# Patient Record
Sex: Female | Born: 1990 | Race: Black or African American | Hispanic: No | Marital: Single | Smoking: Never smoker
Health system: Southern US, Community
[De-identification: ages and names within clinical notes are randomized; demographics above are authoritative.]

## PROBLEM LIST (undated history)

## (undated) DIAGNOSIS — F32A Depression, unspecified: Secondary | ICD-10-CM

## (undated) DIAGNOSIS — U071 COVID-19: Secondary | ICD-10-CM

## (undated) DIAGNOSIS — F4310 Post-traumatic stress disorder, unspecified: Secondary | ICD-10-CM

## (undated) HISTORY — DX: COVID-19: U07.1

## (undated) HISTORY — DX: Post-traumatic stress disorder, unspecified: F43.10

## (undated) HISTORY — PX: BREAST LUMPECTOMY: SHX2

## (undated) HISTORY — DX: Depression, unspecified: F32.A

---

## 2016-06-12 ENCOUNTER — Ambulatory Visit (INDEPENDENT_AMBULATORY_CARE_PROVIDER_SITE_OTHER): Payer: Self-pay

## 2016-06-12 ENCOUNTER — Ambulatory Visit (HOSPITAL_COMMUNITY)
Admission: EM | Admit: 2016-06-12 | Discharge: 2016-06-12 | Disposition: A | Discharge status: Home / Self Care | Payer: Self-pay | Attending: Internal Medicine | Admitting: Internal Medicine

## 2016-06-12 ENCOUNTER — Encounter (HOSPITAL_COMMUNITY): Payer: Self-pay | Admitting: Emergency Medicine

## 2016-06-12 DIAGNOSIS — M79674 Pain in right toe(s): Secondary | ICD-10-CM

## 2016-06-12 DIAGNOSIS — M79671 Pain in right foot: Secondary | ICD-10-CM

## 2016-06-12 NOTE — ED Provider Notes (Signed)
CSN: [id number]     Arrival date & time 06/12/16  1629 History   First MD Initiated Contact with Patient 06/12/16 1658     Chief Complaint  Patient presents with  . Toe Injury   (Consider location/radiation/quality/duration/timing/severity/associated sxs/prior Treatment) HPI Natalie Leblanc is a 26 y.o. female presenting to UC with c/o 4 days of pain to Right 5th toe.  Pt does not recall any specific injury but notes she may have injured it while standing on her toes to reach for something or from "walking silly" while playing with her cats.  Pain is aching and sore, 10/10, worse with palpation.  She has been taking tylenol with some relief. She also noticed some swelling to Right foot but denies pain in her foot or ankle. No other injuries.    History reviewed. No pertinent past medical history. History reviewed. No pertinent surgical history. History reviewed. No pertinent family history. Social History  Substance Use Topics  . Smoking status: Never Smoker  . Smokeless tobacco: Never Used  . Alcohol use No   OB History    No data available     Review of Systems  Musculoskeletal: Positive for arthralgias, gait problem, joint swelling and myalgias.       Right little toe  Skin: Negative for color change and wound.    Allergies  Patient has no known allergies.  Home Medications   Prior to Admission medications   Medication Sig Start Date End Date Taking? Authorizing Provider  sertraline (ZOLOFT) 100 MG tablet Take 100 mg by mouth daily.   Yes Historical Provider, MD   Meds Ordered and Administered this Visit  Medications - No data to display  BP 109/64 (BP Location: Right Arm)   Pulse 97   Temp 98.2 F (36.8 C) (Oral)   LMP 05/29/2016 (Approximate)   SpO2 99%  No data found.   Physical Exam  Constitutional: She is oriented to person, place, and time. She appears well-developed and well-nourished. No distress.  Sitting in exam chair smiling, cooperative during exam.  NAD  HENT:  Head: Normocephalic and atraumatic.  Eyes: EOM are normal.  Neck: Normal range of motion.  Cardiovascular: Normal rate.   Pulses:      Dorsalis pedis pulses are 2+ on the right side.  Pulmonary/Chest: Effort normal.  Musculoskeletal: Normal range of motion. She exhibits edema and tenderness.  Right foot: mild diffuse edema. Tenderness to Little toe. No tenderness to other toes, foot, or ankle. Full ROM ankle and toes.  Neurological: She is alert and oriented to person, place, and time.  Skin: Skin is warm and dry. Capillary refill takes less than 2 seconds. No rash noted. She is not diaphoretic. No erythema.  Right foot: skin in tact. No ecchymosis, erythema, or warmth.  Psychiatric: She has a normal mood and affect. Her behavior is normal.  Nursing note and vitals reviewed.   Urgent Care Course     Procedures (including critical care time)  Labs Review Labs Reviewed - No data to display  Imaging Review Dg Foot Complete Right  Result Date: 06/12/2016 CLINICAL DATA:  26 year old who injured the right fifth toe 4 days ago. Initial encounter. EXAM: RIGHT FOOT COMPLETE - 3+ VIEW COMPARISON:  None. FINDINGS: No evidence of acute fracture or dislocation. Benign bone island in the proximal phalanx of the fifth toe. No significant intrinsic osseous abnormalities. Well-preserved joint spaces. Well-preserved bone mineral density. IMPRESSION: No acute or significant osseous abnormality. Electronically Signed   By: Maisie Fus  Lawrence M.D.   On: 06/12/2016 17:27     MDM   1. Toe pain, right   2. Right foot pain    Pt c/o Right little toe pain. Pt states pain is 10/10, however is smiling and cooperative during exam. Declined tylenol or motrin while in UC.  Plain films: negative for fracture or dislocation  Pain likely due to mild sprain. Toes 4 & 5 strapped together using buddy taping technique Encouraged to keep taking tylenol and motrin. May use ice and elevation. F/u with  PCP in 1-2 weeks if not improving.     Junius FinnerErin O'Malley, PA-C 06/12/16 1756

## 2016-06-12 NOTE — ED Triage Notes (Signed)
Pt states she has been having right pinky toe pain since Sunday.  She states she may have injured it while standing on her toes to reach for something or from "walking silly" playing with her cats.

## 2019-05-05 ENCOUNTER — Ambulatory Visit: Payer: PRIVATE HEALTH INSURANCE | Attending: Internal Medicine

## 2019-05-05 DIAGNOSIS — Z20822 Contact with and (suspected) exposure to covid-19: Secondary | ICD-10-CM | POA: Insufficient documentation

## 2019-05-06 LAB — NOVEL CORONAVIRUS, NAA: SARS-CoV-2, NAA: NOT DETECTED

## 2019-05-12 ENCOUNTER — Telehealth: Payer: Self-pay | Admitting: General Practice

## 2019-05-12 NOTE — Telephone Encounter (Signed)
Patient called in and is requesting results be faxed to employer. Fax number is [phone], attention: Verlon Au. Email sent to manager to have sent over.

## 2019-12-21 ENCOUNTER — Other Ambulatory Visit: Payer: Self-pay

## 2019-12-22 ENCOUNTER — Encounter: Payer: Self-pay | Admitting: Internal Medicine

## 2019-12-22 ENCOUNTER — Other Ambulatory Visit: Payer: Self-pay

## 2019-12-22 ENCOUNTER — Ambulatory Visit (INDEPENDENT_AMBULATORY_CARE_PROVIDER_SITE_OTHER): Payer: PRIVATE HEALTH INSURANCE | Admitting: Internal Medicine

## 2019-12-22 VITALS — BP 98/64 | HR 100 | Temp 98.4°F | Ht 59.0 in | Wt 100.0 lb

## 2019-12-22 DIAGNOSIS — F330 Major depressive disorder, recurrent, mild: Secondary | ICD-10-CM

## 2019-12-22 DIAGNOSIS — M2170 Unequal limb length (acquired), unspecified site: Secondary | ICD-10-CM | POA: Diagnosis not present

## 2019-12-22 DIAGNOSIS — Z23 Encounter for immunization: Secondary | ICD-10-CM | POA: Diagnosis not present

## 2019-12-22 DIAGNOSIS — F4310 Post-traumatic stress disorder, unspecified: Secondary | ICD-10-CM

## 2019-12-22 DIAGNOSIS — R200 Anesthesia of skin: Secondary | ICD-10-CM

## 2019-12-22 DIAGNOSIS — F32A Depression, unspecified: Secondary | ICD-10-CM | POA: Insufficient documentation

## 2019-12-22 DIAGNOSIS — R202 Paresthesia of skin: Secondary | ICD-10-CM

## 2019-12-22 NOTE — Patient Instructions (Signed)
-  Nice seeing you today!!  -Lab work today; will notify you once results are available.  -Flu and tetanus vaccines today.  -Sports medicine referral.  -See you back in 1 year or sooner as needed.

## 2019-12-22 NOTE — Addendum Note (Signed)
Addended by: Kern Reap B on: 12/22/2019 02:23 PM   Modules accepted: Orders

## 2019-12-22 NOTE — Progress Notes (Addendum)
New Patient Office Visit     This visit occurred during the SARS-CoV-2 public health emergency.  Safety protocols were in place, including screening questions prior to the visit, additional usage of staff PPE, and extensive cleaning of exam room while observing appropriate contact time as indicated for disinfecting solutions.    CC/Reason for Visit: Establish care, discuss chronic and acute concerns Previous PCP: None Last Visit: Unknown  HPI: Natalie Leblanc is a 29 y.o. female who is coming in today for the above mentioned reasons. Past Medical History is significant for: Depression, social anxiety, PTSD followed by psychiatry on sertraline 100 mg daily as well as Minipress and hydroxyzine as needed.  She does industrial type work, she has no known drug allergies, no past surgical history, she does not smoke, she drinks alcohol occasionally.  Her family history significant for mother with hypertension and a maternal grandmother with hyperlipidemia.  She suffered an old ankle injury due to sports, she states her mother never took her to the doctor and she believes she has developed a leg length discrepancy that is now causing left-sided knee and hip pain.  She also describes burning and tingling of the soles of her foot that has been getting worse over the past couple months.  She is requesting flu and tetanus vaccines.  She is fully vaccinated against Covid.   Past Medical/Surgical History: Past Medical History:  Diagnosis Date  . Depression   . PTSD (post-traumatic stress disorder)     Past Surgical History:  Procedure Laterality Date  . BREAST LUMPECTOMY Left    benign per patient    Social History:  reports that she has never smoked. She has never used smokeless tobacco. She reports current alcohol use. She reports that she does not use drugs.  Allergies: Allergies  Allergen Reactions  . Fish Allergy     GI upset    Family History:  Family History  Problem Relation  Age of Onset  . Gestational diabetes Mother   . Alcohol abuse Mother   . Mental illness Mother   . Eczema Sister   . Depression Sister   . Psychosis Maternal Aunt   . Thyroid disease Sister   . Depression Sister   . Heart murmur Sister      Current Outpatient Medications:  .  sertraline (ZOLOFT) 100 MG tablet, Take 100 mg by mouth daily., Disp: , Rfl:   Review of Systems:  Constitutional: Denies fever, chills, diaphoresis, appetite change and fatigue.  HEENT: Denies photophobia, eye pain, redness, hearing loss, ear pain, congestion, sore throat, rhinorrhea, sneezing, mouth sores, trouble swallowing, neck pain, neck stiffness and tinnitus.   Respiratory: Denies SOB, DOE, cough, chest tightness,  and wheezing.   Cardiovascular: Denies chest pain, palpitations and leg swelling.  Gastrointestinal: Denies nausea, vomiting, abdominal pain, diarrhea, constipation, blood in stool and abdominal distention.  Genitourinary: Denies dysuria, urgency, frequency, hematuria, flank pain and difficulty urinating.  Endocrine: Denies: hot or cold intolerance, sweats, changes in hair or nails, polyuria, polydipsia. Musculoskeletal: Denies myalgias, back pain, joint swelling, arthralgias and gait problem.  Skin: Denies pallor, rash and wound.  Neurological: Denies dizziness, seizures, syncope, weakness, light-headedness, numbness and headaches.  Hematological: Denies adenopathy. Easy bruising, personal or family bleeding history  Psychiatric/Behavioral: Denies suicidal ideation, mood changes, confusion, nervousness, sleep disturbance and agitation    Physical Exam: Vitals:   12/22/19 1342  BP: 98/64  Pulse: 100  Temp: 98.4 F (36.9 C)  TempSrc: Oral  SpO2: 99%  Weight: 100 lb (45.4 kg)  Height: 4\' 11"  (1.499 m)   Body mass index is 20.2 kg/m.  Constitutional: NAD, calm, comfortable Eyes: PERRL, lids and conjunctivae normal, wears corrective lenses ENMT: Mucous membranes are moist.    Respiratory: clear to auscultation bilaterally, no wheezing, no crackles. Normal respiratory effort. No accessory muscle use.  Cardiovascular: Regular rate and rhythm, no murmurs / rubs / gallops. No extremity edema. Neurologic: Grossly intact and nonfocal Psychiatric: Normal judgment and insight. Alert and oriented x 3. Normal mood.    Impression and Plan:  Mild episode of recurrent major depressive disorder (HCC) PTSD (post-traumatic stress disorder) -Followed by psychiatry on daily sertraline and as needed Minipress and hydroxyzine. -Mood is stable.   Numbness and tingling of both feet  - Plan: TSH, Vitamin B12, VITAMIN D 25 Hydroxy (Vit-D Deficiency, Fractures)  Leg length discrepancy -I will refer to sports medicine, they may be able to put a lift in her shoe.  Need for influenza vaccination -Flu vaccine administered today.  Need for tetanus booster -Tdap administered today.    Patient Instructions  -Nice seeing you today!!  -Lab work today; will notify you once results are available.  -Flu and tetanus vaccines today.  -Sports medicine referral.  -See you back in 1 year or sooner as needed.       , MD [facility]

## 2019-12-23 ENCOUNTER — Other Ambulatory Visit: Payer: Self-pay | Admitting: Internal Medicine

## 2019-12-23 ENCOUNTER — Encounter: Payer: Self-pay | Admitting: Internal Medicine

## 2019-12-23 DIAGNOSIS — E559 Vitamin D deficiency, unspecified: Secondary | ICD-10-CM

## 2019-12-23 LAB — TSH: TSH: 1.14 mIU/L

## 2019-12-23 LAB — VITAMIN D 25 HYDROXY (VIT D DEFICIENCY, FRACTURES): Vit D, 25-Hydroxy: 21 ng/mL — ABNORMAL LOW (ref 30–100)

## 2019-12-23 LAB — VITAMIN B12: Vitamin B-12: 866 pg/mL (ref 200–1100)

## 2019-12-23 MED ORDER — VITAMIN D (ERGOCALCIFEROL) 1.25 MG (50000 UNIT) PO CAPS: 50000.0000 [IU] | ORAL_CAPSULE | ORAL | 0 refills | Status: AC

## 2019-12-28 ENCOUNTER — Encounter: Payer: Self-pay | Admitting: Family Medicine

## 2019-12-28 ENCOUNTER — Ambulatory Visit (INDEPENDENT_AMBULATORY_CARE_PROVIDER_SITE_OTHER): Payer: PRIVATE HEALTH INSURANCE | Admitting: Family Medicine

## 2019-12-28 ENCOUNTER — Ambulatory Visit: Payer: Self-pay

## 2019-12-28 ENCOUNTER — Other Ambulatory Visit: Payer: Self-pay

## 2019-12-28 ENCOUNTER — Ambulatory Visit (INDEPENDENT_AMBULATORY_CARE_PROVIDER_SITE_OTHER): Payer: PRIVATE HEALTH INSURANCE

## 2019-12-28 VITALS — BP 96/64 | HR 91 | Ht 59.0 in | Wt 101.0 lb

## 2019-12-28 DIAGNOSIS — M25572 Pain in left ankle and joints of left foot: Secondary | ICD-10-CM | POA: Diagnosis not present

## 2019-12-28 DIAGNOSIS — G8929 Other chronic pain: Secondary | ICD-10-CM

## 2019-12-28 DIAGNOSIS — M76822 Posterior tibial tendinitis, left leg: Secondary | ICD-10-CM | POA: Diagnosis not present

## 2019-12-28 NOTE — Progress Notes (Signed)
Subjective:    I'm seeing this patient as a consultation for:  Natalie Leblanc. Note will be routed back to referring provider/PCP.  CC: L ankle  I, Natalie Leblanc, LAT, ATC, am serving as scribe for Natalie Leblanc.  HPI: Pt is a 29 y/o female presenting w/ c/o chronic L ankle pain after having multiple ankle injuries in HS and college.  She is not having pain currently in her L ankle but when she does have pain it's along her L medial rearfoot.  She locates the pain to the navicular prominence at her left midfoot.  She has tried some over-the-counter ankle braces in the past with some benefit.  She has never had trial of physical therapy.  Swelling: yes, intermittently in both feet Aggravating factors: quick motions of her foot and ankle; prolonged standing and walking; Treatments tried: Nothing  Past medical history, Surgical history, Family history, Social history, Allergies, and medications have been entered into the medical record, reviewed.   Review of Systems: No new headache, visual changes, nausea, vomiting, diarrhea, constipation, dizziness, abdominal pain, skin rash, fevers, chills, night sweats, weight loss, swollen lymph nodes, body aches, joint swelling, muscle aches, chest pain, shortness of breath, mood changes, visual or auditory hallucinations.   Objective:    Vitals:   12/28/19 1343  BP: 96/64  Pulse: 91  SpO2: 98%   General: Well Developed, well nourished, and in no acute distress.  Neuro/Psych: Alert and oriented x3, extra-ocular muscles intact, able to move all 4 extremities, sensation grossly intact. Skin: Warm and dry, no rashes noted.  Respiratory: Not using accessory muscles, speaking in full sentences, trachea midline.  Cardiovascular: Pulses palpable, no extremity edema. Abdomen: Does not appear distended. MSK: Feet bilaterally with pronation.  Left foot otherwise normal-appearing Tender palpation mildly at navicular prominence.  Some pain with  resisted foot inversion. Stable ligamentous exam.  Pulses cap refill and sensation are intact distally.  Lab and Radiology Results X-ray images left foot obtained today personally and independently reviewed Normal-appearing midfoot with no significant degenerative changes or fractures. Await formal radiology review  Diagnostic Limited MSK Ultrasound of: Left medial foot and ankle Normal-appearing posterior tibialis tendon at ankle and to insertion.  Insertion of the navicular prominence slight cortical defect present with increased Doppler activity indicating cortical irritation at insertion site. 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 female with left posterior tibialis tendinitis at insertion.  This is compounded by pronation deformity bilaterally.  Plan to treat multifactorially with Voltaren gel compressive ankle sleeve scaphoid pads and home eccentric exercises taught in clinic today by ATC.  Recheck in 1 month.  If not better would proceed with physical therapy and probably nitroglycerin patch protocol.  Consider MRI in future if needed.Marland Kitchen  PDMP not reviewed this encounter. Orders Placed This Encounter  Procedures  . [place] LIMITED JOINT SPACE STRUCTURES LOW LEFT(NO LINKED CHARGES)    Order Specific Question:   Reason for Exam (SYMPTOM  OR DIAGNOSIS REQUIRED)    Answer:   L medial foot pain    Order Specific Question:   Preferred imaging location?    Answer:   [facility]  . DG Foot Complete Left    Standing Status:   Future    Number of Occurrences:   1    Standing Expiration Date:   12/27/2020    Order Specific Question:   Reason for Exam (SYMPTOM  OR DIAGNOSIS REQUIRED)    Answer:   eval foot pain navicular    Order Specific Question:   Is patient pregnant?    Answer:   No    Order Specific Question:   Preferred imaging location?    Answer:    [facility]    Order Specific Question:   Radiology Contrast Protocol - do NOT remove file path    Answer:   [email or web address]   No orders of the defined types were placed in this encounter.   Discussed warning signs or symptoms. Please see discharge instructions. Patient expresses understanding.   The above documentation has been reviewed and is accurate and complete Clementeen Leblanc, M.D.

## 2019-12-28 NOTE — Patient Instructions (Addendum)
Thank you for coming in today.  Please get an Xray today before you leave  Use scaphoid pads  (small) from [employer]pad.com  Please use voltaren gel up to 4x daily for pain as needed.   Please perform the exercise program that we have prepared for you and gone over in detail on a daily basis.  In addition to the handout you were provided you can access your program through: [email or web address]-exercise-[email or web address]   Your unique program code is:  [id number]  I recommend you obtained a compression sleeve to help with your joint problems. There are many options on the market however I recommend obtaining a full ankle Body Helix compression sleeve.  You can find information (including how to appropriate measure yourself for sizing) can be found at www.Body [email or web address].  Many of these products are health savings account (HSA) eligible.   Recheck in about 1 month.   If not better can proceed to physical therapy and possibly get MRI.

## 2019-12-30 NOTE — Progress Notes (Signed)
X-ray left foot looks normal to radiology

## 2020-01-27 ENCOUNTER — Ambulatory Visit: Payer: PRIVATE HEALTH INSURANCE | Admitting: Family Medicine

## 2020-01-27 NOTE — Progress Notes (Deleted)
   I, Natalie Leblanc, LAT, ATC, am serving as scribe for Dr. [name].  Natalie Leblanc is a 29 y.o. female who presents to [facility] today for f/u of L ankle pain.  She was last seen by Dr. Denyse Leblanc on 12/28/19 and was provided scaphoid pads, a HEP consisting of ankle inversion eccentrics and advised to use Voltaren gel.  Since her last visit, pt reports  Diagnostic testing: L foot XR- 12/28/19   Pertinent review of systems: ***  Relevant historical information: ***   Exam:  There were no vitals taken for this visit. General: Well Developed, well nourished, and in no acute distress.   MSK: ***    Lab and Radiology Results No results found for this or any previous visit (from the past 72 hour(s)). No results found.     Assessment and Plan: 29 y.o. female with ***   PDMP not reviewed this encounter. No orders of the defined types were placed in this encounter.  No orders of the defined types were placed in this encounter.    Discussed warning signs or symptoms. Please see discharge instructions. Patient expresses understanding.   ***

## 2020-02-01 ENCOUNTER — Ambulatory Visit: Payer: PRIVATE HEALTH INSURANCE | Admitting: Family Medicine

## 2020-02-01 NOTE — Progress Notes (Deleted)
   I, [name], LAT, ATC, am serving as scribe for Dr. [name].  Natalie Leblanc is a 29 y.o. female who presents to [facility] today for f/u of L medial midfoot pain.  She was last seen by Dr. Denyse Amass on 12/28/19 and was provided a HEP focusing on ankle inversion eccentrics.  She was also given scaphoid pads for her shoes and advised to use Voltaren gel.  Since her last visit, pt reports  Diagnostic testing: L foot XR- 12/28/19   Pertinent review of systems: ***  Relevant historical information: ***   Exam:  There were no vitals taken for this visit. General: Well Developed, well nourished, and in no acute distress.   MSK: ***    Lab and Radiology Results No results found for this or any previous visit (from the past 72 hour(s)). No results found.     Assessment and Plan: 29 y.o. female with ***   PDMP not reviewed this encounter. No orders of the defined types were placed in this encounter.  No orders of the defined types were placed in this encounter.    Discussed warning signs or symptoms. Please see discharge instructions. Patient expresses understanding.   ***

## 2020-03-22 ENCOUNTER — Other Ambulatory Visit: Payer: Self-pay | Admitting: Internal Medicine

## 2020-03-22 DIAGNOSIS — E559 Vitamin D deficiency, unspecified: Secondary | ICD-10-CM

## 2020-05-06 ENCOUNTER — Emergency Department (HOSPITAL_COMMUNITY): Payer: No Typology Code available for payment source

## 2020-05-06 ENCOUNTER — Encounter (HOSPITAL_COMMUNITY): Payer: Self-pay | Admitting: Emergency Medicine

## 2020-05-06 ENCOUNTER — Emergency Department (HOSPITAL_COMMUNITY)
Admission: EM | Admit: 2020-05-06 | Discharge: 2020-05-06 | Disposition: A | Discharge status: Home / Self Care | Payer: No Typology Code available for payment source | Attending: Emergency Medicine | Admitting: Emergency Medicine

## 2020-05-06 ENCOUNTER — Other Ambulatory Visit: Payer: Self-pay

## 2020-05-06 DIAGNOSIS — Z20822 Contact with and (suspected) exposure to covid-19: Secondary | ICD-10-CM | POA: Diagnosis not present

## 2020-05-06 DIAGNOSIS — R079 Chest pain, unspecified: Secondary | ICD-10-CM | POA: Diagnosis present

## 2020-05-06 DIAGNOSIS — R0789 Other chest pain: Secondary | ICD-10-CM | POA: Insufficient documentation

## 2020-05-06 LAB — CBC
HCT: 42.1 % (ref 36.0–46.0)
Hemoglobin: 13.2 g/dL (ref 12.0–15.0)
MCH: 28 pg (ref 26.0–34.0)
MCHC: 31.4 g/dL (ref 30.0–36.0)
MCV: 89.4 fL (ref 80.0–100.0)
Platelets: 270 10*3/uL (ref 150–400)
RBC: 4.71 MIL/uL (ref 3.87–5.11)
RDW: 14 % (ref 11.5–15.5)
WBC: 5.1 10*3/uL (ref 4.0–10.5)
nRBC: 0 % (ref 0.0–0.2)

## 2020-05-06 LAB — BASIC METABOLIC PANEL
Anion gap: 9 (ref 5–15)
BUN: 16 mg/dL (ref 6–20)
CO2: 25 mmol/L (ref 22–32)
Calcium: 9.4 mg/dL (ref 8.9–10.3)
Chloride: 103 mmol/L (ref 98–111)
Creatinine, Ser: 0.86 mg/dL (ref 0.44–1.00)
GFR, Estimated: >60 mL/min (ref >60)
Glucose, Bld: 87 mg/dL (ref 70–99)
Potassium: 3.7 mmol/L (ref 3.5–5.1)
Sodium: 137 mmol/L (ref 135–145)

## 2020-05-06 LAB — I-STAT BETA HCG BLOOD, ED (NOT ORDERABLE): I-stat hCG, quantitative: <5 m[IU]/mL (ref <5)

## 2020-05-06 LAB — TROPONIN I (HIGH SENSITIVITY): Troponin I (High Sensitivity): <2 ng/L (ref <18)

## 2020-05-06 NOTE — ED Provider Notes (Signed)
[facility]   CSN: [id number] Arrival date & time: 05/06/20  1549     History Chief Complaint  Patient presents with  . Chest Pain    Natalie Leblanc is a 30 y.o. female with past medical history significant for depression, PTSD.  Had COVID vaccinations.  HPI Patient presents to emergency department today with chief complaint of chest pain x3 days.  Chest pain has been intermittent.  It is central and she describes it as a squeezing sensation of her heart.  She states she has a history of anxiety however this pain feels different and she denies any increased anxiety currently..  She denies worsening pain with exertion although states that when she has the pain she feels short of breath.  She has tried multiple interventions for chest pain without symptom improvement.  At first she thought she was hungry so she tried eating a small meal.  She then tried taking Tums.  She has been taking ibuprofen.  She rates the pain a 6 out of 10 in severity.  She occasionally drinks a wine cooler, states she has only had a 1 in the last week.  Denies any drug use.  She does admit to lifting heavy boxes at work in the last week in her bilateral arms feel sore.  She states multiple coworkers have been out with Covid however does not think she has had close contact with anyone that is Covid positive.  She denies any recent illness. Denies any family history of cardiac disease.  She denies any fever, chills, cough, hemoptysis, syncope, palpitations, abdominal pain, nausea or emesis, back pain.  Denies PE risk factors including  exogenous estrogen use, lower extremity pain or swelling, recent travel or immobilization, history of PE or DVT, family or personal history of bleeding or clotting disorders, cough or hemoptysis.     Past Medical History:  Diagnosis Date  . Depression   . PTSD (post-traumatic stress disorder)     Patient Active Problem List    Diagnosis Date Noted  . 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 12/28/2019  . Vitamin D deficiency 12/23/2019  . Depression   . PTSD (post-traumatic stress disorder)     Past Surgical History:  Procedure Laterality Date  . BREAST LUMPECTOMY Left    benign per patient     OB History   No obstetric history on file.     Family History  Problem Relation Age of Onset  . Gestational diabetes Mother   . Alcohol abuse Mother   . Mental illness Mother   . Eczema Sister   . Depression Sister   . Psychosis Maternal Aunt   . Thyroid disease Sister   . Depression Sister   . Heart murmur Sister     Social History   Tobacco Use  . Smoking status: Never Smoker  . Smokeless tobacco: Never Used  Vaping Use  . Vaping Use: Never used  Substance Use Topics  . Alcohol use: Yes    Comment: occasional  . Drug use: No    Home Medications Prior to Admission medications   Medication Sig Start Date End Date Taking? Authorizing Provider  sertraline (ZOLOFT) 100 MG tablet Take 100 mg by mouth daily.    [provider]    Allergies    Fish allergy  Review of Systems   Review of Systems All other systems are reviewed and are negative for acute change except as noted in the HPI.  Physical Exam Updated Vital Signs BP 112/80 (BP Location: Left Arm)   Pulse 70   Temp 98.1 F (36.7 C) (Oral)   Resp 16   LMP 04/28/2020   SpO2 100%   Physical Exam Vitals and nursing note reviewed.  Constitutional:      General: She is not in acute distress.    Appearance: She is not ill-appearing.  HENT:     Head: Normocephalic and atraumatic.     Right Ear: Tympanic membrane and external ear normal.     Left Ear: Tympanic membrane and external ear normal.     Nose: Nose normal.     Mouth/Throat:     Mouth: Mucous membranes are moist.     Pharynx: Oropharynx is clear.  Eyes:     General: No scleral icterus.       Right eye: No discharge.        Left eye: No discharge.      Extraocular Movements: Extraocular movements intact.     Conjunctiva/sclera: Conjunctivae normal.     Pupils: Pupils are equal, round, and reactive to light.  Neck:     Vascular: No JVD.  Cardiovascular:     Rate and Rhythm: Normal rate and regular rhythm.     Pulses: Normal pulses.          Radial pulses are 2+ on the right side and 2+ on the left side.     Heart sounds: Normal heart sounds.  Pulmonary:     Comments: Lungs clear to auscultation in all fields. Symmetric chest rise. No wheezing, rales, or rhonchi. Chest:     Chest wall: Tenderness present.  Abdominal:     Comments: Abdomen is soft, non-distended, and non-tender in all quadrants. No rigidity, no guarding. No peritoneal signs.  Musculoskeletal:        General: Normal range of motion.     Cervical back: Normal range of motion.     Right lower leg: No edema.     Left lower leg: No edema.  Skin:    General: Skin is warm and dry.     Capillary Refill: Capillary refill takes less than 2 seconds.  Neurological:     Mental Status: She is oriented to person, place, and time.     GCS: GCS eye subscore is 4. GCS verbal subscore is 5. GCS motor subscore is 6.     Comments: Fluent speech, no facial droop.  Psychiatric:        Behavior: Behavior normal.     ED Results / Procedures / Treatments   Labs (all labs ordered are listed, but only abnormal results are displayed) Labs Reviewed  SARS CORONAVIRUS 2 (TAT 6-24 HRS)  BASIC METABOLIC PANEL  CBC  I-STAT BETA HCG BLOOD, ED ([facility], [facility], [facility] ONLY)  I-STAT BETA HCG BLOOD, ED (NOT ORDERABLE)  TROPONIN I (HIGH SENSITIVITY)    EKG None  Radiology DG Chest 2 View  Result Date: 05/06/2020 CLINICAL DATA:  Chest pain with shortness of breath for 3 days. EXAM: CHEST - 2 VIEW COMPARISON:  None. FINDINGS: Heart size and mediastinal contours are within normal limits. Lungs are clear. No pleural effusion or pneumothorax is seen. Osseous structures about the chest are unremarkable.  IMPRESSION: Normal chest x-ray. Electronically Signed   By: Bary Richard M.D.   On: 05/06/2020 16:27    Procedures Procedures   Medications Ordered in ED Medications - No data to display  ED Course  I have reviewed the triage vital  signs and the nursing notes.  Pertinent labs & imaging results that were available during my care of the patient were reviewed by me and considered in my medical decision making (see chart for details).    MDM Rules/Calculators/A&P                          History provided by patient with additional history obtained from chart review.    Patient presents to the emergency department with chest pain. Patient nontoxic appearing, in no apparent distress, vitals without significant abnormality. Fairly benign physical exam. DDX: ACS, pulmonary embolism, dissection, pneumothorax, effusion, infiltrate, arrhythmia, anemia, electrolyte derangement, MSK. Evaluation initiated with labs, EKG, and CXR. Patient on cardiac monitor.   Work-up in the ER unremarkable. Labs reviewed, no leukocytosis, anemia, or significant electrolyte abnormality.  Pregnancy test is negative.  CXR without infiltrate, effusion, pneumothorax, or fracture/dislocation.   Low risk heart score of 0, EKG without obvious ischemia, troponin negative, doubt ACS. Patient is low risk wells, PERC negative, doubt pulmonary embolism. Pain is not a tearing sensation, symmetric pulses, no widening of mediastinum on CXR, doubt dissection. Cardiac monitor reviewed, no notable arrhythmias or tachycardia.  She has multiple coworkers with Covid, Covid test performed and is process at time of discharge.  Recommended she isolate until she has test result.  Discussed treatment if test is positive. She declines need for NSAID or anything for pain while in the ED. Patient has appeared hemodynamically stable throughout ER visit and appears safe for discharge with close pcp follow up. I discussed results, treatment plan, need  for PCP and cardiology  follow-up, and return precautions with the patient. Provided opportunity for questions, patient confirmed understanding and is in agreement with plan.  The patient was discussed with and seen by ED supervising physician Dr. Rodena Medin who agrees with the treatment plan.    Portions of this note were generated with Scientist, clinical (histocompatibility and immunogenetics). Dictation errors may occur despite best attempts at proofreading.    Final Clinical Impression(s) / ED Diagnoses Final diagnoses:  Atypical chest pain    Rx / DC Orders ED Discharge Orders    None       Kandice Hams 05/06/20 1913    Wynetta Fines, MD 05/06/20 2317

## 2020-05-06 NOTE — ED Triage Notes (Signed)
Patient c/o intermittent central chest pain with SOB x3 days. Reports hx chest pains with stress but states she is not stressed at this time.

## 2020-05-06 NOTE — Discharge Instructions (Signed)
-  Your Covid test is in process.  You should isolate until you have the test result.  If it is positive you need to quarantine per CDC guidelines.  If it is negative you are free to return to normal activity.  -Follow-up with your primary care doctor and cardiology.  I given you the information for [facility].  You can call their office to schedule follow-up appointment.  -You can continue taking ibuprofen to see if that helps your pain.  Take as directed on the bottle.  Return to the emergency room if you have any new or worsening symptoms.

## 2020-05-07 LAB — SARS CORONAVIRUS 2 (TAT 6-24 HRS): SARS Coronavirus 2: NEGATIVE

## 2020-05-11 ENCOUNTER — Encounter: Payer: Self-pay | Admitting: Internal Medicine

## 2020-05-11 ENCOUNTER — Telehealth (INDEPENDENT_AMBULATORY_CARE_PROVIDER_SITE_OTHER): Payer: PRIVATE HEALTH INSURANCE | Admitting: Internal Medicine

## 2020-05-11 VITALS — Wt 100.0 lb

## 2020-05-11 DIAGNOSIS — K219 Gastro-esophageal reflux disease without esophagitis: Secondary | ICD-10-CM

## 2020-05-11 DIAGNOSIS — Z09 Encounter for follow-up examination after completed treatment for conditions other than malignant neoplasm: Secondary | ICD-10-CM | POA: Diagnosis not present

## 2020-05-11 DIAGNOSIS — R079 Chest pain, unspecified: Secondary | ICD-10-CM

## 2020-05-11 MED ORDER — PANTOPRAZOLE SODIUM 40 MG PO TBEC: 40.0000 mg | DELAYED_RELEASE_TABLET | Freq: Every day | ORAL | 1 refills | Status: DC

## 2020-05-11 NOTE — Progress Notes (Signed)
Virtual Visit via Telephone Note  I connected with Natalie Leblanc on 05/11/20 at  3:30 PM EST by telephone and verified that I am speaking with the correct person using two identifiers.   I discussed the limitations, risks, security and privacy concerns of performing an evaluation and management service by telephone and the availability of in person appointments. I also discussed with the patient that there may be a patient responsible charge related to this service. The patient expressed understanding and agreed to proceed.  Location patient: home Location provider: work office Participants present for the call: patient, provider Patient did not have a visit in the prior 7 days to address this/these issue(s).   History of Present Illness:  She was seen in the emergency department on 1/30 with complaints of 3 days of chest pain.  Pain has been intermittent.  She tells me today that the pain is worse when she is hungry or lying flat.  Feels like a "gnawing pain".  She has a history of anxiety but does not feel like this is acting up at this time.  She tried over-the-counter Tums without relief.  She has been taking ibuprofen.  Work-up in the ED was negative including chest x-ray, EKG, troponins and labs.  It was recommended that she follow-up with me.   Observations/Objective: Patient sounds cheerful and well on the phone. I do not appreciate any increased work of breathing. Speech and thought processing are grossly intact. Patient reported vitals: None reported   Current Outpatient Medications:  .  pantoprazole (PROTONIX) 40 MG tablet, Take 1 tablet (40 mg total) by mouth daily., Disp: 90 tablet, Rfl: 1 .  sertraline (ZOLOFT) 100 MG tablet, Take 100 mg by mouth daily., Disp: , Rfl:   Review of Systems:  Constitutional: Denies fever, chills, diaphoresis, appetite change and fatigue.  HEENT: Denies photophobia, eye pain, redness, hearing loss, ear pain, congestion, sore  throat, rhinorrhea, sneezing, mouth sores, trouble swallowing, neck pain, neck stiffness and tinnitus.   Respiratory: Denies SOB, DOE, cough  and wheezing.   Cardiovascular: Denies palpitations and leg swelling.  Gastrointestinal: Denies nausea, vomiting, abdominal pain, diarrhea, constipation, blood in stool and abdominal distention.  Genitourinary: Denies dysuria, urgency, frequency, hematuria, flank pain and difficulty urinating.  Endocrine: Denies: hot or cold intolerance, sweats, changes in hair or nails, polyuria, polydipsia. Musculoskeletal: Denies myalgias, back pain, joint swelling, arthralgias and gait problem.  Skin: Denies pallor, rash and wound.  Neurological: Denies dizziness, seizures, syncope, weakness, light-headedness, numbness and headaches.  Hematological: Denies adenopathy. Easy bruising, personal or family bleeding history  Psychiatric/Behavioral: Denies suicidal ideation, mood changes, confusion, nervousness, sleep disturbance and agitation   Assessment and Plan:  Hospital discharge follow-up  Chest pain, unspecified type  Gastroesophageal reflux disease without esophagitis  -I think that pretest probability for cardiac disease would be very low in this young lady without any personal or family risk factors who had a completely normal cardiac work-up in the ED.  I believe referral to cardiology that she is requesting is probably not necessary at this time although we can consider if issues persist. -Description of pain makes me believe that it might be GERD.  We will do an 8-week trial of Protonix 40 mg daily to see if this alleviates her symptoms. -She wonders about gallbladder as her mother had similar issues and ended up having a cholecystectomy, this remains in the differential as well although not highly suspicious for at this time.   I discussed the  assessment and treatment plan with the patient. The patient was provided an opportunity to ask questions and all  were answered. The patient agreed with the plan and demonstrated an understanding of the instructions.   The patient was advised to call back or seek an in-person evaluation if the symptoms worsen or if the condition fails to improve as anticipated.  I provided 21 minutes of non-face-to-face time during this encounter.   Chaya Jan, MD [facility] at [facility]

## 2020-12-24 ENCOUNTER — Other Ambulatory Visit: Payer: Self-pay

## 2020-12-25 ENCOUNTER — Ambulatory Visit (INDEPENDENT_AMBULATORY_CARE_PROVIDER_SITE_OTHER): Payer: PRIVATE HEALTH INSURANCE | Admitting: Internal Medicine

## 2020-12-25 ENCOUNTER — Encounter: Payer: Self-pay | Admitting: Internal Medicine

## 2020-12-25 ENCOUNTER — Other Ambulatory Visit (HOSPITAL_COMMUNITY)
Admission: RE | Admit: 2020-12-25 | Discharge: 2020-12-25 | Disposition: A | Discharge status: Home / Self Care | Payer: 59 | Source: Ambulatory Visit | Attending: Internal Medicine | Admitting: Internal Medicine

## 2020-12-25 VITALS — BP 102/68 | HR 68 | Temp 98.4°F | Ht 59.5 in | Wt 103.9 lb

## 2020-12-25 DIAGNOSIS — Z124 Encounter for screening for malignant neoplasm of cervix: Secondary | ICD-10-CM | POA: Diagnosis present

## 2020-12-25 DIAGNOSIS — F330 Major depressive disorder, recurrent, mild: Secondary | ICD-10-CM | POA: Diagnosis not present

## 2020-12-25 DIAGNOSIS — Z23 Encounter for immunization: Secondary | ICD-10-CM

## 2020-12-25 DIAGNOSIS — F4310 Post-traumatic stress disorder, unspecified: Secondary | ICD-10-CM | POA: Diagnosis not present

## 2020-12-25 DIAGNOSIS — Z0000 Encounter for general adult medical examination without abnormal findings: Secondary | ICD-10-CM

## 2020-12-25 DIAGNOSIS — K219 Gastro-esophageal reflux disease without esophagitis: Secondary | ICD-10-CM | POA: Diagnosis not present

## 2020-12-25 NOTE — Progress Notes (Signed)
Established Patient Office Visit     This visit occurred during the SARS-CoV-2 public health emergency.  Safety protocols were in place, including screening questions prior to the visit, additional usage of staff PPE, and extensive cleaning of exam room while observing appropriate contact time as indicated for disinfecting solutions.    CC/Reason for Visit: Annual preventive exam  HPI: Natalie Leblanc is a 30 y.o. female who is coming in today for the above mentioned reasons. Past Medical History is significant for: Depression and posttraumatic stress followed by psychiatry maintained on sertraline.  She had an emergency department visit earlier this year for chest pain, was discharged after negative work-up.  At her follow-up visit we had determined that it was likely GERD based on symptoms.  She only took Protonix for a few weeks and discontinued.  She prefers not to take medication if possible.  She states that she still has this epigastric/chest pain that is worse when lying down flat and feels like a " gnawing sensation".  She has routine dental care but no eye care, she is overdue for Pap smear.  She is due for flu and COVID booster.   Past Medical/Surgical History: Past Medical History:  Diagnosis Date   Depression    PTSD (post-traumatic stress disorder)     Past Surgical History:  Procedure Laterality Date   BREAST LUMPECTOMY Left    benign per patient    Social History:  reports that she has never smoked. She has never used smokeless tobacco. She reports current alcohol use. She reports that she does not use drugs.  Allergies: Allergies  Allergen Reactions   Animal Chews Itching   Fish Allergy     GI upset    Family History:  Family History  Problem Relation Age of Onset   Gestational diabetes Mother    Alcohol abuse Mother    Mental illness Mother    Eczema Sister    Depression Sister    Psychosis Maternal Aunt    Thyroid disease Sister     Depression Sister    Heart murmur Sister      Current Outpatient Medications:    sertraline (ZOLOFT) 100 MG tablet, Take 100 mg by mouth daily., Disp: , Rfl:    Multiple Vitamin (MULTIVITAMIN) capsule, Take 1 capsule by mouth daily., Disp: , Rfl:    pantoprazole (PROTONIX) 40 MG tablet, Take 1 tablet (40 mg total) by mouth daily. (Patient not taking: Reported on 12/25/2020), Disp: 90 tablet, Rfl: 1  Review of Systems:  Constitutional: Denies fever, chills, diaphoresis, appetite change and fatigue.  HEENT: Denies photophobia, eye pain, redness, hearing loss, ear pain, congestion, sore throat, rhinorrhea, sneezing, mouth sores, trouble swallowing, neck pain, neck stiffness and tinnitus.   Respiratory: Denies SOB, DOE, cough, chest tightness,  and wheezing.   Cardiovascular: Denies chest pain, palpitations and leg swelling.  Gastrointestinal: Denies diarrhea, constipation, blood in stool and abdominal distention.  Genitourinary: Denies dysuria, urgency, frequency, hematuria, flank pain and difficulty urinating.  Endocrine: Denies: hot or cold intolerance, sweats, changes in hair or nails, polyuria, polydipsia. Musculoskeletal: Denies myalgias, back pain, joint swelling, arthralgias and gait problem.  Skin: Denies pallor, rash and wound.  Neurological: Denies dizziness, seizures, syncope, weakness, light-headedness, numbness and headaches.  Hematological: Denies adenopathy. Easy bruising, personal or family bleeding history  Psychiatric/Behavioral: Denies suicidal ideation, mood changes, confusion, nervousness, sleep disturbance and agitation    Physical Exam: Vitals:   12/25/20 1032  BP: 102/68  Pulse: 68  Temp: 98.4 F (36.9 C)  TempSrc: Oral  SpO2: 99%  Weight: 103 lb 14.4 oz (47.1 kg)  Height: 4' 11.5" (1.511 m)    Body mass index is 20.63 kg/m.   Constitutional: NAD, calm, comfortable Eyes: PERRL, lids and conjunctivae normal ENMT: Mucous membranes are moist.   Respiratory: clear to auscultation bilaterally, no wheezing, no crackles. Normal respiratory effort. No accessory muscle use.  Cardiovascular: Regular rate and rhythm, no murmurs / rubs / gallops. No extremity edema. 2+ pedal pulses. No carotid bruits.  Abdomen: no tenderness, no masses palpated. No hepatosplenomegaly. Bowel sounds positive.  Musculoskeletal: no clubbing / cyanosis. No joint deformity upper and lower extremities. Good ROM, no contractures. Normal muscle tone.  Skin: no rashes, lesions, ulcers. No induration Neurologic: CN 2-12 grossly intact. Sensation intact, DTR normal. Strength 5/5 in all 4.  Psychiatric: Normal judgment and insight. Alert and oriented x 3. Normal mood.    Impression and Plan:  Encounter for preventive health examination -She has routine dental care, have advised routine eye care. -She will get flu vaccine today, she is overdue COVID booster which she will need to get at pharmacy. -Healthy lifestyle discussed in detail. -Pap smear done in office today. -Commence routine breast cancer screening age 54 and colon cancer screening age 47  Mild episode of recurrent major depressive disorder (HCC) PTSD (post-traumatic stress disorder) -Followed by psychiatry on sertraline.  Gastroesophageal reflux disease without esophagitis  - Plan: Ambulatory referral to Gastroenterology -She declines PPI therapy.  Need for influenza vaccination -Flu vaccine administered today.    Patient Instructions  -Nice seeing you today!!  -Flu vaccine today.  -Schedule follow up in 1 year or sooner as needed.     Chaya Jan, MD [facility] at [facility]

## 2020-12-25 NOTE — Patient Instructions (Signed)
-  Nice seeing you today!!  -Flu vaccine today.  -Schedule follow up in 1 year or sooner as needed.   

## 2020-12-25 NOTE — Addendum Note (Signed)
Addended by: Kern Reap B on: 12/25/2020 11:53 AM   Modules accepted: Orders

## 2020-12-25 NOTE — Addendum Note (Signed)
Addended by: Kern Reap B on: 12/25/2020 05:02 PM   Modules accepted: Orders

## 2020-12-26 LAB — CYTOLOGY - PAP
Adequacy: ABSENT
Comment: NEGATIVE
Diagnosis: UNDETERMINED — AB
High risk HPV: NEGATIVE

## 2020-12-27 ENCOUNTER — Encounter: Payer: Self-pay | Admitting: Internal Medicine

## 2021-02-12 ENCOUNTER — Encounter (HOSPITAL_COMMUNITY): Payer: Self-pay

## 2021-02-12 ENCOUNTER — Ambulatory Visit (HOSPITAL_COMMUNITY)
Admission: EM | Admit: 2021-02-12 | Discharge: 2021-02-12 | Disposition: A | Discharge status: Home / Self Care | Payer: 59 | Attending: Urgent Care | Admitting: Urgent Care

## 2021-02-12 ENCOUNTER — Other Ambulatory Visit: Payer: Self-pay

## 2021-02-12 DIAGNOSIS — L249 Irritant contact dermatitis, unspecified cause: Secondary | ICD-10-CM | POA: Diagnosis not present

## 2021-02-12 MED ORDER — TRIAMCINOLONE ACETONIDE 0.1 % EX CREA: 1.0000 "application " | TOPICAL_CREAM | Freq: Two times a day (BID) | CUTANEOUS | 0 refills | Status: DC

## 2021-02-12 NOTE — ED Provider Notes (Signed)
[facility]   MRN: [id number] DOB: 12/19/1990  Subjective:   Natalie Leblanc is a 30 y.o. female presenting for 1 week history of persistent itchy spot over the left arm.  Patient has used multiple over-the-counter measures denies fever, drainage of pus or bleeding.  Patient initially thought it was a chemical burn but cannot member anything specific.  She does have a cat and had any particular allergies to it.  She did use a new oatmeal type soap recall any other new exposures.  No current facility-administered medications for this encounter.  Current Outpatient Medications:    Multiple Vitamin (MULTIVITAMIN) capsule, Take 1 capsule by mouth daily., Disp: , Rfl:    pantoprazole (PROTONIX) 40 MG tablet, Take 1 tablet (40 mg total) by mouth daily. (Patient not taking: Reported on 12/25/2020), Disp: 90 tablet, Rfl: 1   sertraline (ZOLOFT) 100 MG tablet, Take 100 mg by mouth daily., Disp: , Rfl:    Allergies  Allergen Reactions   Animal Chews Itching   Fish Allergy     GI upset    Past Medical History:  Diagnosis Date   Depression    PTSD (post-traumatic stress disorder)      Past Surgical History:  Procedure Laterality Date   BREAST LUMPECTOMY Left    benign per patient    Family History  Problem Relation Age of Onset   Gestational diabetes Mother    Alcohol abuse Mother    Mental illness Mother    Eczema Sister    Depression Sister    Psychosis Maternal Aunt    Thyroid disease Sister    Depression Sister    Heart murmur Sister     Social History   Tobacco Use   Smoking status: Never   Smokeless tobacco: Never  Vaping Use   Vaping Use: Never used  Substance Use Topics   Alcohol use: Yes    Comment: occasional   Drug use: No    ROS   Objective:   Vitals: BP 121/74 (BP Location: Left Arm)   Pulse 70   Temp 98.1 F (36.7 C) (Oral)   Resp 18   LMP 02/09/2021 (Exact Date)   SpO2 98%   Physical Exam Constitutional:       General: She is not in acute distress.    Appearance: Normal appearance. She is well-developed. She is not ill-appearing.  HENT:     Head: Normocephalic and atraumatic.     Nose: Nose normal.     Mouth/Throat:     Mouth: Mucous membranes are moist.     Pharynx: Oropharynx is clear.  Eyes:     General: No scleral icterus.    Extraocular Movements: Extraocular movements intact.     Pupils: Pupils are equal, round, and reactive to light.  Cardiovascular:     Rate and Rhythm: Normal rate.  Pulmonary:     Effort: Pulmonary effort is normal.  Skin:    General: Skin is warm and dry.     Findings: Rash (dry scaly lesion that is pruritic, no drainage of pus or bleeding) present.  Neurological:     General: No focal deficit present.     Mental Status: She is alert and oriented to person, place, and time.  Psychiatric:        Mood and Affect: Mood normal.        Behavior: Behavior normal.       Assessment and Plan :   PDMP not reviewed this encounter.  1. Irritant dermatitis    Will cover for an irritant dermatitis with triamcinolone.  Do not suspect fungal infection at this point.  However, emphasized that if the steroid cream makes it worse that we will have to cover with an antifungal cream. Monitor for new exposures at home. Counseled patient on potential for adverse effects with medications prescribed/recommended today, ER and return-to-clinic precautions discussed, patient verbalized understanding.      Wallis Bamberg, PA-C 02/12/21 1408

## 2021-02-12 NOTE — ED Triage Notes (Signed)
Pt presents with c/o a spot on the L arm.  States it has been there x 1 week. States it is itchy.

## 2021-04-25 ENCOUNTER — Encounter: Payer: Self-pay | Admitting: Physician Assistant

## 2021-05-13 ENCOUNTER — Encounter: Payer: Self-pay | Admitting: Physician Assistant

## 2021-05-13 ENCOUNTER — Ambulatory Visit: Payer: 59 | Admitting: Physician Assistant

## 2021-05-13 ENCOUNTER — Other Ambulatory Visit (INDEPENDENT_AMBULATORY_CARE_PROVIDER_SITE_OTHER): Payer: 59

## 2021-05-13 VITALS — BP 98/60 | HR 64 | Ht 59.0 in | Wt 100.0 lb

## 2021-05-13 DIAGNOSIS — R110 Nausea: Secondary | ICD-10-CM | POA: Diagnosis not present

## 2021-05-13 DIAGNOSIS — R195 Other fecal abnormalities: Secondary | ICD-10-CM

## 2021-05-13 DIAGNOSIS — R109 Unspecified abdominal pain: Secondary | ICD-10-CM

## 2021-05-13 DIAGNOSIS — R1030 Lower abdominal pain, unspecified: Secondary | ICD-10-CM

## 2021-05-13 LAB — CBC WITH DIFFERENTIAL/PLATELET
Basophils Absolute: 0 10*3/uL (ref 0.0–0.1)
Basophils Relative: 1 % (ref 0.0–3.0)
Eosinophils Absolute: 0.1 10*3/uL (ref 0.0–0.7)
Eosinophils Relative: 1.7 % (ref 0.0–5.0)
HCT: 40.1 % (ref 36.0–46.0)
Hemoglobin: 12.7 g/dL (ref 12.0–15.0)
Lymphocytes Relative: 38.7 % (ref 12.0–46.0)
Lymphs Abs: 1.7 10*3/uL (ref 0.7–4.0)
MCHC: 31.7 g/dL (ref 30.0–36.0)
MCV: 85.9 fl (ref 78.0–100.0)
Monocytes Absolute: 0.3 10*3/uL (ref 0.1–1.0)
Monocytes Relative: 8 % (ref 3.0–12.0)
Neutro Abs: 2.2 10*3/uL (ref 1.4–7.7)
Neutrophils Relative %: 50.6 % (ref 43.0–77.0)
Platelets: 293 10*3/uL (ref 150.0–400.0)
RBC: 4.67 Mil/uL (ref 3.87–5.11)
RDW: 13.6 % (ref 11.5–15.5)
WBC: 4.3 10*3/uL (ref 4.0–10.5)

## 2021-05-13 LAB — SEDIMENTATION RATE: Sed Rate: 22 mm/hr — ABNORMAL HIGH (ref 0–20)

## 2021-05-13 LAB — C-REACTIVE PROTEIN: CRP: <1 mg/dL (ref 0.5–20.0)

## 2021-05-13 MED ORDER — GLYCOPYRROLATE 2 MG PO TABS: 2.0000 mg | ORAL_TABLET | Freq: Two times a day (BID) | ORAL | 3 refills | Status: DC

## 2021-05-13 NOTE — Progress Notes (Signed)
Agree with assessment and plan as outlined.  

## 2021-05-13 NOTE — Patient Instructions (Addendum)
If you are age 30 or younger, your body mass index should be between 19-25. Your Body mass index is 20.2 kg/m. If this is out of the aformentioned range listed, please consider follow up with your Primary Care Provider.  ________________________________________________________  The [facility] providers would like to encourage you to use [facility] to communicate with providers for non-urgent requests or questions.  Due to long hold times on the telephone, sending your provider a message by [facility] may be a faster and more efficient way to get a response.  Please allow 48 business hours for a response.  Please remember that this is for non-urgent requests.  _______________________________________________________  Your provider has requested that you go to the basement level for lab work before leaving today. Press "B" on the elevator. The lab is located at the first door on the left as you exit the elevator.  You have been scheduled for a CT scan of the abdomen and pelvis at [facility] ([address]---this is in the same building as [employer]).   You are scheduled on ________ at _______. You should arrive 15 minutes prior to your appointment time for registration. Please follow the written instructions below on the day of your exam:  WARNING: IF YOU ARE ALLERGIC TO IODINE/X-RAY DYE, PLEASE NOTIFY RADIOLOGY IMMEDIATELY AT [phone]! YOU WILL BE GIVEN A 13 HOUR PREMEDICATION PREP.  1) Do not eat or drink anything after _______ (4 hours prior to your test) 2) You have been given 2 bottles of oral contrast to drink. The solution may taste better if refrigerated, but do NOT add ice or any other liquid to this solution. Shake well before drinking.    Drink 1 bottle of contrast @ _______ (2 hours prior to your exam)  Drink 1 bottle of contrast @ _______ (1 hour prior to your exam)  You may take any medications as prescribed with a small amount of water, if necessary. If you  take any of the following medications: METFORMIN, GLUCOPHAGE, GLUCOVANCE, AVANDAMET, RIOMET, FORTAMET, [place] MET, JANUMET, GLUMETZA or METAGLIP, you MAY be asked to HOLD this medication 48 hours AFTER the exam.  The purpose of you drinking the oral contrast is to aid in the visualization of your intestinal tract. The contrast solution may cause some diarrhea. Depending on your individual set of symptoms, you may also receive an intravenous injection of x-ray contrast/dye. Plan on being at [facility] for 30 minutes or longer, depending on the type of exam you are having performed.  This test typically takes 30-45 minutes to complete.  If you have any questions regarding your exam or if you need to reschedule, you may call the CT department at [phone] between the hours of 8:00 am and 5:00 pm, Monday-Friday.  ____________________________________________________________  START Glycopyrrolate 2 mg 1 tablet twice daily STOP Pantoprazole  Follow a Lactose free diet.  Get Lactaid, over the counter, take 1 tablets before a meal if you plan to have any dairy.  Follow up pending the results of CT and Labs.  Thank you for entrusting me with your care and choosing [facility].  Amy Esterwood, PA-C

## 2021-05-13 NOTE — Progress Notes (Signed)
Subjective:    Patient ID: Natalie Leblanc, female    DOB: [birth year], 31 y.o.   MRN: [id number]  HPI Brandi is a 31 year old African-American female, new to GI today referred by Lelon Frohlich, MD/primary care for evaluation of abdominal pain. Patient says she has been on Protonix 40 mg once daily over the past year with minimal change in her symptoms.  She does not feel that her abdominal pain is related to acid reflux.  She has no complaints of heartburn or indigestion or dysphagia. She says that she has had symptoms longer than a year in total but have worsened over the past several months.  She complains of discomfort in the lower mid abdomen primarily described as crampy type pain, she has had some associated nausea, no vomiting.  Generally symptoms are worse after eating and will frequently have fairly urgent bowel movements after meals which have been loose but not diarrheal.  She does generally feel somewhat better after a bowel movement.  Appetite has been okay, weight has been stable. She does feel that she has some component of lactose intolerance because that usually will aggravate her symptoms.  She has been trying to use Lactaid milk but feels that she still gets some symptoms with this.  She is also noticed that every time she eats mango she will develop nausea and is unable to tolerate fish generally. Family history is negative for GI disease as far she is aware. She has not had any previous abdominal imaging.  Other medical problems include PTSD and depression.  Review of Systems Pertinent positive and negative review of systems were noted in the above HPI section.  All other review of systems was otherwise negative.   Outpatient Encounter Medications as of 05/13/2021  Medication Sig   glycopyrrolate (ROBINUL) 2 MG tablet Take 1 tablet (2 mg total) by mouth 2 (two) times daily.   Multiple Vitamin (MULTIVITAMIN) capsule Take 1 capsule by mouth daily.   sertraline  (ZOLOFT) 100 MG tablet Take 100 mg by mouth daily.   triamcinolone cream (KENALOG) 0.1 % Apply 1 application topically 2 (two) times daily.   [DISCONTINUED] pantoprazole (PROTONIX) 40 MG tablet Take 1 tablet (40 mg total) by mouth daily.   No facility-administered encounter medications on file as of 05/13/2021.   Allergies  Allergen Reactions   Fish Allergy     GI upset   Other Itching    Animal Dander   Patient Active Problem List   Diagnosis Date Noted   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 12/28/2019   Vitamin D deficiency 12/23/2019   Depression    PTSD (post-traumatic stress disorder)    Social History   Socioeconomic History   Marital status: Single    Spouse name: Not on file   Number of children: Not on file   Years of education: Not on file   Highest education level: Not on file  Occupational History   Not on file  Tobacco Use   Smoking status: Never   Smokeless tobacco: Never  Vaping Use   Vaping Use: Never used  Substance and Sexual Activity   Alcohol use: Yes    Comment: occasional   Drug use: No   Sexual activity: Yes  Other Topics Concern   Not on file  Social History Narrative   Not on file   Social Determinants of Health   Financial Resource Strain: Not on file  Food Insecurity: Not on file  Transportation Needs: Not on  file  Physical Activity: Not on file  Stress: Not on file  Social Connections: Not on file  Intimate Partner Violence: Not on file    Ms. Runco's family history includes Alcohol abuse in her mother; Depression in her sister and sister; Eczema in her sister; Gestational diabetes in her mother; Heart murmur in her sister; Mental illness in her mother; Psychosis in her maternal aunt; Thyroid disease in her sister.      Objective:    Vitals:   05/13/21 1146  BP: 98/60  Pulse: 64    Physical Exam Well-developed well-nourished  thin AA female  in no acute distress.  Height, Weight, 100 BMI 20.2  HEENT;  nontraumatic normocephalic, EOMI, PE R LA, sclera anicteric. Oropharynx;not examined today Neck; supple, no JVD Cardiovascular; regular rate and rhythm with S1-S2, no murmur rub or gallop Pulmonary; Clear bilaterally Abdomen; soft, mild tenderness low mid abdomen, nondistended, no palpable mass or hepatosplenomegaly, bowel sounds are active Rectal; not done today Skin; benign exam, no jaundice rash or appreciable lesions Extremities; no clubbing cyanosis or edema skin warm and dry Neuro/Psych; alert and oriented x4, grossly nonfocal mood and affect appropriate        Assessment & Plan:   #52 31 year old female with greater than 1 year history of abdominal pain, primarily located in the mid lower abdomen worse postprandially and often associated with urgent bowel movements after eating, intermittent loose stool. No significant response with Protonix over the past year, patient denies any ongoing heartburn or indigestion  Symptoms are definitely exacerbated by lactose suggesting lactose intolerance.  I am not certain that lactose intolerance explains all of her symptoms, consider underlying IBS, rule out IBD  #2 history of PTSD #3.  Depression #4 Food intolerance/fish/mango  Plan; CBC with differential, BMET, sed rate, CRP, TTG IgA We discussed lactose free diet, she may also want to try Lactaid tablet 2 with meals for any known lactose consumption Okay to stop Protonix Start glycopyrrolate 2 mg p.o. twice daily.  Can start with 1 dose every morning, then use second dose as needed Patient will be scheduled for CT of the abdomen pelvis with contrast. Further recommendations pending results of above. Patient will be established with Dr. Baltazar Apo PA-C 05/13/2021   Cc: Isaac Bliss, [place]*

## 2021-05-14 LAB — TISSUE TRANSGLUTAMINASE, IGA: (tTG) Ab, IgA: <1 U/mL

## 2021-05-14 LAB — IGA: Immunoglobulin A: 87 mg/dL (ref 47–310)

## 2021-05-17 ENCOUNTER — Other Ambulatory Visit: Payer: Self-pay | Admitting: Internal Medicine

## 2021-05-17 DIAGNOSIS — K219 Gastro-esophageal reflux disease without esophagitis: Secondary | ICD-10-CM

## 2021-05-23 ENCOUNTER — Ambulatory Visit (INDEPENDENT_AMBULATORY_CARE_PROVIDER_SITE_OTHER)
Admission: RE | Admit: 2021-05-23 | Discharge: 2021-05-23 | Disposition: A | Discharge status: Home / Self Care | Payer: 59 | Source: Ambulatory Visit | Attending: Physician Assistant | Admitting: Physician Assistant

## 2021-05-23 ENCOUNTER — Other Ambulatory Visit: Payer: Self-pay

## 2021-05-23 ENCOUNTER — Other Ambulatory Visit: Payer: 59

## 2021-05-23 DIAGNOSIS — R195 Other fecal abnormalities: Secondary | ICD-10-CM | POA: Diagnosis not present

## 2021-05-23 DIAGNOSIS — R1030 Lower abdominal pain, unspecified: Secondary | ICD-10-CM

## 2021-05-23 DIAGNOSIS — R109 Unspecified abdominal pain: Secondary | ICD-10-CM | POA: Diagnosis not present

## 2021-05-23 DIAGNOSIS — R110 Nausea: Secondary | ICD-10-CM | POA: Diagnosis not present

## 2021-05-23 MED ORDER — IOHEXOL 300 MG/ML  SOLN
80.0000 mL | Freq: Once | INTRAMUSCULAR | Status: AC | PRN
  Administered 2021-05-23: 80 mL via INTRAVENOUS

## 2021-05-24 LAB — FECAL LACTOFERRIN, QUANT
Fecal Lactoferrin: NEGATIVE
MICRO NUMBER:: 13018551
SPECIMEN QUALITY:: ADEQUATE

## 2021-07-08 ENCOUNTER — Other Ambulatory Visit: Payer: Self-pay | Admitting: Internal Medicine

## 2021-07-08 ENCOUNTER — Telehealth: Payer: 59 | Admitting: Internal Medicine

## 2021-07-08 DIAGNOSIS — Z124 Encounter for screening for malignant neoplasm of cervix: Secondary | ICD-10-CM

## 2021-07-10 ENCOUNTER — Encounter (HOSPITAL_COMMUNITY): Payer: Self-pay | Admitting: Emergency Medicine

## 2021-07-10 ENCOUNTER — Ambulatory Visit (HOSPITAL_COMMUNITY)
Admission: EM | Admit: 2021-07-10 | Discharge: 2021-07-10 | Disposition: A | Discharge status: Home / Self Care | Payer: 59 | Attending: Emergency Medicine | Admitting: Emergency Medicine

## 2021-07-10 DIAGNOSIS — K625 Hemorrhage of anus and rectum: Secondary | ICD-10-CM | POA: Diagnosis present

## 2021-07-10 MED ORDER — ONDANSETRON HCL 4 MG PO TABS: 4.0000 mg | ORAL_TABLET | Freq: Three times a day (TID) | ORAL | 0 refills | Status: DC | PRN

## 2021-07-10 NOTE — ED Triage Notes (Signed)
Pt reports since Sunday had food poisoning with lower abd pains n/v/d. Reports for 3 days had bright red blood in stools.  ?

## 2021-07-10 NOTE — ED Provider Notes (Signed)
?[facility] ? ? ?MRN: [id number] DOB: 11-Sep-1990 ? ?Subjective:  ? ?Chief Complaint;  ?Chief Complaint  ?Patient presents with  ? Abdominal Pain  ? Diarrhea  ? Rectal Bleeding  ? ? ?Natalie Leblanc is a 31 y.o. female presenting for rectal bleeding that started on Sunday.  Patient had [place] food on Sunday followed shortly thereafter by vomiting and bloody diarrhea.  Patient persisted with multiple bloody diarrhea stools until yesterday when she had none.  The bloody stool restarted today and she has had 3 episodes.  Patient is uncertain whether she has been running fevers but has had abdominal bloating.  She has no history of gastric ulcers or ulcerative colitis she was started on new medication Robinul back in February for GI cramping started by her PCP ? ?No current facility-administered medications for this encounter. ? ?Current Outpatient Medications:  ?  ondansetron (ZOFRAN) 4 MG tablet, Take 1 tablet (4 mg total) by mouth every 8 (eight) hours as needed for nausea or vomiting., Disp: 4 tablet, Rfl: 0 ?  glycopyrrolate (ROBINUL) 2 MG tablet, Take 1 tablet (2 mg total) by mouth 2 (two) times daily., Disp: 60 tablet, Rfl: 3 ?  Multiple Vitamin (MULTIVITAMIN) capsule, Take 1 capsule by mouth daily., Disp: , Rfl:  ?  sertraline (ZOLOFT) 100 MG tablet, Take 100 mg by mouth daily., Disp: , Rfl:  ?  triamcinolone cream (KENALOG) 0.1 %, Apply 1 application topically 2 (two) times daily., Disp: 30 g, Rfl: 0  ? ?Allergies  ?Allergen Reactions  ? Fish Allergy   ?  GI upset  ? Other Itching  ?  Animal Dander  ? ? ?Past Medical History:  ?Diagnosis Date  ? Depression   ? PTSD (post-traumatic stress disorder)   ?  ? ?Review of Systems  ?All other systems reviewed and are negative. ? ? ?Objective:  ? ?Vitals: ?BP 128/72 (BP Location: Right Arm)   Pulse 73   Temp 98.9 ?F (37.2 ?C) (Oral)   Resp 15   LMP 06/30/2021   SpO2 99%  ? ?Physical Exam ?Vitals and nursing note reviewed.   ?Constitutional:   ?   General: She is not in acute distress. ?   Appearance: She is well-developed.  ?HENT:  ?   Head: Normocephalic and atraumatic.  ?Eyes:  ?   Conjunctiva/sclera: Conjunctivae normal.  ?Cardiovascular:  ?   Rate and Rhythm: Normal rate and regular rhythm.  ?   Heart sounds: No murmur heard. ?Pulmonary:  ?   Effort: Pulmonary effort is normal. No respiratory distress.  ?   Breath sounds: Normal breath sounds.  ?Abdominal:  ?   General: Bowel sounds are decreased.  ?   Palpations: Abdomen is soft.  ?   Tenderness: There is generalized abdominal tenderness (Generalized direct lower abdominal tenderness without rebound tenderness.  Slightly hypoactive bowel sounds positive bloating, no distention). There is no right CVA tenderness, left CVA tenderness or guarding. Negative signs include Rovsing's sign and psoas sign.  ?   Comments: Rectal exam positive for frank blood.  Positive external nonthrombosed hemorrhoid present at 3:00.  Anal sphincter normal tone, no palpable masses or palpable internal hemorrhoid.  ?Musculoskeletal:     ?   General: No swelling.  ?   Cervical back: Neck supple.  ?Skin: ?   General: Skin is warm and dry.  ?   Capillary Refill: Capillary refill takes less than 2 seconds.  ?Neurological:  ?   Mental Status: She is  alert.  ?Psychiatric:     ?   Mood and Affect: Mood normal.  ? ? ?No results found for this or any previous visit (from the past 24 hour(s)). ? ?No results found.  ?  ? ?Assessment and Plan :  ? ?1. Rectal bleeding   ? ? ?Meds ordered this encounter  ?Medications  ? ondansetron (ZOFRAN) 4 MG tablet  ?  Sig: Take 1 tablet (4 mg total) by mouth every 8 (eight) hours as needed for nausea or vomiting.  ?  Dispense:  4 tablet  ?  Refill:  0  ? ? ?MDM:  ?[name] is a 31 y.o. female presenting for frank rectal bleeding and symptoms of nausea and abdominal cramping possible food poisoning.  On exam patient was hemodynamically stable no tachycardia or  hypotension was noted patient appeared mild to moderate distress with mild bloating and tenderness in the lower abdomen and frank rectal bleeding on rectal exam and stool specimen.  PCR stool testing is pending.  Patient encouraged to follow-up in ER if new or worsening symptoms for further diagnostics.  If cultures returned negative for infectious source patient should follow-up with GI specialist.  I discussed treatment, follow up and return instructions. Questions were answered. Patient stated understanding of instructions and is stable for discharge. ? ?Natalie Conger FNP-C MCN  ?  ?Natalie Baseman, NP ?07/10/21 1633 ? ?

## 2021-07-10 NOTE — Discharge Instructions (Addendum)
Stool culture was sent we will call with the results when received.  Go to the emergency room if worse, if lightheaded dizzy or fast heartbeat for further blood testing.  If there is no infectious process you should follow-up with gastrointestinal specialist for further work-up.  Push fluids and take Zofran as needed for nausea ?

## 2021-07-11 LAB — GASTROINTESTINAL PANEL BY PCR, STOOL (REPLACES STOOL CULTURE)

## 2021-07-19 ENCOUNTER — Encounter: Payer: Self-pay | Admitting: Internal Medicine

## 2021-07-25 ENCOUNTER — Ambulatory Visit: Payer: 59 | Admitting: Gastroenterology

## 2021-07-25 ENCOUNTER — Encounter: Payer: Self-pay | Admitting: Gastroenterology

## 2021-07-25 VITALS — BP 100/56 | HR 62 | Ht 59.0 in | Wt 97.4 lb

## 2021-07-25 DIAGNOSIS — K625 Hemorrhage of anus and rectum: Secondary | ICD-10-CM | POA: Diagnosis not present

## 2021-07-25 DIAGNOSIS — R194 Change in bowel habit: Secondary | ICD-10-CM | POA: Diagnosis not present

## 2021-07-25 DIAGNOSIS — R1030 Lower abdominal pain, unspecified: Secondary | ICD-10-CM

## 2021-07-25 MED ORDER — DICYCLOMINE HCL 10 MG PO CAPS: 10.0000 mg | ORAL_CAPSULE | Freq: Three times a day (TID) | ORAL | 3 refills | Status: DC | PRN

## 2021-07-25 MED ORDER — POLYETHYLENE GLYCOL 3350 17 G PO PACK: 17.0000 g | PACK | Freq: Every day | ORAL | 0 refills | Status: DC

## 2021-07-25 NOTE — Progress Notes (Signed)
? ?HPI :  ?31 year old female here for follow-up visit for abdominal pain, altered bowel habits, history of rectal bleeding.  Recall she was seen by Nicoletta Ba on February 6 for her intake visit. ? ?Recall for the past year she has had symptoms of lower abdominal discomfort.  She can have an urgent bowel movement typically after she drinks milk or dairy, or specifically mango or fish which generally make her feel bad.  At baseline she actually states she has a bowel movement about once every 3 days or so at baseline.  She states her mother gave her a probiotic to take which she has tried on a few occasions to help her have more regular bowel movements.  She states she has intermittent lower abdominal discomfort that can be worse with the urge to have a bowel movement and is relieved with a bowel movement for the most part.  She historically has not had any blood in her stools.  No history of colon cancer her IBD in the family. ?At her last visit she had a CT scan that was ordered which showed some stool retention in her colon but otherwise no acute pathology.  She was given a trial of Robinul which she states she has been taking but does not think it helps too much.  She has been using Lactaid and trying to go on a lactose free diet which she does thinks helps somewhat. ? ?A few weeks ago she states she ate at a restaurant with a friend, the next morning she developed nausea vomiting with diarrhea and abdominal cramps.  She states the nausea vomiting last about a day, the diarrhea lasted for a few days and she had some bright red blood in her stool for a few days with this.  She was seen in urgent care and had a rectal exam she states showed hemorrhoids and nothing concerning.  She states the bleeding resolved on its own as well as the diarrhea and she now feels back to her normal baseline.  The first time she is ever noticed anything like this. ? ?She had a GI pathogen panel at urgent care which was negative.   After her visit with our office in February she had a negative celiac panel, normal CRP, mild elevation in ESR to the 20s, CBC was normal with no anemia.  Lactoferrin was negative. ? ?Prior work-up: ?Celiac testing negative ? ?CRP negative ?ESR 22 ?CBC normal ?Lactoferrin negative ?GI pathogen panel negative 07/10/21 ? ? ?CT scan abdomen pelvis 05/23/21: ?IMPRESSION: ?1. No acute abdominopelvic findings. ?2. Large volume of formed stool in the colon suggestive of ?constipation. ? ? ?Past Medical History:  ?Diagnosis Date  ? Depression   ? PTSD (post-traumatic stress disorder)   ? ? ? ?Past Surgical History:  ?Procedure Laterality Date  ? BREAST LUMPECTOMY Left   ? benign per patient  ? ?Family History  ?Problem Relation Age of Onset  ? Gestational diabetes Mother   ? Alcohol abuse Mother   ? Mental illness Mother   ? Eczema Sister   ? Depression Sister   ? Psychosis Maternal Aunt   ? Thyroid disease Sister   ? Depression Sister   ? Heart murmur Sister   ? ?Social History  ? ?Tobacco Use  ? Smoking status: Never  ? Smokeless tobacco: Never  ?Vaping Use  ? Vaping Use: Never used  ?Substance Use Topics  ? Alcohol use: Yes  ?  Comment: occasional  ? Drug use: No  ? ?  Current Outpatient Medications  ?Medication Sig Dispense Refill  ? glycopyrrolate (ROBINUL) 2 MG tablet Take 1 tablet (2 mg total) by mouth 2 (two) times daily. 60 tablet 3  ? Multiple Vitamin (MULTIVITAMIN) capsule Take 1 capsule by mouth daily.    ? ondansetron (ZOFRAN) 4 MG tablet Take 1 tablet (4 mg total) by mouth every 8 (eight) hours as needed for nausea or vomiting. 4 tablet 0  ? sertraline (ZOLOFT) 100 MG tablet Take 100 mg by mouth daily.    ? triamcinolone cream (KENALOG) 0.1 % Apply 1 application topically 2 (two) times daily. 30 g 0  ? ?No current facility-administered medications for this visit.  ? ?Allergies  ?Allergen Reactions  ? Fish Allergy   ?  GI upset  ? Other Itching  ?  Animal Dander  ? ? ? ?Review of Systems: ?All systems reviewed and  negative except where noted in HPI.  ? ? ?Lab Results  ?Component Value Date  ? WBC 4.3 05/13/2021  ? HGB 12.7 05/13/2021  ? HCT 40.1 05/13/2021  ? MCV 85.9 05/13/2021  ? PLT 293.0 05/13/2021  ? ? ?Lab Results  ?Component Value Date  ? CREATININE 0.86 05/06/2020  ? BUN 16 05/06/2020  ? NA 137 05/06/2020  ? K 3.7 05/06/2020  ? CL 103 05/06/2020  ? CO2 25 05/06/2020  ? ? ?No results found for: ALT, AST, GGT, ALKPHOS, BILITOT ? ? ?Physical Exam: ?BP (!) 100/56   Pulse 62   Ht _0  (1.499 m)   Wt 97 lb 6.4 oz (44.2 kg)   LMP 06/30/2021   BMI 19.67 kg/m?  ?Constitutional: Pleasant,well-developed, female in no acute distress. ?Abdominal: Soft, nondistended, nontender.  There are no masses palpable.  ?Neurological: Alert and oriented to person place and time. ?Psychiatric: Normal mood and affect. Behavior is normal. ? ? ?ASSESSMENT AND PLAN: ?31 year old female here for reassessment following: ? ?Altered bowel habits ?Rectal bleeding ?Abdominal pain ? ?History as outlined above.  Regarding her acute bleeding symptoms sounds like she had an acute gastroenteritis in recent weeks and had some bleeding associated with this which resolved on its own.  Suspect her bleeding was due to hemorrhoids in the setting of diarrhea versus self-limited colitis.  She declined a DRE today given she just had one in urgent care.  She has no baseline anemia and stool studies otherwise look okay.  Her bleeding has resolved.  Given she is feeling better and back to baseline I do not feel strongly that she needs a colonoscopy for this occurs of self limited bleeding, however counseled her that it her bleeding recurs and persists that she will need a colonoscopy.  She will keep a close eye on this and let us know if any recurrence. ? ?Otherwise at baseline she has about 2 bowel movements per week and finds relief of her discomfort with the bowel movement, suspect she may have IBS.  Recommend bowel regimen to move her bowels daily, will start  MiraLAX daily and titrate up as needed.  I will also start Bentyl 10 mg every 8 hours as needed for cramps.  She can hold the Robinul since it has not helped much.  She will continue Lactaid milk as she tolerates that better.  I will see her back in 3 to 4 months for reassessment in the office. ? ?Jolly Mango, MD ?[facility] ? ? ?

## 2021-07-25 NOTE — Patient Instructions (Addendum)
If you are age 30 or older, your body mass index should be between 23-30. Your Body mass index is 19.67 kg/m?Marland Kitchen If this is out of the aforementioned range listed, please consider follow up with your Primary Care Provider. ? ?If you are age 72 or younger, your body mass index should be between 19-25. Your Body mass index is 19.67 kg/m?Marland Kitchen If this is out of the aformentioned range listed, please consider follow up with your Primary Care Provider.  ? ?________________________________________________________ ? ?The [facility] providers would like to encourage you to use [facility] to communicate with providers for non-urgent requests or questions.  Due to long hold times on the telephone, sending your provider a message by [facility] may be a faster and more efficient way to get a response.  Please allow 48 business hours for a response.  Please remember that this is for non-urgent requests.  ?_______________________________________________________  ? ?Please contact us if your rectal bleeding reoccurs. ? ?Please purchase the following medications over the counter and take as directed: ?Miralax: Take once a day and titrate as needed ? ?We have sent the following medications to your pharmacy for you to pick up at your convenience: ?Bentyl 10 mg: Take every 8 hours as needed ? ?Stop Rubinol. ? ?Please follow up in 3 to 4 months. ? ?Thank you for entrusting me with your care and for choosing [facility], ?Dr. Ileene Patrick ?' ? ? ?

## 2021-08-01 ENCOUNTER — Encounter: Payer: Self-pay | Admitting: Internal Medicine

## 2021-08-01 ENCOUNTER — Ambulatory Visit: Payer: 59 | Admitting: Internal Medicine

## 2021-08-01 VITALS — BP 102/68 | HR 88 | Temp 97.9°F | Wt 100.7 lb

## 2021-08-01 DIAGNOSIS — G43809 Other migraine, not intractable, without status migrainosus: Secondary | ICD-10-CM

## 2021-08-01 NOTE — Progress Notes (Signed)
? ? ? ?Established Patient Office Visit ? ? ? ? ?This visit occurred during the SARS-CoV-2 public health emergency.  Safety protocols were in place, including screening questions prior to the visit, additional usage of staff PPE, and extensive cleaning of exam room while observing appropriate contact time as indicated for disinfecting solutions.  ? ? ?CC/Reason for Visit: Work accommodation ? ?HPI: Natalie Leblanc is a 31 y.o. female who is coming in today for the above mentioned reasons.  In March 2022 she had an ED visit at [facility] due to a severe migraine and temporary loss of consciousness due to being exposed to the chemical acetone while at work.  She is again be required to work with the substance and is requesting a letter to support this accommodation. ? ?Past Medical/Surgical History: ?Past Medical History:  ?Diagnosis Date  ? Depression   ? PTSD (post-traumatic stress disorder)   ? ? ?Past Surgical History:  ?Procedure Laterality Date  ? BREAST LUMPECTOMY Left   ? benign per patient  ? ? ?Social History: ? reports that she has never smoked. She has never used smokeless tobacco. She reports current alcohol use. She reports that she does not use drugs. ? ?Allergies: ?Allergies  ?Allergen Reactions  ? Fish Allergy   ?  GI upset  ? Other Itching  ?  Animal Dander  ? ? ?Family History:  ?Family History  ?Problem Relation Age of Onset  ? Gestational diabetes Mother   ? Alcohol abuse Mother   ? Mental illness Mother   ? Eczema Sister   ? Depression Sister   ? Psychosis Maternal Aunt   ? Thyroid disease Sister   ? Depression Sister   ? Heart murmur Sister   ? ? ? ?Current Outpatient Medications:  ?  hydrOXYzine (ATARAX) 25 MG tablet, Take 25 mg by mouth at bedtime as needed., Disp: , Rfl:  ?  Multiple Vitamin (MULTIVITAMIN) capsule, Take 1 capsule by mouth daily., Disp: , Rfl:  ?  ondansetron (ZOFRAN) 4 MG tablet, Take 1 tablet (4 mg total) by mouth every 8 (eight) hours as needed for nausea or vomiting.,  Disp: 4 tablet, Rfl: 0 ?  polyethylene glycol (MIRALAX) 17 g packet, Take 17 g by mouth daily. Titrate as needed, Disp: 14 each, Rfl: 0 ?  sertraline (ZOLOFT) 100 MG tablet, Take 100 mg by mouth daily., Disp: , Rfl:  ?  triamcinolone cream (KENALOG) 0.1 %, Apply 1 application topically 2 (two) times daily., Disp: 30 g, Rfl: 0 ? ?Review of Systems:  ?Constitutional: Denies fever, chills, diaphoresis, appetite change and fatigue.  ?HEENT: Denies photophobia, eye pain, redness, hearing loss, ear pain, congestion, sore throat, rhinorrhea, sneezing, mouth sores, trouble swallowing, neck pain, neck stiffness and tinnitus.   ?Respiratory: Denies SOB, DOE, cough, chest tightness,  and wheezing.   ?Cardiovascular: Denies chest pain, palpitations and leg swelling.  ?Gastrointestinal: Denies nausea, vomiting, abdominal pain, diarrhea, constipation, blood in stool and abdominal distention.  ?Genitourinary: Denies dysuria, urgency, frequency, hematuria, flank pain and difficulty urinating.  ?Endocrine: Denies: hot or cold intolerance, sweats, changes in hair or nails, polyuria, polydipsia. ?Musculoskeletal: Denies myalgias, back pain, joint swelling, arthralgias and gait problem.  ?Skin: Denies pallor, rash and wound.  ?Neurological: Denies dizziness, seizures, syncope, weakness, light-headedness, numbness and headaches.  ?Hematological: Denies adenopathy. Easy bruising, personal or family bleeding history  ?Psychiatric/Behavioral: Denies suicidal ideation, mood changes, confusion, nervousness, sleep disturbance and agitation ? ? ? ?Physical Exam: ?Vitals:  ? 08/01/21 1040  ?BP:  102/68  ?Pulse: 88  ?Temp: 97.9 ?F (36.6 ?C)  ?TempSrc: Oral  ?SpO2: 100%  ?Weight: 100 lb 11.2 oz (45.7 kg)  ? ? ?Body mass index is 20.34 kg/m?. ? ? ?Constitutional: NAD, calm, comfortable ?Eyes: PERRL, lids and conjunctivae normal ?ENMT: Mucous membranes are moist. .  ? ? ?Impression and Plan: ? ?Other migraine without status migrainosus, not  intractable ? ?-I agree that she should not be in contact with the substance, letter has been provided. ? ?Time spent:20 minutes reviewing chart, interviewing and examining patient and formulating plan of care. ? ? ? ? ? ?Chaya Jan, MD ?[facility] ? ? ?

## 2021-08-23 ENCOUNTER — Other Ambulatory Visit (HOSPITAL_COMMUNITY)
Admission: RE | Admit: 2021-08-23 | Discharge: 2021-08-23 | Disposition: A | Discharge status: Home / Self Care | Payer: 59 | Source: Ambulatory Visit | Attending: Obstetrics & Gynecology | Admitting: Obstetrics & Gynecology

## 2021-08-23 ENCOUNTER — Ambulatory Visit (INDEPENDENT_AMBULATORY_CARE_PROVIDER_SITE_OTHER): Payer: 59 | Admitting: Obstetrics & Gynecology

## 2021-08-23 ENCOUNTER — Encounter (HOSPITAL_BASED_OUTPATIENT_CLINIC_OR_DEPARTMENT_OTHER): Payer: Self-pay | Admitting: Obstetrics & Gynecology

## 2021-08-23 VITALS — BP 123/71 | HR 83 | Ht 59.0 in | Wt 98.0 lb

## 2021-08-23 DIAGNOSIS — Z124 Encounter for screening for malignant neoplasm of cervix: Secondary | ICD-10-CM | POA: Insufficient documentation

## 2021-08-23 DIAGNOSIS — N9489 Other specified conditions associated with female genital organs and menstrual cycle: Secondary | ICD-10-CM

## 2021-08-23 DIAGNOSIS — Z113 Encounter for screening for infections with a predominantly sexual mode of transmission: Secondary | ICD-10-CM

## 2021-08-23 NOTE — Progress Notes (Signed)
GYNECOLOGY  VISIT  CC:   swollen labia  HPI: 31 y.o. G0 Single Black or [place] American female here for complaint of swollen labia.  This has improved since appt was made but felt she should come for appt.  Cycles are regular.  Denies vaginal odor.  Has had some itching and mild discharge.  Did have ASCUS pap with neg HR HPV 12/2020.  Reviewed in chart.  Pt has questions about pt.  Reassured this is a normal pap and follow up 3 years recommended.   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     Multiple Vitamin (MULTIVITAMIN) capsule Take 1 capsule by mouth daily.     polyethylene glycol (MIRALAX) 17 g packet Take 17 g by mouth daily. Titrate as needed 14 each 0   sertraline (ZOLOFT) 100 MG tablet Take 100 mg by mouth daily.     triamcinolone cream (KENALOG) 0.1 % Apply 1 application topically 2 (two) times daily. 30 g 0   No current facility-administered medications on file prior to visit.    ALLERGIES: Fish allergy and Other  Family History  Problem Relation Age of Onset   Gestational diabetes Mother    Alcohol abuse Mother    Mental illness Mother    Eczema Sister    Depression Sister    Psychosis Maternal Aunt    Thyroid disease Sister    Depression Sister    Heart murmur Sister     SH:  single, non smoker  Review of Systems  Genitourinary: Negative.   All other systems reviewed and are negative.  PHYSICAL EXAMINATION:    BP  123/71 (BP Location: Right Arm, Patient Position: Sitting, Cuff Size: Normal)   Pulse 83   Ht 4\' 11"  (1.499 m) Comment: Reported  Wt 98 lb (44.5 kg)   BMI 19.79 kg/m     General appearance: alert, cooperative and appears stated age Lymph:  no inguinal LAD noted  Pelvic: External genitalia:  no lesions              Urethra:  normal appearing urethra with no masses, tenderness or lesions              Bartholins and Skenes: normal                 Vagina: normal appearing vagina with normal color and discharge, no lesions              Cervix: no lesions              Bimanual Exam:  Uterus:  normal size, contour, position, consistency, mobility, non-tender              Adnexa: no mass, fullness, tenderness              Chaperone, Octaviano Batty, CMA, was present for exam.  Assessment/Plan: 1. Labial swelling - improved today  2. Screening examination for STD (sexually transmitted disease) - tested for yeast, BV, Gc/Chl and trich today - Cervicovaginal ancillary only( IXL)

## 2021-08-26 LAB — CERVICOVAGINAL ANCILLARY ONLY
Bacterial Vaginitis (gardnerella): NEGATIVE
Candida Glabrata: NEGATIVE
Candida Vaginitis: POSITIVE — AB
Chlamydia: NEGATIVE
Comment: NEGATIVE
Comment: NEGATIVE
Comment: NEGATIVE
Comment: NEGATIVE
Comment: NEGATIVE
Comment: NORMAL
Neisseria Gonorrhea: NEGATIVE
Trichomonas: NEGATIVE

## 2021-08-27 MED ORDER — TERCONAZOLE 0.8 % VA CREA
1.0000 | TOPICAL_CREAM | Freq: Every day | VAGINAL | 0 refills | Status: DC
Start: 2021-08-27 — End: 2021-10-15

## 2021-08-27 NOTE — Addendum Note (Signed)
Addended by: Blenda Nicely on: 08/27/2021 10:09 AM   Modules accepted: Orders

## 2021-10-10 ENCOUNTER — Telehealth: Payer: 59 | Admitting: Internal Medicine

## 2021-10-15 ENCOUNTER — Encounter: Payer: Self-pay | Admitting: Internal Medicine

## 2021-10-15 ENCOUNTER — Ambulatory Visit: Payer: 59 | Admitting: Internal Medicine

## 2021-10-15 ENCOUNTER — Telehealth: Payer: 59 | Admitting: Internal Medicine

## 2021-10-15 ENCOUNTER — Other Ambulatory Visit: Payer: Self-pay | Admitting: Internal Medicine

## 2021-10-15 VITALS — BP 110/78 | HR 76 | Temp 98.1°F | Wt 97.0 lb

## 2021-10-15 DIAGNOSIS — E559 Vitamin D deficiency, unspecified: Secondary | ICD-10-CM | POA: Diagnosis not present

## 2021-10-15 DIAGNOSIS — F411 Generalized anxiety disorder: Secondary | ICD-10-CM | POA: Diagnosis not present

## 2021-10-15 LAB — COMPREHENSIVE METABOLIC PANEL
ALT: 10 U/L (ref 0–35)
AST: 18 U/L (ref 0–37)
Albumin: 4.6 g/dL (ref 3.5–5.2)
Alkaline Phosphatase: 65 U/L (ref 39–117)
BUN: 14 mg/dL (ref 6–23)
CO2: 29 mEq/L (ref 19–32)
Calcium: 9.7 mg/dL (ref 8.4–10.5)
Chloride: 100 mEq/L (ref 96–112)
Creatinine, Ser: 0.89 mg/dL (ref 0.40–1.20)
GFR: 87.47 mL/min (ref >60.00)
Glucose, Bld: 83 mg/dL (ref 70–99)
Potassium: 3.9 mEq/L (ref 3.5–5.1)
Sodium: 137 mEq/L (ref 135–145)
Total Bilirubin: 0.5 mg/dL (ref 0.2–1.2)
Total Protein: 7.5 g/dL (ref 6.0–8.3)

## 2021-10-15 LAB — CBC WITH DIFFERENTIAL/PLATELET
Basophils Absolute: 0 10*3/uL (ref 0.0–0.1)
Basophils Relative: 0.8 % (ref 0.0–3.0)
Eosinophils Absolute: 0 10*3/uL (ref 0.0–0.7)
Eosinophils Relative: 0.9 % (ref 0.0–5.0)
HCT: 38.3 % (ref 36.0–46.0)
Hemoglobin: 12.3 g/dL (ref 12.0–15.0)
Lymphocytes Relative: 31.4 % (ref 12.0–46.0)
Lymphs Abs: 1.8 10*3/uL (ref 0.7–4.0)
MCHC: 32.1 g/dL (ref 30.0–36.0)
MCV: 85.2 fl (ref 78.0–100.0)
Monocytes Absolute: 0.5 10*3/uL (ref 0.1–1.0)
Monocytes Relative: 9.6 % (ref 3.0–12.0)
Neutro Abs: 3.3 10*3/uL (ref 1.4–7.7)
Neutrophils Relative %: 57.3 % (ref 43.0–77.0)
Platelets: 270 10*3/uL (ref 150.0–400.0)
RBC: 4.5 Mil/uL (ref 3.87–5.11)
RDW: 15.4 % (ref 11.5–15.5)
WBC: 5.7 10*3/uL (ref 4.0–10.5)

## 2021-10-15 LAB — VITAMIN D 25 HYDROXY (VIT D DEFICIENCY, FRACTURES): VITD: 26.96 ng/mL — ABNORMAL LOW (ref 30.00–100.00)

## 2021-10-15 LAB — TSH: TSH: 3.97 u[IU]/mL (ref 0.35–5.50)

## 2021-10-15 LAB — VITAMIN B12: Vitamin B-12: 1174 pg/mL — ABNORMAL HIGH (ref 211–911)

## 2021-10-15 MED ORDER — VITAMIN D (ERGOCALCIFEROL) 1.25 MG (50000 UNIT) PO CAPS: 50000.0000 [IU] | ORAL_CAPSULE | ORAL | 0 refills | Status: AC

## 2021-10-15 NOTE — Progress Notes (Signed)
Established Patient Office Visit     CC/Reason for Visit: "Requesting labs to rule out whole body disease"  HPI: Natalie Leblanc is a 31 y.o. female who is coming in today for the above mentioned reasons. Past Medical History is significant for: Significant anxiety/depression.  She is followed by psychiatry and is on sertraline 100 mg daily that she takes routinely.  She is requesting "complete blood analysis to look for any diseases or defects in my body".  She is having no specific symptoms.  She is not currently seeing a psychiatrist.   Past Medical/Surgical History: Past Medical History:  Diagnosis Date   Depression    PTSD (post-traumatic stress disorder)     Past Surgical History:  Procedure Laterality Date   BREAST LUMPECTOMY Left    benign per patient    Social History:  reports that she has never smoked. She has never used smokeless tobacco. She reports current alcohol use. She reports that she does not use drugs.  Allergies: Allergies  Allergen Reactions   Fish Allergy     GI upset   Other Itching    Animal Dander    Family History:  Family History  Problem Relation Age of Onset   Gestational diabetes Mother    Alcohol abuse Mother    Mental illness Mother    Eczema Sister    Depression Sister    Psychosis Maternal Aunt    Thyroid disease Sister    Depression Sister    Heart murmur Sister      Current Outpatient Medications:    hydrOXYzine (ATARAX) 25 MG tablet, Take 25 mg by mouth at bedtime as needed., Disp: , Rfl:    Multiple Vitamin (MULTIVITAMIN) capsule, Take 1 capsule by mouth daily., Disp: , Rfl:    sertraline (ZOLOFT) 100 MG tablet, Take 100 mg by mouth daily., Disp: , Rfl:    triamcinolone cream (KENALOG) 0.1 %, Apply 1 application topically 2 (two) times daily., Disp: 30 g, Rfl: 0  Review of Systems:  Constitutional: Denies fever, chills, diaphoresis, appetite change and fatigue.  HEENT: Denies photophobia, eye pain,  redness, hearing loss, ear pain, congestion, sore throat, rhinorrhea, sneezing, mouth sores, trouble swallowing, neck pain, neck stiffness and tinnitus.   Respiratory: Denies SOB, DOE, cough, chest tightness,  and wheezing.   Cardiovascular: Denies chest pain, palpitations and leg swelling.  Gastrointestinal: Denies nausea, vomiting, abdominal pain, diarrhea, constipation, blood in stool and abdominal distention.  Genitourinary: Denies dysuria, urgency, frequency, hematuria, flank pain and difficulty urinating.  Endocrine: Denies: hot or cold intolerance, sweats, changes in hair or nails, polyuria, polydipsia. Musculoskeletal: Denies myalgias, back pain, joint swelling, arthralgias and gait problem.  Skin: Denies pallor, rash and wound.  Neurological: Denies dizziness, seizures, syncope, weakness, light-headedness, numbness and headaches.  Hematological: Denies adenopathy. Easy bruising, personal or family bleeding history  Psychiatric/Behavioral: Denies suicidal ideation.   Physical Exam: Vitals:   10/15/21 1119  BP: 110/78  Pulse: 76  Temp: 98.1 F (36.7 C)  TempSrc: Oral  SpO2: 99%  Weight: 97 lb (44 kg)    Body mass index is 19.59 kg/m.   Constitutional: NAD, calm, comfortable Eyes: PERRL, lids and conjunctivae normal, wears corrective lenses ENMT: Mucous membranes are moist.  Respiratory: clear to auscultation bilaterally, no wheezing, no crackles. Normal respiratory effort. No accessory muscle use.  Cardiovascular: Regular rate and rhythm, no murmurs / rubs / gallops. No extremity edema.    Impression and Plan:  GAD (generalized anxiety disorder)  -  Plan: CBC with Differential/Platelet, Comprehensive metabolic panel, TSH, Vitamin B12    10/15/2021   11:22 AM  GAD 7 : Generalized Anxiety Score  Nervous, Anxious, on Edge 1  Control/stop worrying 3  Worry too much - different things 3  Trouble relaxing 3  Restless 2  Easily annoyed or irritable 0  Afraid - awful  might happen 2  Total GAD 7 Score 14  Anxiety Difficulty Somewhat difficult    Flowsheet Row Office Visit from 10/15/2021 in [facility] [place]  PHQ-9 Total Score 5      -She has scored quite highly on GAD-7.  I suspect that her concern about body illness stems from her anxiety. -She has been advised that insurance may not cover this lab work due to lack of necessity, she wishes however to proceed. -I have advised that she secure follow-up with her psychiatrist for medication management of her anxiety, have also given her information on how to set up cognitive behavioral therapy.  Vitamin D deficiency  - Plan: VITAMIN D 25 Hydroxy (Vit-D Deficiency, Fractures)    Time spent:24 minutes reviewing chart, interviewing and examining patient and formulating plan of care.    Chaya Jan, MD [facility]

## 2021-10-16 ENCOUNTER — Other Ambulatory Visit: Payer: Self-pay | Admitting: *Deleted

## 2021-10-16 DIAGNOSIS — E559 Vitamin D deficiency, unspecified: Secondary | ICD-10-CM

## 2021-10-18 ENCOUNTER — Encounter: Payer: Self-pay | Admitting: Internal Medicine

## 2021-10-18 NOTE — Telephone Encounter (Signed)
Pt states she has questions about her high b12. Informed that PCP out of office & will return Weds Jul 19 & will review her concerns at that time. Pt verb understanding.  States she has not picked up Vit D yet, plans to pick it up today or Monday. Declines to set up f/u lab at this time. Will call back to schedule once she starts supplement.

## 2021-11-28 ENCOUNTER — Ambulatory Visit: Payer: 59 | Admitting: Internal Medicine

## 2021-12-17 ENCOUNTER — Encounter: Payer: Self-pay | Admitting: Internal Medicine

## 2021-12-17 ENCOUNTER — Ambulatory Visit: Payer: 59 | Admitting: Internal Medicine

## 2021-12-17 VITALS — BP 110/70 | HR 76 | Temp 98.0°F | Wt 98.8 lb

## 2021-12-17 DIAGNOSIS — M25521 Pain in right elbow: Secondary | ICD-10-CM | POA: Diagnosis not present

## 2021-12-17 NOTE — Progress Notes (Signed)
Established Patient Office Visit     CC/Reason for Visit: Discuss right elbow pain  HPI: Natalie Leblanc is a 31 y.o. female who is coming in today for the above mentioned reasons.  She states she suffered an injury at work about a year ago.  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  She is wondering what she can do.   Past Medical/Surgical History: Past Medical History:  Diagnosis Date   Depression    PTSD (post-traumatic stress disorder)     Past Surgical History:  Procedure Laterality Date   BREAST LUMPECTOMY Left    benign per patient    Social History:  reports that she has never smoked. She has never used smokeless tobacco. She reports current alcohol use. She reports that she does not use drugs.  Allergies: Allergies  Allergen Reactions   Fish Allergy     GI upset   Other Itching    Animal Dander    Family History:  Family History  Problem Relation Age of Onset   Gestational diabetes Mother    Alcohol abuse Mother    Mental illness Mother    Eczema Sister    Depression Sister    Psychosis Maternal Aunt    Thyroid disease Sister    Depression Sister    Heart murmur Sister      Current Outpatient Medications:    hydrOXYzine (ATARAX) 25 MG tablet, Take 25 mg by mouth at bedtime as needed., Disp: , Rfl:    Multiple Vitamin (MULTIVITAMIN) capsule, Take 1 capsule by mouth daily., Disp: , Rfl:    sertraline (ZOLOFT) 100 MG tablet, Take 100 mg by mouth daily., Disp: , Rfl:    triamcinolone cream (KENALOG) 0.1 %, Apply 1 application topically 2 (two) times daily., Disp: 30 g, Rfl: 0   Vitamin D, Ergocalciferol, (DRISDOL) 1.25 MG (50000 UNIT) CAPS capsule, Take 1 capsule (50,000 Units total) by mouth every 7 (seven) days for 12 doses., Disp: 12 capsule, Rfl: 0  Review of Systems:  Constitutional: Denies fever, chills,  diaphoresis, appetite change and fatigue.  HEENT: Denies photophobia, eye pain, redness, hearing loss, ear pain, congestion, sore throat, rhinorrhea, sneezing, mouth sores, trouble swallowing, neck pain, neck stiffness and tinnitus.   Respiratory: Denies SOB, DOE, cough, chest tightness,  and wheezing.   Cardiovascular: Denies chest pain, palpitations and leg swelling.  Gastrointestinal: Denies nausea, vomiting, abdominal pain, diarrhea, constipation, blood in stool and abdominal distention.  Genitourinary: Denies dysuria, urgency, frequency, hematuria, flank pain and difficulty urinating.  Endocrine: Denies: hot or cold intolerance, sweats, changes in hair or nails, polyuria, polydipsia. Musculoskeletal: Denies myalgias, back pain, joint swelling,  and gait problem.  Skin: Denies pallor, rash and wound.  Neurological: Denies dizziness, seizures, syncope, weakness, light-headedness, numbness and headaches.  Hematological: Denies adenopathy. Easy bruising, personal or family bleeding history  Psychiatric/Behavioral: Denies suicidal ideation, mood changes, confusion, nervousness, sleep disturbance and agitation    Physical Exam: Vitals:   12/17/21 1128  BP: 110/70  Pulse: 76  Temp: 98 F (36.7 C)  TempSrc: Oral  SpO2: 98%  Weight: 98 lb 12.8 oz (44.8 kg)    Body mass index is 19.96 kg/m.   Constitutional: NAD, calm, comfortable Eyes: PERRL, lids and conjunctivae normal ENMT: Mucous membranes are moist.  Abdomen: no tenderness, no masses palpated. No hepatosplenomegaly. Bowel  sounds positive.  Musculoskeletal: Pain to palpation about 2 cm above the right medial epicondyles..   Impression and Plan:  Right elbow pain - Plan: Ambulatory referral to Sports Medicine    Time spent:20 minutes reviewing chart, interviewing and examining patient and formulating plan of care.     Chaya Jan, MD [facility]

## 2021-12-19 ENCOUNTER — Ambulatory Visit: Payer: 59 | Admitting: Sports Medicine

## 2021-12-19 NOTE — Progress Notes (Unsigned)
    Natalie Leblanc [facility] [address] [place] [address] Phone: [phone]   Assessment and Plan:     There are no diagnoses linked to this encounter.  ***   Pertinent previous records reviewed include ***   Follow Up: ***     Subjective:   I, Natalie Leblanc, am serving as a Neurosurgeon for Doctor Richardean Sale  Chief Complaint: right elbow pain   HPI:   12/24/2021 Patient is a 31 year old female complaining of right elbow pain. Patient states she suffered an injury at work about a year ago.  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  She is wondering what she can do  Relevant Historical Information: ***  Additional pertinent review of systems negative.   Current Outpatient Medications:    hydrOXYzine (ATARAX) 25 MG tablet, Take 25 mg by mouth at bedtime as needed., Disp: , Rfl:    meloxicam (MOBIC) 15 MG tablet, Take 1 tablet (15 mg total) by mouth daily., Disp: 30 tablet, Rfl: 0   Multiple Vitamin (MULTIVITAMIN) capsule, Take 1 capsule by mouth daily., Disp: , Rfl:    sertraline (ZOLOFT) 100 MG tablet, Take 100 mg by mouth daily., Disp: , Rfl:    triamcinolone cream (KENALOG) 0.1 %, Apply 1 application topically 2 (two) times daily., Disp: 30 g, Rfl: 0   Vitamin D, Ergocalciferol, (DRISDOL) 1.25 MG (50000 UNIT) CAPS capsule, Take 1 capsule (50,000 Units total) by mouth every 7 (seven) days for 12 doses., Disp: 12 capsule, Rfl: 0   Objective:     Vitals:   12/24/21 1107  BP: 100/68  Weight: 98 lb (44.5 kg)  Height: 4\' 11"  (1.499 m)      Body mass index is 19.79 kg/m.    Physical Exam:    ***   Electronically signed by:  D.[facility] 11:34 AM 12/24/21

## 2021-12-24 ENCOUNTER — Ambulatory Visit: Payer: 59 | Admitting: Sports Medicine

## 2021-12-24 ENCOUNTER — Ambulatory Visit (INDEPENDENT_AMBULATORY_CARE_PROVIDER_SITE_OTHER): Payer: 59

## 2021-12-24 VITALS — BP 100/68 | Ht 59.0 in | Wt 98.0 lb

## 2021-12-24 DIAGNOSIS — M25521 Pain in right elbow: Secondary | ICD-10-CM | POA: Diagnosis not present

## 2021-12-24 DIAGNOSIS — M7701 Medial epicondylitis, right elbow: Secondary | ICD-10-CM

## 2021-12-24 MED ORDER — MELOXICAM 15 MG PO TABS: 15.0000 mg | ORAL_TABLET | Freq: Every day | ORAL | 0 refills | Status: DC

## 2021-12-24 NOTE — Progress Notes (Deleted)
    Natalie Leblanc D.Natalie Leblanc Phone: [phone]   Assessment and Plan:     There are no diagnoses linked to this encounter.  ***   Pertinent previous records reviewed include ***   Follow Up: ***     Subjective:                  Natalie Leblanc Natalie Leblanc Phone: [phone]   Assessment and Plan:     There are no diagnoses linked to this encounter.  ***   Pertinent previous records reviewed include ***   Follow Up: ***     Subjective:   I, Natalie Leblanc, am serving as a Education administrator for [name]  Chief Complaint: right elbow pain   HPI:   12/24/21 Patient is a 31 year old female complaining of right elbow pain. Patient states  Relevant Historical Information: ***  Additional pertinent review of systems negative.   Current Outpatient Medications:    hydrOXYzine (ATARAX) 25 MG tablet, Take 25 mg by mouth at bedtime as needed., Disp: , Rfl:    Multiple Vitamin (MULTIVITAMIN) capsule, Take 1 capsule by mouth daily., Disp: , Rfl:    sertraline (ZOLOFT) 100 MG tablet, Take 100 mg by mouth daily., Disp: , Rfl:    triamcinolone cream (KENALOG) 0.1 %, Apply 1 application topically 2 (two) times daily., Disp: 30 g, Rfl: 0   Vitamin D, Ergocalciferol, (DRISDOL) 1.25 MG (50000 UNIT) CAPS capsule, Take 1 capsule (50,000 Units total) by mouth every 7 (seven) days for 12 doses., Disp: 12 capsule, Rfl: 0   Objective:     There were no vitals filed for this visit.    There is no height or weight on file to calculate BMI.    Physical Exam:    ***   Electronically signed by:  Natalie Leblanc D.[facility] 9:02 AM 12/24/21  Chief Complaint: ***  HPI:   12/24/21 ***  Relevant Historical Information: ***  Additional pertinent review of systems negative.   Current Outpatient Medications:     hydrOXYzine (ATARAX) 25 MG tablet, Take 25 mg by mouth at bedtime as needed., Disp: , Rfl:    Multiple Vitamin (MULTIVITAMIN) capsule, Take 1 capsule by mouth daily., Disp: , Rfl:    sertraline (ZOLOFT) 100 MG tablet, Take 100 mg by mouth daily., Disp: , Rfl:    triamcinolone cream (KENALOG) 0.1 %, Apply 1 application topically 2 (two) times daily., Disp: 30 g, Rfl: 0   Vitamin D, Ergocalciferol, (DRISDOL) 1.25 MG (50000 UNIT) CAPS capsule, Take 1 capsule (50,000 Units total) by mouth every 7 (seven) days for 12 doses., Disp: 12 capsule, Rfl: 0   Objective:     There were no vitals filed for this visit.    There is no height or weight on file to calculate BMI.    Physical Exam:    ***   Electronically signed by:  Natalie Leblanc D.[facility] 9:01 AM 12/24/21

## 2021-12-24 NOTE — Patient Instructions (Addendum)
Good to see you  Work note provided - Start meloxicam 15 mg daily x2 weeks.  If still having pain after 2 weeks, complete 3rd-week of meloxicam. May use remaining meloxicam as needed once daily for pain control.  Do not to use additional NSAIDs while taking meloxicam.  May use Tylenol [phone] mg 2 to 3 times a day for breakthrough pain. - elbow HEP  - Follow-up in 3 weeks for reevaluation

## 2021-12-24 NOTE — Progress Notes (Signed)
Benito Mccreedy D.[place] Phone: [phone]   Assessment and Plan:     1. Right elbow pain 2. 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  My interpretation: No acute fracture or dislocation.  Unremarkable imaging - Start HEP for medial epicondylitis - Work note stating: No lifting >10 pounds, patient is able to wear wrist brace at all times during work for the next 3 weeks or until reevaluated.  If these accommodations cannot be met, then patient should not have to work. - Start meloxicam 15 mg daily x2 weeks.  If still having pain after 2 weeks, complete 3rd-week of meloxicam. May use remaining meloxicam as needed once daily for pain control.  Do not to use additional NSAIDs while taking meloxicam.  May use Tylenol [phone] mg 2 to 3 times a day for breakthrough pain. -Recommend using wrist brace that limits flexion extension at wrist but does not limit thumb motion.  Patient can choose to use ours in clinic or can find 1 online or OTC    Pertinent previous records reviewed include none   Follow Up: 3 weeks for reevaluation.  Could consider ultrasound versus  ECSWT   Subjective:   I, Natalie Leblanc, am serving as a Education administrator for Doctor Glennon Mac  Chief Complaint: right elbow pain   HPI:   12/24/21 Patient is a 31 year old female complaining of right elbow pain . Patient states  she suffered an injury at work about a year ago.  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  She is wondering what she can do. She states that the task of lifting the boxes undid  all of her healing . Looks for new restrictions .  Went to work place occupational therapy for 3 weeks.   No numbness or tingling in the elbow , feels like she can hear her elbow grinding, no meds for the pain, is not able to lift things with out pain, one of the meds she takes makes her drowsy, hurts her when she extends her arm   Relevant Historical Information: None pertinent  Additional pertinent review of systems negative.   Current Outpatient Medications:    hydrOXYzine (ATARAX) 25 MG tablet, Take 25 mg by mouth at bedtime as needed., Disp: , Rfl:    meloxicam (MOBIC) 15 MG tablet, Take 1 tablet (15 mg total) by mouth daily., Disp: 30 tablet, Rfl: 0   Multiple Vitamin (MULTIVITAMIN) capsule, Take 1 capsule by mouth daily., Disp: , Rfl:    sertraline (ZOLOFT) 100 MG tablet, Take 100 mg by mouth daily., Disp: , Rfl:    triamcinolone cream (KENALOG) 0.1 %, Apply 1 application topically 2 (two) times daily., Disp: 30 g, Rfl: 0   Vitamin D, Ergocalciferol, (DRISDOL) 1.25 MG (50000 UNIT) CAPS capsule, Take 1 capsule (50,000 Units total) by mouth every 7 (seven) days for 12 doses., Disp: 12 capsule, Rfl: 0   Objective:     Vitals:   12/24/21 1107  BP: 100/68  Weight: 98 lb (44.5 kg)  Height: 4' 11" (1.499 m)      Body mass index  is 19.79 kg/m.    Physical Exam:    General: Appears well, no acute distress, nontoxic and pleasant Neck: FROM, no pain Neuro: sensation is intact distally with no deficits, strenghth is 5/5 in elbow flexors/extenders/supinator/pronators and wrist flexors/extensors Psych: no evidence of anxiety or depression  Right ELBOW: no deformity, swelling or muscle wasting Normal Carrying angle ROM:0-140, supination and pronation 90 TTP medial epicondyle NTTP over triceps, ticeps tendon, olecronon, lat epicondyle,  antecubital fossa, biceps tendon, supinator, pronator Negative tinnels over cubital tunnel No pain with resisted wrist and middle digit extension    pain with resisted wrist flexion   pain with resisted supination No pain with resisted pronation Negative valgus stress Negative varus stress Negative milking maneuver    Electronically signed by:  Benito Mccreedy D.[facility] 11:33 AM 12/24/21

## 2021-12-26 ENCOUNTER — Encounter: Payer: Self-pay | Admitting: Sports Medicine

## 2021-12-26 ENCOUNTER — Encounter: Payer: PRIVATE HEALTH INSURANCE | Admitting: Internal Medicine

## 2021-12-26 ENCOUNTER — Telehealth: Payer: Self-pay

## 2021-12-26 NOTE — Telephone Encounter (Signed)
Caller states that she is having an allergic reaction, possibly from taking a new medication. Her skin feels hot, itchy, and prickly. She had a hard time sleeping last night. --possible reaction to meloxicam. Started it on tuesday  12/26/2021 10:13:22 AM See PCP within 24 Hours Turner, RN, Rebekah  Referrals REFERRED TO PCP OFFICE  Pt has appt with PCP today.

## 2022-01-13 NOTE — Progress Notes (Unsigned)
    Benito Mccreedy D.[place] Phone: [phone]   Assessment and Plan:     There are no diagnoses linked to this encounter.  ***   Pertinent previous records reviewed include ***   Follow Up: ***     Subjective:   I, Jackey Housey, am serving as a Education administrator for Doctor Glennon Mac   Chief Complaint: right elbow pain    HPI:    12/24/21 Patient is a 31 year old female complaining of right elbow pain . Patient states  she suffered an injury at work about a year ago.  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  She is wondering what she can do. She states that the task of lifting the boxes undid all of her healing . Looks for new restrictions .  Went to work place occupational therapy for 3 weeks.    No numbness or tingling in the elbow , feels like she can hear her elbow grinding, no meds for the pain, is not able to lift things with out pain, one of the meds she takes makes her drowsy, hurts her when she extends her arm     01/14/2022 Patient states  Relevant Historical Information: None pertinent  Additional pertinent review of systems negative.   Current Outpatient Medications:    hydrOXYzine (ATARAX) 25 MG tablet, Take 25 mg by mouth at bedtime as needed., Disp: , Rfl:    meloxicam (MOBIC) 15 MG tablet, Take 1 tablet (15 mg total) by mouth daily., Disp: 30 tablet, Rfl: 0   Multiple Vitamin (MULTIVITAMIN) capsule, Take 1 capsule by mouth daily., Disp: , Rfl:    sertraline (ZOLOFT) 100 MG tablet, Take 100 mg by mouth daily., Disp: , Rfl:    triamcinolone cream (KENALOG) 0.1 %, Apply 1 application topically 2 (two) times daily., Disp: 30 g, Rfl: 0   Objective:     There were no vitals filed for this visit.    There is no height or weight on file to calculate BMI.     Physical Exam:    ***   Electronically signed by:  Benito Mccreedy D.[facility] 12:17 PM 01/13/22

## 2022-01-14 ENCOUNTER — Ambulatory Visit: Payer: 59 | Admitting: Sports Medicine

## 2022-01-14 VITALS — BP 110/78 | HR 67 | Ht 59.0 in | Wt 100.0 lb

## 2022-01-14 DIAGNOSIS — M7701 Medial epicondylitis, right elbow: Secondary | ICD-10-CM

## 2022-01-14 DIAGNOSIS — M25521 Pain in right elbow: Secondary | ICD-10-CM | POA: Diagnosis not present

## 2022-01-14 MED ORDER — IBUPROFEN 600 MG PO TABS: 600.0000 mg | ORAL_TABLET | Freq: Three times a day (TID) | ORAL | 0 refills | Status: DC

## 2022-01-14 NOTE — Patient Instructions (Addendum)
Good to see you  Discontinue meloxicam  Start ibuprofen  600 mg 3x a time day  PT referral  Work note no lifting greater than 10 pounds and should be able to wear wrist brace at all times for the next 4 weeks or  3-4 week follow up

## 2022-01-22 ENCOUNTER — Telehealth: Payer: Self-pay | Admitting: Sports Medicine

## 2022-01-22 NOTE — Telephone Encounter (Signed)
New note was sent via my chart and a note was placed at the front desk

## 2022-01-22 NOTE — Telephone Encounter (Signed)
Pt having some trouble with her employer working with her most recent lifting restrictions. Pt requesting that we change weight restriction from 10 to 15 lbs. She thinks she can handle this and that it will help her continue working and not go out on [employer].  Pt can retrieve via MyChart.

## 2022-01-27 ENCOUNTER — Ambulatory Visit: Payer: 59

## 2022-01-28 NOTE — Therapy (Incomplete)
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  Rationale for Evaluation and Treatment Rehabilitation  ONSET DATE: ***  SUBJECTIVE:                                                                                                                                                                                      SUBJECTIVE STATEMENT: ***  PERTINENT HISTORY: Depression PTSD  PAIN:  Are you having pain? Yes: NPRS scale: ***/10 Pain location: *** Pain description: *** Aggravating factors: *** Relieving factors: ***  PRECAUTIONS: None  WEIGHT BEARING RESTRICTIONS: No  FALLS:  Has patient fallen in last 6 months? {fallsyesno:27318}  LIVING ENVIRONMENT: Lives with: {[facility] lives with:25569::"lives with their family"} Lives in: {Lives in:25570} Stairs: {opstairs:27293} Has following equipment at home: {Assistive devices:23999}  OCCUPATION: ***  PLOF: {PLOF:24004}  PATIENT GOALS: ***  OBJECTIVE:   DIAGNOSTIC FINDINGS:  Rt Elbow X-ray: IMPRESSION: Negative radiographs of the right elbow.  PATIENT SURVEYS:  FOTO ***  COGNITION: Overall cognitive status:  {cognition:24006}     SENSATION: {sensation:27233}  POSTURE: ***  UPPER EXTREMITY ROM:   Active ROM Right eval Left eval  Shoulder flexion    Shoulder extension    Shoulder abduction    Shoulder adduction    Shoulder internal rotation    Shoulder external rotation    Elbow flexion    Elbow extension    Wrist flexion    Wrist extension    Wrist ulnar deviation    Wrist radial deviation    Wrist pronation    Wrist supination    (Blank rows = not tested)  UPPER EXTREMITY MMT:  MMT Right eval Left eval  Shoulder flexion    Shoulder extension    Shoulder abduction    Shoulder adduction    Shoulder internal rotation    Shoulder external rotation    Middle trapezius  Lower trapezius    Elbow flexion    Elbow extension    Wrist flexion    Wrist extension    Wrist ulnar deviation    Wrist radial deviation    Wrist pronation    Wrist supination    Grip strength (lbs)    (Blank rows = not tested)  SPECIAL TESTS:   JOINT MOBILITY TESTING:  ***  PALPATION:  ***  [facility] Adult PT Treatment:                                                DATE: 01/28/22 Therapeutic Exercise: Demonstrated and issue initial HEP.    Therapeutic Activity: Education on assessment findings that will be addressed throughout duration of POC.     PATIENT EDUCATION: Education details: see treatment Person educated: Patient Education method: Explanation, Demonstration, Tactile cues, Verbal cues, and Handouts Education comprehension: verbalized understanding, returned demonstration, verbal cues required, tactile cues required, and needs further education  HOME EXERCISE PROGRAM: ***  ASSESSMENT:  CLINICAL IMPRESSION: Patient is a 31 y.o. female who was seen today for physical therapy evaluation and treatment for Rt elbow pain.    OBJECTIVE IMPAIRMENTS: {opptimpairments:25111}.   ACTIVITY LIMITATIONS: {activitylimitations:27494}  PARTICIPATION LIMITATIONS:  {participationrestrictions:25113}  PERSONAL FACTORS: {Personal factors:25162} are also affecting patient's functional outcome.   REHAB POTENTIAL: {rehabpotential:25112}  CLINICAL DECISION MAKING: {clinical decision making:25114}  EVALUATION COMPLEXITY: {Evaluation complexity:25115}  GOALS: Goals reviewed with patient? Yes  SHORT TERM GOALS: Target date: {follow up:25551}  (Remove Blue Hyperlink)  *** Baseline: Goal status: {GOALSTATUS:25110}  2.  *** Baseline:  Goal status: {GOALSTATUS:25110}  3.  *** Baseline:  Goal status: {GOALSTATUS:25110}  4.  *** Baseline:  Goal status: {GOALSTATUS:25110}  5.  *** Baseline:  Goal status: {GOALSTATUS:25110}  6.  *** Baseline:  Goal status: {GOALSTATUS:25110}  LONG TERM GOALS: Target date: {follow up:25551}  (Remove Blue Hyperlink)  *** Baseline:  Goal status: {GOALSTATUS:25110}  2.  *** Baseline:  Goal status: {GOALSTATUS:25110}  3.  *** Baseline:  Goal status: {GOALSTATUS:25110}  4.  *** Baseline:  Goal status: {GOALSTATUS:25110}  5.  *** Baseline:  Goal status: {GOALSTATUS:25110}  6.  *** Baseline:  Goal status: {GOALSTATUS:25110}  PLAN: PT FREQUENCY: {rehab frequency:25116}  PT DURATION: {rehab duration:25117}  PLANNED INTERVENTIONS: {rehab planned interventions:25118::"Therapeutic exercises","Therapeutic activity","Neuromuscular re-education","Balance training","Gait training","Patient/Family education","Self Care","Joint mobilization"}  PLAN FOR NEXT SESSION: ***  Letitia Libra, PT, DPT, ATC 01/29/22 10:50 AM

## 2022-01-29 ENCOUNTER — Ambulatory Visit: Payer: 59 | Attending: Sports Medicine

## 2022-01-29 DIAGNOSIS — M25521 Pain in right elbow: Secondary | ICD-10-CM | POA: Insufficient documentation

## 2022-01-29 DIAGNOSIS — M6281 Muscle weakness (generalized): Secondary | ICD-10-CM | POA: Insufficient documentation

## 2022-01-31 ENCOUNTER — Other Ambulatory Visit: Payer: Self-pay

## 2022-01-31 ENCOUNTER — Ambulatory Visit: Payer: 59 | Admitting: Physical Therapy

## 2022-01-31 ENCOUNTER — Encounter: Payer: Self-pay | Admitting: Physical Therapy

## 2022-01-31 DIAGNOSIS — M6281 Muscle weakness (generalized): Secondary | ICD-10-CM

## 2022-01-31 DIAGNOSIS — M25521 Pain in right elbow: Secondary | ICD-10-CM

## 2022-01-31 NOTE — Therapy (Signed)
OUTPATIENT PHYSICAL THERAPY SHOULDER EVALUATION   Patient Name: Natalie Leblanc MRN: [id number] DOB:October 22, 1990, 31 y.o., female Today's Date: 01/31/2022   PT End of Session - 01/31/22 1204     Visit Number 1    Number of Visits --   1-2x/week   Date for PT Re-Evaluation 03/28/22    Authorization Type [employer] - FOTO    PT Start Time 1140    PT Stop Time 1210    PT Time Calculation (min) 30 min             Past Medical History:  Diagnosis Date   Depression    PTSD (post-traumatic stress disorder)    Past Surgical History:  Procedure Laterality Date   BREAST LUMPECTOMY Left    benign per patient   Patient Active Problem List   Diagnosis Date Noted   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 No, Number of falls: 0  MOI/History of condition:  Onset date: September 2023  SUBJECTIVE STATEMENT  Natalie Leblanc is a 31 y.o. female who presents to clinic with chief complaint of R medial elbow pain which started at work in September.  She has to lift a large box overhead and push it into a machine.  The pain started suddenly and was "uncomfortable" this progressed to significant pain within an hour.  This is not typically her job.  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  She denies n/t.  She denies neck pain.  She continues to work.  From referring provider:    "12/24/21 Patient is a 31 year old female complaining of right elbow pain . Patient states  she suffered an injury at work about a year ago.  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  She is wondering what she can do. She states that the task of lifting the boxes undid all of her healing . Looks for new restrictions .  Went to work place occupational therapy for 3 weeks.    No numbness or tingling in the elbow , feels like she can hear her elbow grinding, no meds for the pain, is not able to lift things with out pain, one of the meds she takes makes her drowsy, hurts her when she extends her arm      01/14/2022 Patient states that its a little better but if we take the restrictions off she thinks the pain will come back    Relevant Historical Information: None pertinent   Additional pertinent review of systems negative.  "   Red flags:  denies   Pain:  Are you having pain? 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  Assistive Device: none  Hand Dominance: R  Patient Goals/Specific Activities: reduce pain, be able to work without pain   OBJECTIVE:       SENSATION:  Light touch: Appears intact   PALPATION: Exquisite TTP medial condyle R elbow  Neck ROM WNL with no pain  UPPER EXTREMITY AROM:  ROM Right 01/31/2022 Left 01/31/2022  Wrist flexion N* N  Wrist ext N* N  Wrist pronation    Wrist supination    Elbow flexion    Elbow ext     (Blank rows = not tested, N = WNL, * = concordant pain with  testing)  UPPER EXTREMITY MMT:  MMT Right 01/31/2022 Left 01/31/2022  Wrist flexion 4* n  Wrist ext n n  Wrist pronation N* n  Wrist supination N* n  Elbow flexion 3+* n  Elbow ext 3+* n  Grip strength 16 kg 14 kg   (Blank rows = not tested, score listed is out of 5 possible points.  N = WNL, D = diminished, C = clear for gross weakness with myotome testing, * = concordant pain with testing)  PATIENT SURVEYS:  FOTO: 50 ->  63    TODAY'S TREATMENT:  Creating, reviewing, and completing below HEP    PATIENT EDUCATION:  POC, diagnosis, prognosis, HEP, and outcome measures.  Pt educated via explanation, demonstration, and handout (HEP).  Pt confirms understanding verbally.   HOME EXERCISE PROGRAM: Access Code: [email or web address] URL: [email or web address] Date: 01/31/2022 Prepared by: Alphonzo Severance  Program Notes complete at least 1x/day and aim for a pain level of about 4/10.  Avoid a pain level higher than 4/10.  Exercises - Wrist Flexion with Resistance  - 1 x daily - 7 x weekly - 3 sets - 10 reps  ASTERISK SIGNS   Asterisk Signs Eval (01/31/2022)       Pain at work 10/10       Wrist flexion MMT 3+*                                 ASSESSMENT:  CLINICAL IMPRESSION: Natalie Leblanc is a 31 y.o. female who presents to clinic with signs and sxs consistent with R medial elbow pain secondary to strain of common flexor tendon.  Per subjective this is more consistent with an acute injury while working than a chronic overuse injury.    OBJECTIVE IMPAIRMENTS: Pain, wrist and elbow strength  ACTIVITY LIMITATIONS: lifting, reaching, working  PERSONAL FACTORS: See medical history and pertinent history   REHAB POTENTIAL: Good  CLINICAL DECISION MAKING: Stable/uncomplicated  EVALUATION COMPLEXITY: Low   GOALS:   SHORT TERM GOALS: Target date: 02/28/2022  Natalie Leblanc will be >75% HEP compliant to improve carryover between sessions and facilitate independent  management of condition  Evaluation (01/31/2022): ongoing Goal status: INITIAL   LONG TERM GOALS: Target date: 03/28/2022  Natalie Leblanc will improve FOTO score to 63 as a proxy for functional improvement  Evaluation/Baseline (01/31/2022): 50 Goal status: INITIAL   2.  Natalie Leblanc will self report >/= 50% decrease in pain from evaluation   Evaluation/Baseline (01/31/2022): 10/10 max pain Goal status: INITIAL   3.  Flavia will improve the following MMTs to >/= 4+/5 to show improvement in strength:   Evaluation/Baseline (  01/31/2022):   UPPER EXTREMITY MMT:  MMT Right 01/31/2022 Left 01/31/2022  Wrist flexion 4* n  Wrist ext n n  Wrist pronation N* n  Wrist supination N* n  Elbow flexion 3+* n  Elbow ext 3+* n  Grip strength 16 kg 14 kg   (Blank rows = not tested, score listed is out of 5 possible points.  N = WNL, D = diminished, C = clear for gross weakness with myotome testing, * = concordant pain with testing)  Goal status: INITIAL   4.  Manasvi will be able to work, not limited by pain  Evaluation/Baseline (01/31/2022): limited Goal status: INITIAL    PLAN: PT FREQUENCY: 1-2x/week  PT DURATION: 8 weeks (Ending 03/28/2022)  PLANNED INTERVENTIONS: Therapeutic exercises, Aquatic therapy, Therapeutic activity, Neuro Muscular re-education, Gait training, Patient/Family education, Joint mobilization, Dry Needling, Electrical stimulation, Spinal mobilization and/or manipulation, Moist heat, Taping, Vasopneumatic device, Ionotophoresis 4mg /ml Dexamethasone, and Manual therapy  PLAN FOR NEXT SESSION: progressive wrist flexor loading, global strengthening of elbow and shoulder girdle   Shearon Balo PT, DPT 01/31/2022, 12:18 PM

## 2022-02-06 ENCOUNTER — Telehealth: Payer: Self-pay

## 2022-02-06 ENCOUNTER — Ambulatory Visit: Payer: 59 | Attending: Sports Medicine

## 2022-02-06 DIAGNOSIS — M25521 Pain in right elbow: Secondary | ICD-10-CM | POA: Insufficient documentation

## 2022-02-06 DIAGNOSIS — M6281 Muscle weakness (generalized): Secondary | ICD-10-CM | POA: Insufficient documentation

## 2022-02-06 NOTE — Telephone Encounter (Signed)
Spoke with pt regarding her 1st no show. Discussed the clinic attendance policy and confirmed her next appointment.

## 2022-02-06 NOTE — Progress Notes (Signed)
Natalie Leblanc D.[place] Phone: [phone]   Assessment and Plan:     1. Right elbow pain 2. 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  Physical exam equivocal with resisted wrist extension and third digit extension causing discomfort over medial elbow, however resisted wrist flexion did not cause symptoms - Patient has been referred by her work to see a Sport and exercise psychologist. physician.  I recommend continuing follow-up with that physician - Patient may continue HEP and physical therapy - May continue use of wrist brace to decrease tension on elbow - Discussed alternative options such as ECSWT, advanced imaging, CSI, and patient wishes to continue with conservative therapy at this time  Pertinent previous records reviewed include none   Follow Up: 2 months for reevaluation.  Could discuss alternative treatment options   Subjective:   I, Natalie Leblanc, am serving as a Education administrator for Doctor Glennon Mac   Chief Complaint: right elbow pain    HPI:    12/24/21 Patient is a 31 year old female complaining of right elbow pain . Patient states  she suffered an injury at work about a year ago.  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  She is wondering what she can do. She states that the task of lifting the boxes undid all of her healing . Looks for new restrictions .  Went to work place occupational therapy for 3 weeks.    No numbness or tingling in the elbow , feels like she can hear her elbow grinding, no meds for the pain, is not able to lift things with out pain, one of the meds she takes makes her drowsy, hurts her when she  extends her arm      01/14/2022 Patient states that its a little better but if we take the restrictions off she thinks the pain will come back    02/10/2022 Patient states that its probably a little better  but she keeps hitting it on things  Relevant Historical Information: None pertinent  Additional pertinent review of systems negative.   Current Outpatient Medications:    hydrOXYzine (ATARAX) 25 MG tablet, Take 25 mg by mouth at bedtime as needed., Disp: , Rfl:    ibuprofen (ADVIL) 600 MG tablet, Take 1 tablet (600 mg total) by mouth 3 (three) times daily., Disp: 60 tablet, Rfl: 0   meloxicam (MOBIC) 15 MG tablet, Take 1 tablet (15 mg total) by mouth daily., Disp: 30 tablet, Rfl: 0   Multiple Vitamin (MULTIVITAMIN) capsule, Take 1 capsule by mouth daily., Disp: , Rfl:    sertraline (ZOLOFT) 100 MG tablet, Take 100 mg by mouth daily., Disp: , Rfl:    triamcinolone cream (KENALOG) 0.1 %, Apply 1 application topically 2 (two) times daily., Disp: 30 g, Rfl: 0   Objective:     Vitals:   02/10/22 1323  Pulse: 69  SpO2: 99%  Weight: 98 lb (44.5 kg)  Height: 4\' 11"  (1.499 m)      Body mass index is 19.79 kg/m.    Physical Exam:    General: Appears well, no acute distress, nontoxic and pleasant Neck: FROM, no pain Neuro: sensation is intact  distally with no deficits, strenghth is 5/5 in elbow flexors/extenders/supinator/pronators and wrist flexors/extensors Psych: no evidence of anxiety or depression   Right ELBOW: no deformity, swelling or muscle wasting Normal Carrying angle ROM:0-140, supination and pronation 90 TTP mildly medial epicondyle NTTP over triceps, ticeps tendon, olecronon, lat epicondyle,  antecubital fossa, biceps tendon, supinator, pronator Negative tinnels over cubital tunnel Mild medial elbow discomfort with resisted wrist and middle digit extension No pain with resisted wrist flexion Trace pain with resisted supination No pain with resisted  pronation Negative valgus stress Negative varus stress Negative milking maneuver     Electronically signed by:  Natalie Leblanc D.[facility] 1:43 PM 02/10/22

## 2022-02-06 NOTE — Therapy (Incomplete)
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  Rationale for Evaluation and Treatment Rehabilitation  PERTINENT HISTORY: PTSD    SUBJECTIVE:                                                                                                                                                                                      SUBJECTIVE STATEMENT:  ***   PAIN:  Are you having pain? Yes Pain location: Medial elbow NPRS scale:  current 2/10  average 10/10  Aggravating factors: working, lifting, carrying objects, videogames           NPRS, highest: 10/10 Relieving factors: rest           NPRS: best: 2/10 Pain description: constant, sharp, and aching Stage: Subacute Stability: staying the same 24 hour pattern: no clear pattern    OBJECTIVE: (objective measures completed at initial evaluation unless otherwise dated)   SENSATION:          Light touch: Appears intact           PALPATION: Exquisite TTP medial condyle R elbow   Neck ROM WNL with no pain   UPPER EXTREMITY AROM:   ROM Right 01/31/2022 Left 01/31/2022  Wrist flexion N* N  Wrist ext N* N  Wrist pronation      Wrist supination       Elbow flexion      Elbow ext        (Blank rows = not tested, N = WNL, * = concordant pain with testing)   UPPER EXTREMITY MMT:   MMT Right 01/31/2022 Left 01/31/2022  Wrist flexion 4* n  Wrist ext n n  Wrist pronation N* n  Wrist supination N* n  Elbow flexion 3+* n  Elbow ext 3+* n  Grip strength 16 kg 14 kg    (Blank  rows = not tested, score listed is out of 5 possible points.  N = WNL, D = diminished, C = clear for gross weakness with myotome testing, * = concordant pain with testing)   PATIENT SURVEYS:  FOTO: 50 ->  63              TODAY'S TREATMENT:  Creating, reviewing, and completing below HEP       PATIENT EDUCATION:  POC, diagnosis, prognosis, HEP, and outcome measures.  Pt educated via explanation, demonstration, and handout (HEP).  Pt confirms understanding verbally.    HOME EXERCISE PROGRAM: Access Code: [email or web address] URL: [email or web address] Date: 01/31/2022 Prepared by: Shearon Balo   Program Notes complete at least 1x/day and aim for a pain level of about 4/10.  Avoid a pain level higher than 4/10.   Exercises - Wrist Flexion with Resistance  - 1 x daily - 7 x weekly - 3 sets - 10 reps   ASTERISK SIGNS     Asterisk Signs Eval (01/31/2022)            Pain at work 10/10            Wrist flexion MMT 3+*                                                             ASSESSMENT:   CLINICAL IMPRESSION: ***    OBJECTIVE IMPAIRMENTS: Pain, wrist and elbow strength   ACTIVITY LIMITATIONS: lifting, reaching, working   PERSONAL FACTORS: See medical history and pertinent history         GOALS:     SHORT TERM GOALS: Target date: 02/28/2022   Kayal will be >75% HEP compliant to improve carryover between sessions and facilitate independent management of condition   Evaluation (01/31/2022): ongoing Goal status: INITIAL     LONG TERM GOALS: Target date: 03/28/2022   Madisson will improve FOTO score to 63 as a proxy for  functional improvement   Evaluation/Baseline (01/31/2022): 50 Goal status: INITIAL     2.  Britteny will self report >/= 50% decrease in pain from evaluation    Evaluation/Baseline (01/31/2022): 10/10 max pain Goal status: INITIAL     3.  Tawana will improve the following MMTs to >/= 4+/5 to show improvement in strength:    Evaluation/Baseline (01/31/2022):    UPPER EXTREMITY MMT:   MMT Right 01/31/2022 Left 01/31/2022  Wrist flexion 4* n  Wrist ext n n  Wrist pronation N* n  Wrist supination N* n  Elbow flexion 3+* n  Elbow ext 3+* n  Grip strength 16 kg 14 kg    (Blank rows = not tested, score listed is out of 5 possible points.  N = WNL, D = diminished, C = clear for gross weakness with myotome testing, * = concordant pain with testing)   Goal status: INITIAL     4.  Leon will be able to work, not limited by pain   Evaluation/Baseline (01/31/2022): limited Goal status: INITIAL       PLAN: PT FREQUENCY: 1-2x/week   PT DURATION: 8 weeks (Ending 03/28/2022)   PLANNED INTERVENTIONS: Therapeutic exercises, Aquatic therapy, Therapeutic activity, Neuro Muscular re-education, Gait training, Patient/Family education, Joint mobilization, Dry Needling, Electrical stimulation, Spinal mobilization and/or manipulation, Moist heat, Taping, Vasopneumatic device, Ionotophoresis 4mg /ml Dexamethasone, and  Manual therapy   PLAN FOR NEXT SESSION: progressive wrist flexor loading, global strengthening of elbow and shoulder girdle   Carmelina Dane, PT, DPT 02/06/22 9:52 AM

## 2022-02-10 ENCOUNTER — Ambulatory Visit: Payer: 59 | Admitting: Sports Medicine

## 2022-02-10 VITALS — HR 69 | Ht 59.0 in | Wt 98.0 lb

## 2022-02-10 DIAGNOSIS — M7701 Medial epicondylitis, right elbow: Secondary | ICD-10-CM

## 2022-02-10 DIAGNOSIS — M25521 Pain in right elbow: Secondary | ICD-10-CM

## 2022-02-10 NOTE — Patient Instructions (Addendum)
Good to see you Continue HEP & PT Continue Ibuprofen until you run out  2 month follow up

## 2022-02-12 NOTE — Therapy (Incomplete)
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  Rationale for Evaluation and Treatment Rehabilitation  PERTINENT HISTORY: PTSD   PRECAUTIONS: None  SUBJECTIVE:                                                                                                                                                                                      SUBJECTIVE STATEMENT:  ***   PAIN:  Are you having pain? Yes Pain location: Medial elbow NPRS scale:  current ***2/10  average 10/10  Aggravating factors: working, lifting, carrying objects, videogames           NPRS, highest: 10/10 Relieving factors: rest           NPRS: best: 2/10 Pain description: constant, sharp, and aching Stage: Subacute Stability: staying the same 24 hour pattern: no clear pattern    OBJECTIVE: (objective measures completed at initial evaluation unless otherwise dated)   SENSATION:          Light touch: Appears intact           PALPATION: Exquisite TTP medial condyle R elbow   Neck ROM WNL with no pain   UPPER EXTREMITY AROM:   ROM Right 01/31/2022 Left 01/31/2022  Wrist flexion N* N  Wrist ext N* N  Wrist pronation       Wrist supination      Elbow flexion      Elbow ext        (Blank rows = not tested, N = WNL, * = concordant pain with testing)   UPPER EXTREMITY MMT:   MMT Right 01/31/2022 Left 01/31/2022  Wrist flexion 4* n  Wrist ext n n  Wrist pronation N* n  Wrist supination N* n  Elbow flexion 3+* n  Elbow ext 3+* n  Grip strength 16 kg 14 kg    (Blank  rows = not tested, score listed is out of 5 possible points.  N = WNL, D = diminished, C = clear for gross weakness with myotome testing, * = concordant pain with testing)   PATIENT SURVEYS:  FOTO: 50 ->  63              TODAY'S TREATMENT:  Creating, reviewing, and completing below HEP       PATIENT EDUCATION:  POC, diagnosis, prognosis, HEP, and outcome measures.  Pt educated via explanation, demonstration, and handout (HEP).  Pt confirms understanding verbally.    HOME EXERCISE PROGRAM: Access Code: [email or web address] URL: [email or web address] Date: 01/31/2022 Prepared by: Alphonzo Severance   Program Notes complete at least 1x/day and aim for a pain level of about 4/10.  Avoid a pain level higher than 4/10.   Exercises - Wrist Flexion with Resistance  - 1 x daily - 7 x weekly - 3 sets - 10 reps   ASTERISK SIGNS     Asterisk Signs Eval (01/31/2022)            Pain at work 10/10            Wrist flexion MMT 3+*                                                           TREATMENT  [facility] Adult PT Treatment:                                                DATE: 02/13/2022 Therapeutic Exercise: UBE level 1 3/3 fwd/bwd Rows Shoulder extension ER/IR Rt  Wrist extension/flexion Elbow flexion/extension Therabar flexion/extension     ASSESSMENT:   CLINICAL IMPRESSION: ***  Natalie Leblanc is a 31 y.o. female who presents to clinic with signs and sxs consistent with R medial elbow pain secondary to strain of common flexor tendon.  Per subjective this is more consistent with an acute injury while working than a chronic  overuse injury.     OBJECTIVE IMPAIRMENTS: Pain, wrist and elbow strength   ACTIVITY LIMITATIONS: lifting, reaching, working   PERSONAL FACTORS: See medical history and pertinent history     REHAB POTENTIAL: Good   CLINICAL DECISION MAKING: Stable/uncomplicated   EVALUATION COMPLEXITY: Low     GOALS:     SHORT TERM GOALS: Target date: 02/28/2022   Natalie Leblanc will be >75% HEP compliant to improve carryover between sessions and facilitate independent management of condition   Evaluation (01/31/2022): ongoing Goal status: INITIAL     LONG TERM GOALS: Target date: 03/28/2022   Natalie Leblanc will improve FOTO score to 63 as a proxy for functional improvement   Evaluation/Baseline (01/31/2022): 50 Goal status: INITIAL     2.  Natalie Leblanc will self report >/= 50% decrease in pain from evaluation    Evaluation/Baseline (01/31/2022): 10/10 max pain Goal status: INITIAL     3.  Natalie Leblanc will improve the following MMTs to >/= 4+/5 to show improvement in strength:    Evaluation/Baseline (01/31/2022):    UPPER EXTREMITY MMT:   MMT Right 01/31/2022 Left 01/31/2022  Wrist flexion 4* n  Wrist ext n n  Wrist pronation N* n  Wrist supination N* n  Elbow flexion 3+* n  Elbow ext 3+* n  Grip strength 16 kg 14 kg    (Blank rows = not tested, score listed is out of 5 possible points.  N = WNL, D = diminished, C = clear for gross weakness with myotome testing, * = concordant pain with testing)   Goal status: INITIAL     4.  Natalie Leblanc will be able to work, not limited by pain   Evaluation/Baseline (01/31/2022): limited Goal status: INITIAL       PLAN: PT FREQUENCY: 1-2x/week   PT DURATION: 8 weeks (Ending 03/28/2022)   PLANNED INTERVENTIONS: Therapeutic exercises, Aquatic therapy, Therapeutic activity, Neuro Muscular re-education, Gait training, Patient/Family education, Joint mobilization, Dry Needling, Electrical stimulation, Spinal mobilization and/or manipulation, Moist heat,  Taping, Vasopneumatic device, Ionotophoresis 4mg /ml Dexamethasone, and Manual therapy   PLAN FOR NEXT SESSION: progressive wrist flexor loading, global strengthening of elbow and shoulder girdle   , PTA 02/12/2022, 4:27 PM

## 2022-02-13 ENCOUNTER — Ambulatory Visit: Payer: 59

## 2022-02-20 ENCOUNTER — Ambulatory Visit: Payer: 59

## 2022-02-20 DIAGNOSIS — M6281 Muscle weakness (generalized): Secondary | ICD-10-CM

## 2022-02-20 DIAGNOSIS — M25521 Pain in right elbow: Secondary | ICD-10-CM | POA: Diagnosis not present

## 2022-02-20 NOTE — Therapy (Addendum)
PHYSICAL THERAPY UNPLANNED DISCHARGE SUMMARY   Visits from Start of Care: 2  Current functional level related to goals / functional outcomes: Current status unknown   Remaining deficits: Current status unknown   Education / Equipment: Pt has not returned since visit listed below  Patient goals were not assessed. Patient is being discharged due to not returning since the last visit.  (the below note was addended to include the above D/C summary on 03/07/22)  OUTPATIENT PHYSICAL THERAPY TREATMENT NOTE   Patient Name: Natalie Leblanc MRN: [id number] DOB:1990-10-13, 31 y.o., female Today's Date: 02/20/2022  PCP: Natalie Leblanc, Natalie Patricia, MD  REFERRING PROVIDER: Philip Leblanc, Natalie Patricia, MD   END OF SESSION:   PT End of Session - 02/20/22 1143     Visit Number 2    Date for PT Re-Evaluation 03/28/22    Authorization Type [employer] - FOTO    PT Start Time 1143   Pt arrived 12 minutes late to her appointment.   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 12/28/2019   Vitamin D deficiency 12/23/2019   Depression    PTSD (post-traumatic stress disorder)     REFERRING DIAG:  M25.521 (ICD-10-CM) - Right elbow pain  M77.01 (ICD-10-CM) - Medial epicondylitis of right elbow    THERAPY DIAG:  Pain in right elbow  Muscle weakness  Rationale for Evaluation and Treatment Rehabilitation  PERTINENT HISTORY: PTSD    SUBJECTIVE:                                                                                                                                                                                       SUBJECTIVE STATEMENT:  Pt reports no pain currently, although she reports continued medial Rt elbow pain at work. She reports HEP adherence.   PAIN:  Are you having pain? Yes Pain location: Medial elbow NPRS scale:  current 0/10  average 6-7/10  Aggravating factors: working, lifting, carrying objects, videogames           NPRS, highest: 6-7/10 Relieving factors: rest           NPRS: best:  0/10 Pain description: constant, sharp, and aching Stage: Subacute Stability: staying the same 24 hour pattern: no clear pattern    OBJECTIVE: (objective measures completed at initial evaluation unless otherwise dated)   SENSATION:          Light touch: Appears intact           PALPATION: Exquisite TTP medial condyle R elbow   Neck ROM WNL with no pain   UPPER EXTREMITY AROM:   ROM Right 01/31/2022 Left 01/31/2022  Wrist flexion N* N  Wrist ext N* N  Wrist pronation      Wrist supination      Elbow flexion      Elbow ext        (Blank rows = not tested, N = WNL, * = concordant pain with testing)   UPPER EXTREMITY MMT:   MMT Right 01/31/2022 Left 01/31/2022  Wrist flexion 4* n  Wrist ext n n  Wrist pronation N* n  Wrist supination N* n  Elbow flexion 3+* n  Elbow ext 3+* n  Grip strength 16 kg 14 kg    (Blank rows = not tested, score listed is out of 5 possible points.  N = WNL, D = diminished, C = clear for gross weakness with myotome testing, * = concordant pain with testing)   PATIENT SURVEYS:  FOTO: 50 ->  63              TODAY'S TREATMENT:   [facility] Adult PT Treatment:                                                DATE: 02/20/2022 Therapeutic Exercise: Seated Rt wrist flexion with yellow therabar 2x10 Seated Rt wrist pronation with yellow therabar 2x10 Seated Rt wrist supination with yellow therabar 2x10 Seated Rt wrist radial deviation with 5# dumbbell 2x10 with slow eccentric  loading Seated Rt wrist flexion with 5# dumbbell 2x10 with slow eccentric loading Seated Rt wrist extension/ radial deviation stretch x69min Standing biceps pulldown with 20# cable 3x10 Manual Therapy: N/A Neuromuscular re-ed: N/A Therapeutic Activity: N/A Modalities: N/A Self Care: N/A        PATIENT EDUCATION:  POC, diagnosis, prognosis, HEP, and outcome measures.  Pt educated via explanation, demonstration, and handout (HEP).  Pt confirms understanding verbally.    HOME EXERCISE PROGRAM: Access Code: [email or web address] URL: [email or web address] Date: 01/31/2022 Prepared by: Natalie Leblanc   Program Notes complete at least 1x/day and aim for a pain level of about 4/10.  Avoid a pain level higher than 4/10.   Exercises - Wrist Flexion with Resistance  - 1 x daily - 7 x weekly - 3 sets - 10 reps   ASTERISK SIGNS     Asterisk Signs Eval (01/31/2022)            Pain at work 10/10            Wrist flexion MMT 3+*                                                             ASSESSMENT:   CLINICAL IMPRESSION: Due to pt arriving 12 minutes late to her  appointment, the session was truncated today. Treatment focused primarily on forearm and wrist strengthening, to which the pt responded well. She will continue to benefit from skilled PT to address her primary impairments and return to her prior level of function with less limitation.     OBJECTIVE IMPAIRMENTS: Pain, wrist and elbow strength   ACTIVITY LIMITATIONS: lifting, reaching, working   PERSONAL FACTORS: See medical history and pertinent history         GOALS:     SHORT TERM GOALS: Target date: 02/28/2022   Natalie Leblanc will be >75% HEP compliant to improve carryover between sessions and facilitate independent management of condition   Evaluation (01/31/2022): ongoing Goal status: INITIAL     LONG TERM GOALS: Target date: 03/28/2022   Natalie Leblanc will improve FOTO score to 63 as a proxy for functional  improvement   Evaluation/Baseline (01/31/2022): 50 Goal status: INITIAL     2.  Natalie Leblanc will self report >/= 50% decrease in pain from evaluation    Evaluation/Baseline (01/31/2022): 10/10 max pain Goal status: INITIAL     3.  Natalie Leblanc will improve the following MMTs to >/= 4+/5 to show improvement in strength:    Evaluation/Baseline (01/31/2022):    UPPER EXTREMITY MMT:   MMT Right 01/31/2022 Left 01/31/2022  Wrist flexion 4* n  Wrist ext n n  Wrist pronation N* n  Wrist supination N* n  Elbow flexion 3+* n  Elbow ext 3+* n  Grip strength 16 kg 14 kg    (Blank rows = not tested, score listed is out of 5 possible points.  N = WNL, D = diminished, C = clear for gross weakness with myotome testing, * = concordant pain with testing)   Goal status: INITIAL     4.  Venera will be able to work, not limited by pain   Evaluation/Baseline (01/31/2022): limited Goal status: INITIAL       PLAN: PT FREQUENCY: 1-2x/week   PT DURATION: 8 weeks (Ending 03/28/2022)   PLANNED INTERVENTIONS: Therapeutic exercises, Aquatic therapy, Therapeutic activity, Neuro Muscular re-education, Gait training, Patient/Family education, Joint mobilization, Dry Needling, Electrical stimulation, Spinal mobilization and/or manipulation, Moist heat, Taping, Vasopneumatic device, Ionotophoresis 4mg /ml Dexamethasone, and Manual therapy   PLAN FOR NEXT SESSION: progressive wrist flexor loading, global strengthening of elbow and shoulder girdle   , PT, DPT 02/20/22 12:10 PM

## 2022-02-25 ENCOUNTER — Telehealth (INDEPENDENT_AMBULATORY_CARE_PROVIDER_SITE_OTHER): Payer: 59 | Admitting: Internal Medicine

## 2022-02-25 VITALS — Wt 98.0 lb

## 2022-02-25 DIAGNOSIS — F330 Major depressive disorder, recurrent, mild: Secondary | ICD-10-CM | POA: Diagnosis not present

## 2022-02-25 DIAGNOSIS — R413 Other amnesia: Secondary | ICD-10-CM | POA: Diagnosis not present

## 2022-02-25 NOTE — Progress Notes (Signed)
Virtual Visit via Video Note  I connected with Natalie Leblanc on 02/25/22 at  2:00 PM EST by a video enabled telemedicine application and verified that I am speaking with the correct person using two identifiers.  Location patient: home Location provider: work office Persons participating in the virtual visit: patient, provider  I discussed the limitations of evaluation and management by telemedicine and the availability of in person appointments. The patient expressed understanding and agreed to proceed.   HPI: She has requested this visit to discuss her continued issues with fatigue and memory loss.  It is started to cause issues at her job.  She does have a history of depression and is followed by psychiatrist.  She did not follow my recommendation at last visit to get a therapist to start CBT.   ROS: Constitutional: Denies fever, chills, diaphoresis, appetite change. HEENT: Denies photophobia, eye pain, redness, hearing loss, ear pain, congestion, sore throat, rhinorrhea, sneezing, mouth sores, trouble swallowing, neck pain, neck stiffness and tinnitus.   Respiratory: Denies SOB, DOE, cough, chest tightness,  and wheezing.   Cardiovascular: Denies chest pain, palpitations and leg swelling.  Gastrointestinal: Denies nausea, vomiting, abdominal pain, diarrhea, constipation, blood in stool and abdominal distention.  Genitourinary: Denies dysuria, urgency, frequency, hematuria, flank pain and difficulty urinating.  Endocrine: Denies: hot or cold intolerance, sweats, changes in hair or nails, polyuria, polydipsia. Musculoskeletal: Denies myalgias, back pain, joint swelling, arthralgias and gait problem.  Skin: Denies pallor, rash and wound.  Neurological: Denies dizziness, seizures, syncope, weakness, light-headedness, numbness and headaches.  Hematological: Denies adenopathy. Easy bruising, personal or family bleeding history  Psychiatric/Behavioral: Denies suicidal ideation,  mood changes, confusion, nervousness, sleep disturbance and agitation   Past Medical History:  Diagnosis Date   Depression    PTSD (post-traumatic stress disorder)     Past Surgical History:  Procedure Laterality Date   BREAST LUMPECTOMY Left    benign per patient    Family History  Problem Relation Age of Onset   Gestational diabetes Mother    Alcohol abuse Mother    Mental illness Mother    Eczema Sister    Depression Sister    Psychosis Maternal Aunt    Thyroid disease Sister    Depression Sister    Heart murmur Sister     SOCIAL HX:   reports that she has never smoked. She has never used smokeless tobacco. She reports current alcohol use. She reports that she does not use drugs.   Current Outpatient Medications:    hydrOXYzine (ATARAX) 25 MG tablet, Take 25 mg by mouth at bedtime as needed., Disp: , Rfl:    ibuprofen (ADVIL) 600 MG tablet, Take 1 tablet (600 mg total) by mouth 3 (three) times daily., Disp: 60 tablet, Rfl: 0   meloxicam (MOBIC) 15 MG tablet, Take 1 tablet (15 mg total) by mouth daily., Disp: 30 tablet, Rfl: 0   Multiple Vitamin (MULTIVITAMIN) capsule, Take 1 capsule by mouth daily., Disp: , Rfl:    sertraline (ZOLOFT) 100 MG tablet, Take 100 mg by mouth daily., Disp: , Rfl:    triamcinolone cream (KENALOG) 0.1 %, Apply 1 application topically 2 (two) times daily., Disp: 30 g, Rfl: 0  EXAM:   VITALS per patient if applicable: None reported  GENERAL: alert, oriented, appears well and in no acute distress  HEENT: atraumatic, conjunttiva clear, no obvious abnormalities on inspection of external nose and ears  NECK: normal movements of the head and neck  LUNGS: on  inspection no signs of respiratory distress, breathing rate appears normal, no obvious gross increased work of breathing, gasping or wheezing  CV: no obvious cyanosis  MS: moves all visible extremities without noticeable abnormality  PSYCH/NEURO: pleasant and cooperative, no obvious  depression or anxiety, speech and thought processing grossly intact  ASSESSMENT AND PLAN:   Mild episode of recurrent major depressive disorder (HCC)  Memory loss - Plan: Ambulatory referral to Neurology  -I believe her memory loss and fatigue have a strong mental health component.  She is seeing a psychiatrist who has her on sertraline, at last visit we had discussed securing a therapist for CBT, she has not yet done this. -She is requesting a referral to neurology for her memory loss, I have placed.  Vitamin B12 and TSH levels have been normal in the past.   I discussed the assessment and treatment plan with the patient. The patient was provided an opportunity to ask questions and all were answered. The patient agreed with the plan and demonstrated an understanding of the instructions.   The patient was advised to call back or seek an in-person evaluation if the symptoms worsen or if the condition fails to improve as anticipated.    Chaya Jan, MD  [facility]

## 2022-02-26 ENCOUNTER — Ambulatory Visit: Payer: 59 | Admitting: Physical Therapy

## 2022-03-29 ENCOUNTER — Emergency Department (HOSPITAL_BASED_OUTPATIENT_CLINIC_OR_DEPARTMENT_OTHER)
Admission: EM | Admit: 2022-03-29 | Discharge: 2022-03-29 | Disposition: A | Discharge status: Home / Self Care | Payer: 59 | Attending: Emergency Medicine | Admitting: Emergency Medicine

## 2022-03-29 DIAGNOSIS — U071 COVID-19: Secondary | ICD-10-CM | POA: Insufficient documentation

## 2022-03-29 LAB — RESP PANEL BY RT-PCR (RSV, FLU A&B, COVID)  RVPGX2
Influenza A by PCR: NEGATIVE
Influenza B by PCR: NEGATIVE
Resp Syncytial Virus by PCR: NEGATIVE
SARS Coronavirus 2 by RT PCR: POSITIVE — AB

## 2022-03-29 LAB — GROUP A STREP BY PCR: Group A Strep by PCR: NOT DETECTED

## 2022-03-29 MED ORDER — ACETAMINOPHEN 325 MG PO TABS
650.0000 mg | ORAL_TABLET | Freq: Once | ORAL | Status: AC
  Administered 2022-03-29: 650 mg via ORAL
  Filled 2022-03-29: qty 2

## 2022-03-29 NOTE — Discharge Instructions (Signed)
Continue over-the-counter therapies for symptoms.  You can do Tylenol or ibuprofen as needed for the throat and ear pain.

## 2022-03-29 NOTE — ED Triage Notes (Signed)
Pt here from home with c/o sore throat ear pain and a nosebleed earlier in the week

## 2022-03-30 NOTE — ED Provider Notes (Signed)
[facility] EMERGENCY DEPT Provider Note   CSN: [id number] Arrival date & time: 03/29/22  1905     History  No chief complaint on file.   Natalie Leblanc is a 31 y.o. female.  Patient presenting today with a 4-day history of URI symptoms.  She reports this started with throat pain and ear pain then developed into a cough, headache and general malaise.  She denies any shortness of breath.  No nausea vomiting or abdominal pain.  The history is provided by the patient.       Home Medications Prior to Admission medications   Medication Sig Start Date End Date Taking? Authorizing Provider  hydrOXYzine (ATARAX) 25 MG tablet Take 25 mg by mouth at bedtime as needed.    [provider]  ibuprofen (ADVIL) 600 MG tablet Take 1 tablet (600 mg total) by mouth 3 (three) times daily. 01/14/22   Richardean Sale, DO  meloxicam (MOBIC) 15 MG tablet Take 1 tablet (15 mg total) by mouth daily. 12/24/21   Richardean Sale, DO  Multiple Vitamin (MULTIVITAMIN) capsule Take 1 capsule by mouth daily.    [provider]  sertraline (ZOLOFT) 100 MG tablet Take 100 mg by mouth daily.    [provider]  triamcinolone cream (KENALOG) 0.1 % Apply 1 application topically 2 (two) times daily. 02/12/21   Wallis Bamberg, PA-C      Allergies    Fish allergy, Mobic [meloxicam], and Other    Review of Systems   Review of Systems  Physical Exam Updated Vital Signs BP 110/80   Pulse (!) 104   Temp (!) 100.4 F (38 C) (Oral)   Resp 18   SpO2 98%  Physical Exam Vitals and nursing note reviewed.  Constitutional:      General: She is not in acute distress.    Appearance: She is well-developed.  HENT:     Head: Normocephalic and atraumatic.     Right Ear: Tympanic membrane normal.     Left Ear: Tympanic membrane normal.     Nose: Congestion present.     Mouth/Throat:     Pharynx: Posterior oropharyngeal erythema present. No oropharyngeal exudate.  Eyes:      Pupils: Pupils are equal, round, and reactive to light.  Cardiovascular:     Rate and Rhythm: Normal rate and regular rhythm.     Heart sounds: Normal heart sounds. No murmur heard.    No friction rub.  Pulmonary:     Effort: Pulmonary effort is normal.     Breath sounds: Normal breath sounds. No wheezing or rales.  Abdominal:     General: Bowel sounds are normal. There is no distension.     Palpations: Abdomen is soft.     Tenderness: There is no abdominal tenderness. There is no guarding or rebound.  Musculoskeletal:        General: No tenderness. Normal range of motion.     Comments: No edema  Lymphadenopathy:     Cervical: Cervical adenopathy present.  Skin:    General: Skin is warm and dry.     Findings: No rash.  Neurological:     Mental Status: She is alert and oriented to person, place, and time.     Cranial Nerves: No cranial nerve deficit.  Psychiatric:        Behavior: Behavior normal.     ED Results / Procedures / Treatments   Labs (all labs ordered are listed, but only abnormal results are displayed) Labs Reviewed  RESP PANEL BY RT-PCR (RSV, FLU A&B, COVID)  [id number] - Abnormal; Notable for the following components:      Result Value   SARS Coronavirus 2 by RT PCR POSITIVE (*)    All other components within normal limits  GROUP A STREP BY PCR    EKG None  Radiology No results found.  Procedures Procedures    Medications Ordered in ED Medications  acetaminophen (TYLENOL) tablet 650 mg (650 mg Oral Given 03/29/22 2240)    ED Course/ Medical Decision Making/ A&P                           Medical Decision Making Risk OTC drugs.   Pt with symptoms consistent with viral URI.  Well appearing here.  No signs of breathing difficulty  No signs of pharyngitis, otitis or abnormal abdominal findings.   strep wnl and  COVID positive.  pt to return with any further problems.         Final Clinical Impression(s) / ED Diagnoses Final diagnoses:   COVID    Rx / DC Orders ED Discharge Orders     None         Gwyneth Sprout, MD 03/30/22 0009

## 2022-04-01 ENCOUNTER — Telehealth: Payer: Self-pay | Admitting: *Deleted

## 2022-04-01 NOTE — Telephone Encounter (Signed)
Transition Care Management Follow-up Telephone Call Date of discharge and from where: 03/29/2022 [facility] How have you been since you were released from the hospital? Is not feeling any better she has covid Any questions or concerns? No  Items Reviewed: Did the pt receive and understand the discharge instructions provided? Yes  Medications obtained and verified? Yes  Other? No  Any new allergies since your discharge? No  Dietary orders reviewed? Yes Do you have support at home? Yes    Functional Questionnaire: (I = Independent and D = Dependent) ADLs: i  Bathing/Dressing- i  Meal Prep- i  Eating- i  Maintaining continence- i  Transferring/Ambulation- i  Managing Meds- i  Follow up appointments reviewed:  PCP Hospital f/u appt confirmed? Yes  Is calling today to set up an appt Are transportation arrangements needed? No  If their condition worsens, is the pt aware to call PCP or go to the Emergency Dept.? Yes Was the patient provided with contact information for the PCP's office or ED? Yes Was to pt encouraged to call back with questions or concerns? Yes

## 2022-04-11 NOTE — Progress Notes (Signed)
Natalie Leblanc D.[facility] [address] [place] [address] Phone: [phone]   Assessment and Plan:     1. Right elbow pain 2. 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  Symptoms still most consistent with medial epicondylitis - Based on relative improvement, I do not feel it is necessary to proceed with advanced imaging at this time.  If pain returns, could consider MRI of elbow - Continue HEP - May discontinue brace use at this time  Pertinent previous records reviewed include none   Follow Up: As needed if no improvement or worsening of symptoms.  Could consider ECSWT, advanced imaging, CSI, or physical therapy if symptoms return   Subjective:   I, Natalie Leblanc, am serving as a Neurosurgeon for Doctor Richardean Sale   Chief Complaint: right elbow pain    HPI:    12/24/21 Patient is a 32 year old female complaining of right elbow pain . Patient states  she suffered an injury at work about a year ago.  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  She is wondering what she can do. She states that the task of lifting the boxes undid all of her healing . Looks for new restrictions .  Went to work place occupational therapy for 3 weeks.    No numbness or tingling in the elbow , feels like she can hear her elbow grinding, no meds for the pain, is not able to lift things with out pain, one of the meds she takes makes her drowsy, hurts her when she extends her arm      01/14/2022 Patient states that its a little better but if we take the restrictions off she thinks the pain will come back    02/10/2022 Patient states that its probably a little better  but she  keeps hitting it on things  04/14/2022 Patient states elbow is the same, only hurts when she lifts heavy , taking out the trash    Relevant Historical Information: None pertinent  Additional pertinent review of systems negative.   Current Outpatient Medications:    hydrOXYzine (ATARAX) 25 MG tablet, Take 25 mg by mouth at bedtime as needed., Disp: , Rfl:    ibuprofen (ADVIL) 600 MG tablet, Take 1 tablet (600 mg total) by mouth 3 (three) times daily., Disp: 60 tablet, Rfl: 0   Multiple Vitamin (MULTIVITAMIN) capsule, Take 1 capsule by mouth daily., Disp: , Rfl:    sertraline (ZOLOFT) 100 MG tablet, Take 100 mg by mouth daily., Disp: , Rfl:    meloxicam (MOBIC) 15 MG tablet, Take 1 tablet (15 mg total) by mouth daily., Disp: 30 tablet, Rfl: 0   triamcinolone cream (KENALOG) 0.1 %, Apply 1 application topically 2 (two) times daily., Disp: 30 g, Rfl: 0   Objective:     Vitals:   04/14/22 1317  Pulse: 89  SpO2: 99%  Weight: 98 lb (44.5 kg)  Height: 4\' 11"  (1.499 m)      Body mass index is 19.79 kg/m.    Physical Exam:    General: Appears well, no acute distress, nontoxic and pleasant Neck: FROM, no pain Neuro: sensation is intact distally with no deficits, strenghth is  5/5 in elbow flexors/extenders/supinator/pronators and wrist flexors/extensors Psych: no evidence of anxiety or depression  Right ELBOW: no deformity, swelling or muscle wasting Normal Carrying angle ROM:0-140, supination and pronation 90 TTP mildly medial epicondyles NTTP over triceps, ticeps tendon, olecronon, lat epicondyle,  antecubital fossa, biceps tendon, supinator, pronator Negative tinnels over cubital tunnel No pain with resisted wrist and middle digit extension No pain with resisted wrist flexion Trace pain with resisted supination No pain with resisted pronation Negative valgus stress Negative varus stress Negative milking maneuver    Electronically signed by:  Natalie Leblanc D.Natalie Leblanc  Sports Medicine 1:26 PM 04/14/22

## 2022-04-14 ENCOUNTER — Ambulatory Visit (INDEPENDENT_AMBULATORY_CARE_PROVIDER_SITE_OTHER): Payer: 59 | Admitting: Sports Medicine

## 2022-04-14 VITALS — HR 89 | Ht 59.0 in | Wt 98.0 lb

## 2022-04-14 DIAGNOSIS — M25521 Pain in right elbow: Secondary | ICD-10-CM

## 2022-04-14 DIAGNOSIS — M7701 Medial epicondylitis, right elbow: Secondary | ICD-10-CM | POA: Diagnosis not present

## 2022-04-14 NOTE — Patient Instructions (Signed)
Good to see you   

## 2022-05-01 IMAGING — DX DG FOOT COMPLETE 3+V*L*
3 series · 3 of 3 positions shown · non-contrast
Comparison: None.

CLINICAL DATA: 28-year-old female with a history of foot pain

EXAM:
LEFT FOOT - COMPLETE 3+ VIEW

[foot ap]
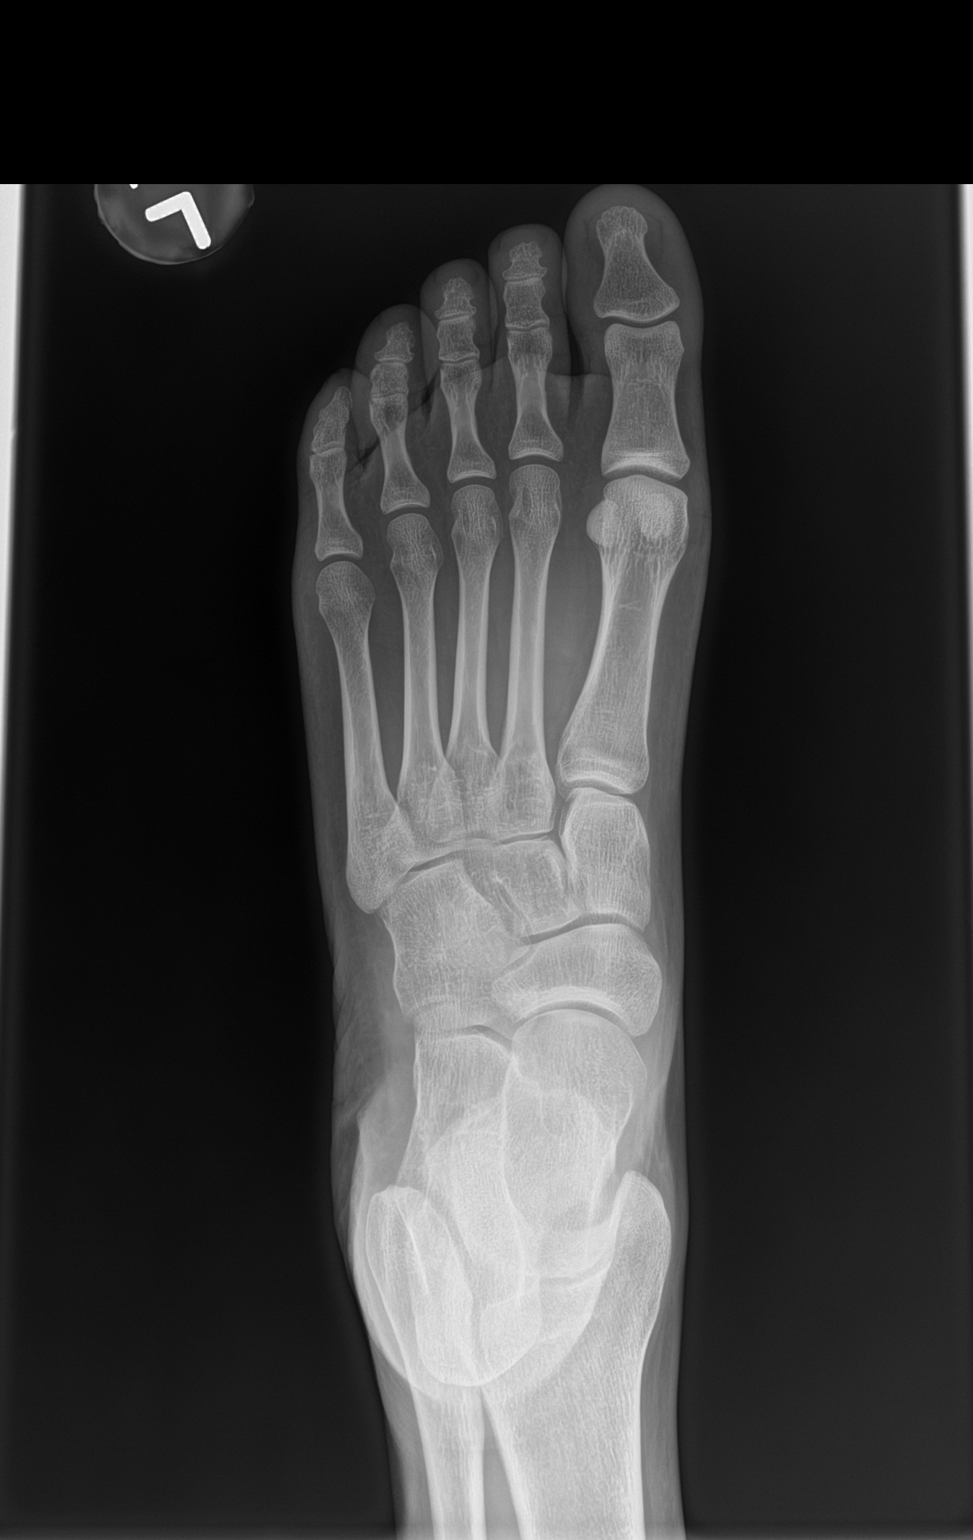

[foot obl]
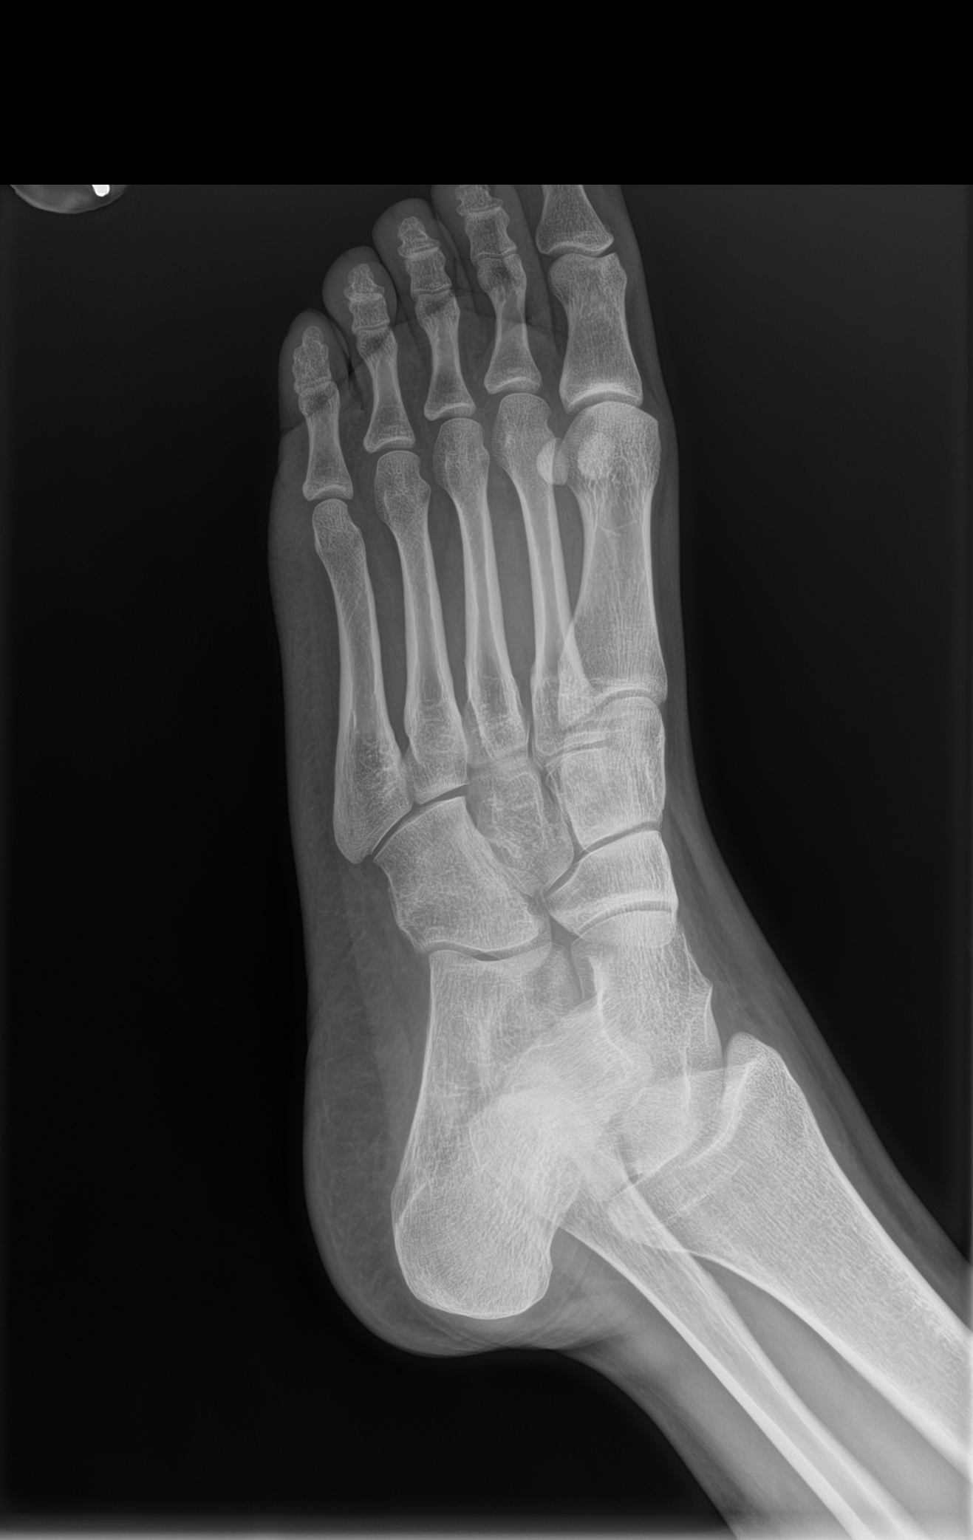

[foot lat]
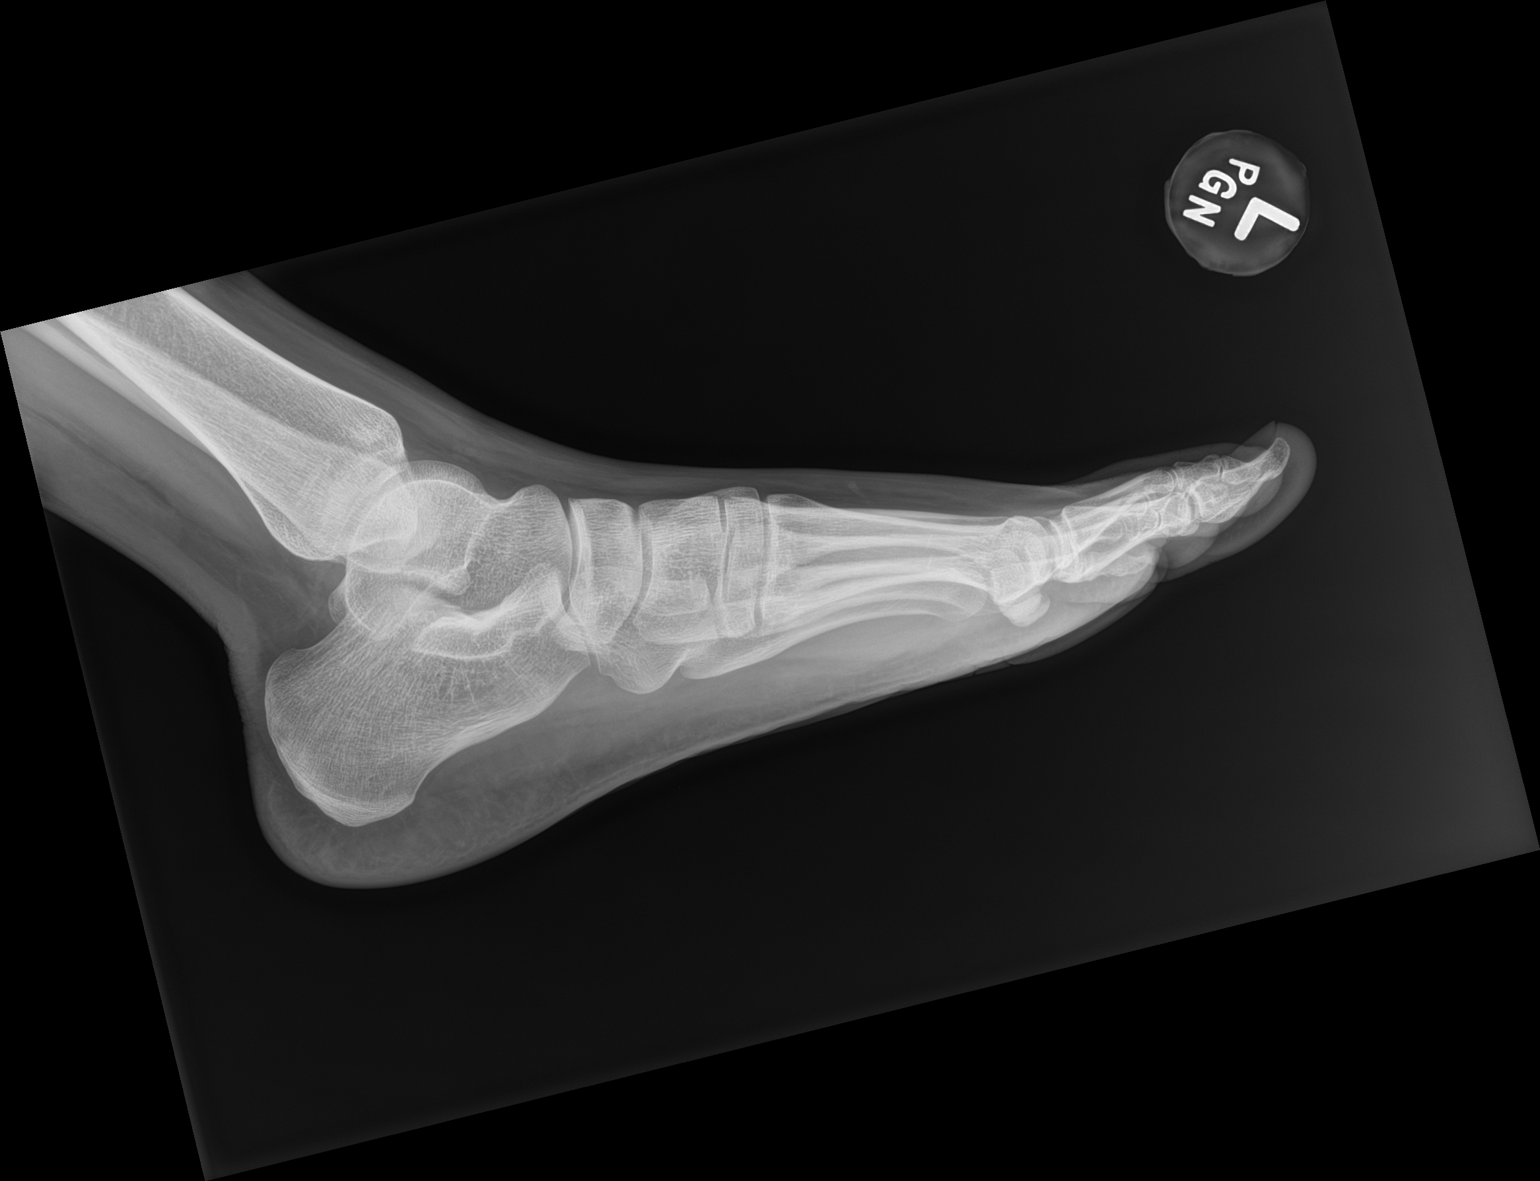

[3 of 3 positions shown; findings below may reference images not displayed]

FINDINGS: There is no evidence of fracture or dislocation. There is no
evidence of arthropathy or other focal bone abnormality. Soft
tissues are unremarkable.
IMPRESSION: Negative.

## 2022-05-06 ENCOUNTER — Encounter: Payer: Self-pay | Admitting: Psychiatry

## 2022-05-06 ENCOUNTER — Ambulatory Visit (INDEPENDENT_AMBULATORY_CARE_PROVIDER_SITE_OTHER): Payer: 59 | Admitting: Psychiatry

## 2022-05-06 VITALS — BP 101/68 | HR 95 | Ht 59.0 in | Wt 97.5 lb

## 2022-05-06 DIAGNOSIS — R413 Other amnesia: Secondary | ICD-10-CM

## 2022-05-06 DIAGNOSIS — H539 Unspecified visual disturbance: Secondary | ICD-10-CM | POA: Diagnosis not present

## 2022-05-06 DIAGNOSIS — R42 Dizziness and giddiness: Secondary | ICD-10-CM | POA: Diagnosis not present

## 2022-05-06 MED ORDER — RIZATRIPTAN BENZOATE 10 MG PO TABS: 10.0000 mg | ORAL_TABLET | ORAL | 6 refills | Status: DC | PRN

## 2022-05-06 NOTE — Patient Instructions (Addendum)
Plan:  Brain MRI  Start rizatriptan as needed for migraines. Take at the onset of migraine. If headache recurs or does not fully resolve, you may take a second dose after 2 hours. This medication may cause drowsiness  Please follow up with eye doctor to check your vision

## 2022-05-06 NOTE — Progress Notes (Signed)
[facility]  PATIENT: Natalie Leblanc DOB: 12-Dec-1990  REFERRING CLINICIAN: Isaac Bliss, Estel* HISTORY FROM: self REASON FOR VISIT: memory loss, dizziness   HISTORICAL  CHIEF COMPLAINT:  Chief Complaint  Patient presents with   Room 10    Pt is here Alone. Pt states that her dizziness is more often now that when she was younger. Pt states the memory loss is new and severe.     HISTORY OF PRESENT ILLNESS:  The patient presents for evaluation of memory loss and dizziness. States she has had dizziness since childhood but it has gotten worse over the past few months. She has been bumping into walls more frequently. Describes dizziness as a sensation of the "world being tilted" or spinning. This lasts for a few seconds at a time. Full on spinning sensation is less frequent, but she has a tilting sensation every day. This is triggered with moving from sitting to standing. She denies lightheadedness. Feels her depth perception is off and her vision is getting more blurry. States she will look at her arm and not be able to tell how far away it is. Sometimes stationary objects will look like they are moving toward her or away from her. She does report a history of migraines with associated photophobia, but is unsure if her recent vision changes have been associated with headaches.  States she has always been forgetful, but this has been worse over the past few months. Will set something down and immediately forget where she set it. Recently left the stove on, took a nap, and forgot about it. She has forgotten to take her medications and will sometimes place her pills down and forget where she left them.  She was recently diagnosed with COVID at the end of December. She is unsure if her symptoms worsened after this or not. TSH and B12 were unremarkable when last checked 6 months ago.  TBI: No known past history of TBI. Notes she fell out of the bed a lot as a  baby Stroke: no past history of stroke Seizures: no past history of seizures Sleep: Takes hydroxyzine for sleep, otherwise will only sleep a few hours at a time Mood: She reports significant stress at this time. Takes sertraline for her mood which helps somewhat  Functional status:  Patient lives alone Cooking: forgot the stove was on one time Cleaning: misplaces objects Driving: sometimes does not recognize a street if she drives on it from a different direction than what she is used to Bills: no issues Medications: will occasionally forget if she took her medication or not, has misplaced her pills previously Ever left the stove on by accident?: yes Getting lost going to familiar places?: no Forgetting loved ones names?: no Word finding difficulty? yes  OTHER MEDICAL CONDITIONS: depression, PTSD   REVIEW OF SYSTEMS: Full 14 system review of systems performed and negative with exception of: memory loss, dizziness  ALLERGIES: Allergies  Allergen Reactions   Fish Allergy     GI upset   Mobic [Meloxicam]    Other Itching    Animal Dander    HOME MEDICATIONS: Outpatient Medications Prior to Visit  Medication Sig Dispense Refill   hydrOXYzine (ATARAX) 25 MG tablet Take 25 mg by mouth at bedtime as needed.     Multiple Vitamin (MULTIVITAMIN) capsule Take 1 capsule by mouth daily.     sertraline (ZOLOFT) 100 MG tablet Take 100 mg by mouth daily.     ibuprofen (ADVIL) 600 MG tablet  Take 1 tablet (600 mg total) by mouth 3 (three) times daily. (Patient not taking: Reported on 05/06/2022) 60 tablet 0   meloxicam (MOBIC) 15 MG tablet Take 1 tablet (15 mg total) by mouth daily. 30 tablet 0   triamcinolone cream (KENALOG) 0.1 % Apply 1 application topically 2 (two) times daily. 30 g 0   No facility-administered medications prior to visit.    PAST MEDICAL HISTORY: Past Medical History:  Diagnosis Date   COVID    Depression    PTSD (post-traumatic stress disorder)     PAST  SURGICAL HISTORY: Past Surgical History:  Procedure Laterality Date   BREAST LUMPECTOMY Left    benign per patient    FAMILY HISTORY: Family History  Problem Relation Age of Onset   Gestational diabetes Mother    Alcohol abuse Mother    Mental illness Mother    Eczema Sister    Depression Sister    Psychosis Maternal Aunt    Thyroid disease Sister    Depression Sister    Heart murmur Sister     SOCIAL HISTORY: Social History   Socioeconomic History   Marital status: Single    Spouse name: Not on file   Number of children: Not on file   Years of education: Not on file   Highest education level: 12th grade  Occupational History   Not on file  Tobacco Use   Smoking status: Never   Smokeless tobacco: Never  Vaping Use   Vaping Use: Never used  Substance and Sexual Activity   Alcohol use: Yes    Comment: occasional   Drug use: No   Sexual activity: Yes  Other Topics Concern   Not on file  Social History Narrative   Not on file   Social Determinants of Health   Financial Resource Strain: Low Risk  (07/30/2021)   Overall Financial Resource Strain (CARDIA)    Difficulty of Paying Living Expenses: Not very hard  Food Insecurity: No Food Insecurity (07/30/2021)   Hunger Vital Sign    Worried About Running Out of Food in the Last Year: Never true    Ran Out of Food in the Last Year: Never true  Transportation Needs: No Transportation Needs (07/30/2021)   PRAPARE - Administrator, Civil Service (Medical): No    Lack of Transportation (Non-Medical): No  Physical Activity: Unknown (07/30/2021)   Exercise Vital Sign    Days of Exercise per Week: Patient refused    Minutes of Exercise per Session: Not on file  Stress: Stress Concern Present (07/30/2021)   [employer] of Occupational Health - Occupational Stress Questionnaire    Feeling of Stress : Very much  Social Connections: Socially Isolated (07/30/2021)   Social Connection and Isolation Panel  [NHANES]    Frequency of Communication with Friends and Family: Twice a week    Frequency of Social Gatherings with Friends and Family: Once a week    Attends Religious Services: Never    Database administrator or Organizations: No    Attends Engineer, structural: Not on file    Marital Status: Never married  Intimate Partner Violence: Not on file     PHYSICAL EXAM  GENERAL EXAM/CONSTITUTIONAL: Vitals:  Vitals:   05/06/22 1351  BP: 101/68  Pulse: 95  Weight: 97 lb 8 oz (44.2 kg)  Height: 4\' 11"  (1.499 m)   Body mass index is 19.69 kg/m. Wt Readings from Last 3 Encounters:  05/06/22 97 lb 8 oz (  44.2 kg)  04/14/22 98 lb (44.5 kg)  02/25/22 98 lb (44.5 kg)     NEUROLOGIC: MENTAL STATUS:     05/06/2022    2:18 PM  Montreal Cognitive Assessment   Visuospatial/ Executive (0/5) 5  Naming (0/3) 3  Attention: Read list of digits (0/2) 2  Attention: Read list of letters (0/1) 1  Attention: Serial 7 subtraction starting at 100 (0/3) 3  Language: Repeat phrase (0/2) 2  Language : Fluency (0/1) 1  Abstraction (0/2) 2  Delayed Recall (0/5) 4  Orientation (0/6) 6  Total 29  Adjusted Score (based on education) 29  Flat affect  CRANIAL NERVE:  2nd, 3rd, 4th, 6th - pupils equal and reactive to light, visual fields full to confrontation, extraocular muscles intact, no nystagmus 5th - facial sensation symmetric 7th - facial strength symmetric 8th - hearing intact 9th - palate elevates symmetrically, uvula midline 11th - shoulder shrug symmetric 12th - tongue protrusion midline  MOTOR:  normal bulk and tone, right arm flexion/extension deferred due to arm in cast, otherwise full strength in the BUE, BLE  SENSORY:  normal and symmetric to light touch all 4 extremities  COORDINATION:  finger-nose-finger intact bilaterally  REFLEXES:  deep tendon reflexes present and symmetric  GAIT/STATION:  normal  Dix-Hallpike negative   DIAGNOSTIC DATA (LABS, IMAGING,  TESTING) - I reviewed patient records, labs, notes, testing and imaging myself where available.  Lab Results  Component Value Date   WBC 5.7 10/15/2021   HGB 12.3 10/15/2021   HCT 38.3 10/15/2021   MCV 85.2 10/15/2021   PLT 270.0 10/15/2021      Component Value Date/Time   NA 137 10/15/2021 1140   K 3.9 10/15/2021 1140   CL 100 10/15/2021 1140   CO2 29 10/15/2021 1140   GLUCOSE 83 10/15/2021 1140   BUN 14 10/15/2021 1140   CREATININE 0.89 10/15/2021 1140   CALCIUM 9.7 10/15/2021 1140   PROT 7.5 10/15/2021 1140   ALBUMIN 4.6 10/15/2021 1140   AST 18 10/15/2021 1140   ALT 10 10/15/2021 1140   ALKPHOS 65 10/15/2021 1140   BILITOT 0.5 10/15/2021 1140   GFRNONAA >60 05/06/2020 1612   No results found for: "CHOL", "HDL", "LDLCALC", "LDLDIRECT", "TRIG", "CHOLHDL" No results found for: "HGBA1C" Lab Results  Component Value Date   VITAMINB12 1,174 (H) 10/15/2021   Lab Results  Component Value Date   TSH 3.97 10/15/2021    ASSESSMENT AND PLAN  32 y.o. year old female with a history of depression, PTSD who presents for evaluation of vertigo, vision changes, and memory loss. MOCA score today is 29/30, which is within normal limits. Marye Round is negative. Brain MRI ordered to rule out structural causes of her symptoms. Recommended she also schedule an eye exam to assess for ocular causes of increased blurred vision and issues with her depth perception.  If testing is normal, it's possible her visual distortions (perceptions of objects moving toward her and away from her) may represent [place] syndrome, a variant of migraine aura. Will start Maxalt as needed for her migraines.    1. Memory loss       PLAN: - MRI brain - Start Maxalt 10 mg PRN for migraine rescue - Recommend she schedule an eye exam for her vision changes  Orders Placed This Encounter  Procedures   MR BRAIN W WO CONTRAST    Meds ordered this encounter  Medications   rizatriptan (MAXALT)  10 MG tablet  Sig: Take 1 tablet (10 mg total) by mouth as needed for migraine. May repeat in 2 hours if needed    Dispense:  10 tablet    Refill:  6    Return in about 6 months (around 11/04/2022).  I spent an average of 37 minutes chart reviewing and counseling the patient, with at least 50% of the time face to face with the patient.   Genia Harold, MD 05/06/22 2:32 PM  [facility] [address], [place], [place] [address] [phone]

## 2022-05-15 ENCOUNTER — Telehealth: Payer: Self-pay | Admitting: Psychiatry

## 2022-05-15 NOTE — Telephone Encounter (Signed)
[facility] [place] Case Number: [id number], [employer] medicaid auth: [id number] exp. 05/14/22-07/13/22 sent to [phone]

## 2022-05-22 ENCOUNTER — Ambulatory Visit: Payer: 59 | Admitting: Internal Medicine

## 2022-05-22 ENCOUNTER — Telehealth: Payer: Self-pay | Admitting: Internal Medicine

## 2022-05-22 ENCOUNTER — Ambulatory Visit (INDEPENDENT_AMBULATORY_CARE_PROVIDER_SITE_OTHER): Payer: 59 | Admitting: Sports Medicine

## 2022-05-22 VITALS — BP 110/80 | HR 72 | Ht 59.0 in | Wt 99.0 lb

## 2022-05-22 DIAGNOSIS — M25521 Pain in right elbow: Secondary | ICD-10-CM

## 2022-05-22 DIAGNOSIS — M7701 Medial epicondylitis, right elbow: Secondary | ICD-10-CM

## 2022-05-22 NOTE — Telephone Encounter (Signed)
Patient is aware 

## 2022-05-22 NOTE — Patient Instructions (Addendum)
Good to see you MRI referral  Work note provided  Follow up 3 days after MRI to discuss results

## 2022-05-22 NOTE — Progress Notes (Signed)
Benito Mccreedy D.[place] Phone: [phone]   Assessment and Plan:    1. Right elbow pain 2. 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  Work note written for 2 weeks or until reevaluated. - Recommend continued use of wrist brace to decrease tension in right elbow - Patient verbalized frustration with her work and with Worker's Compensation at today's visit.  She stated that despite work notes from myself as well as from her Neurosurgeon, she is still being told that she has to work without accommodation.  I discussed with patient that we will continue to provide documentation and our recommendations.  I discussed that we do not complete FMLA paperwork at our office or do Worker's Compensation at our office.  I gave patient the option of following up with her Worker's Compensation physician to discuss further options and potentially ordering MRI, however patient states that she would prefer to order MRI with [place] because she does not know what this physician will want her to do at that visit.  Pertinent previous records reviewed include right elbow x-ray 12/24/2021   Follow Up: 3 days after MRI to review results and discuss treatment plan   Subjective:   I, [name], am serving as a Education administrator for Doctor Glennon Mac     Chief Complaint: right  elbow pain    HPI:    12/24/21 Patient is a 32 year old female complaining of right elbow pain . Patient states  she suffered an injury at work about a year ago.  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  She is wondering what she can do. She states that the task of lifting the boxes undid all of her healing . Looks for new restrictions .  Went to work place occupational therapy for 3 weeks.    No numbness or tingling in the elbow , feels like she can hear her elbow grinding, no meds for the pain, is not able to lift things with out pain, one of the meds she takes makes her drowsy, hurts her when she extends her arm      01/14/2022 Patient states that its a little better but if we take the restrictions off she thinks the pain will come back    02/10/2022 Patient states that its probably a little better  but she keeps hitting it on things   04/14/2022 Patient states elbow is the same, only hurts when she lifts heavy , taking out the trash   05/22/2022 Patient states she needs FMLA , states she still has elbow pain     Relevant Historical Information: None pertinent  Additional pertinent review of systems negative.   Current Outpatient Medications:    hydrOXYzine (ATARAX) 25 MG tablet, Take 25 mg  by mouth at bedtime as needed., Disp: , Rfl:    Multiple Vitamin (MULTIVITAMIN) capsule, Take 1 capsule by mouth daily., Disp: , Rfl:    rizatriptan (MAXALT) 10 MG tablet, Take 1 tablet (10 mg total) by mouth as needed for migraine. May repeat in 2 hours if needed, Disp: 10 tablet, Rfl: 6   sertraline (ZOLOFT) 100 MG tablet, Take 100 mg by mouth daily., Disp: , Rfl:    Objective:     Vitals:   05/22/22 1027  BP: 110/80  Pulse: 72  SpO2: 100%  Weight: 99 lb (44.9 kg)  Height: 4' 11"$  (1.499 m)      Body mass index is 20 kg/m.    Physical Exam:    General:  Appears well, no acute distress, nontoxic and pleasant Neck: FROM, no pain Neuro: sensation is intact distally with no deficits, strenghth is 5/5 in elbow flexors/extenders/supinator/pronators and wrist flexors/extensors Psych: no evidence of anxiety or depression   Right ELBOW: no deformity, swelling or muscle wasting Normal Carrying angle ROM:0-140, supination and pronation 90 TTP medial epicondyle NTTP over triceps, ticeps tendon, olecronon, lat epicondyle,  antecubital fossa, biceps tendon, supinator, pronator Negative tinnels over cubital tunnel Medial elbow pain with resisted wrist and middle digit extension Mild pain with resisted wrist flexion Mild pain with resisted supination Moderate pain with resisted pronation Negative valgus stress Negative varus stress Negative milking maneuver     Electronically signed by:  Benito Mccreedy D.[facility] 10:47 AM 05/22/22

## 2022-05-22 NOTE — Telephone Encounter (Signed)
Pt called to inform MD that she reached out to Ortho like MD advised her to, to have him complete the FMLA papers and she was told that he cannot complete them for her either.   Ortho provided Pt with a Doctors Note, but her place of employment does not accept Doctors Notes.  Pt is asking for any kind of advise / assistance at all.   Pt states she needs these forms completed by the end of the month.   Please advise.

## 2022-06-01 ENCOUNTER — Ambulatory Visit
Admission: RE | Admit: 2022-06-01 | Discharge: 2022-06-01 | Disposition: A | Discharge status: Home / Self Care | Payer: 59 | Source: Ambulatory Visit | Attending: Psychiatry | Admitting: Psychiatry

## 2022-06-01 DIAGNOSIS — R413 Other amnesia: Secondary | ICD-10-CM

## 2022-06-01 MED ORDER — GADOPICLENOL 0.5 MMOL/ML IV SOLN
5.0000 mL | Freq: Once | INTRAVENOUS | Status: AC | PRN
  Administered 2022-06-01: 5 mL via INTRAVENOUS

## 2022-06-03 ENCOUNTER — Ambulatory Visit
Admission: RE | Admit: 2022-06-03 | Discharge: 2022-06-03 | Disposition: A | Discharge status: Home / Self Care | Payer: 59 | Source: Ambulatory Visit | Attending: Sports Medicine | Admitting: Sports Medicine

## 2022-06-03 DIAGNOSIS — M25521 Pain in right elbow: Secondary | ICD-10-CM

## 2022-06-03 NOTE — Progress Notes (Unsigned)
    Natalie Leblanc D.[place] Phone: [phone]   Assessment and Plan:     There are no diagnoses linked to this encounter.  ***   Pertinent previous records reviewed include ***   Follow Up: ***     Subjective:   I, Natalie Leblanc, am serving as a Education administrator for Doctor Glennon Mac   Chief Complaint: right elbow pain    HPI:    12/24/21 Patient is a 32 year old female complaining of right elbow pain . Patient states  she suffered an injury at work about a year ago.  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  She is wondering what she can do. She states that the task of lifting the boxes undid all of her healing . Looks for new restrictions .  Went to work place occupational therapy for 3 weeks.    No numbness or tingling in the elbow , feels like she can hear her elbow grinding, no meds for the pain, is not able to lift things with out pain, one of the meds she takes makes her drowsy, hurts her when she extends her arm    01/14/2022 Patient states that its a little better but if we take the restrictions off she thinks the pain will come back    02/10/2022 Patient states that its probably a little better  but she keeps hitting it on things   04/14/2022 Patient states elbow is the same, only hurts when she lifts heavy , taking out the trash    05/22/2022 Patient states she needs FMLA , states she still has elbow pain    06/04/2022 Patient states    Relevant Historical Information: None pertinent  Additional pertinent review of systems negative.   Current Outpatient Medications:    hydrOXYzine (ATARAX) 25 MG tablet, Take 25 mg by mouth at bedtime as needed., Disp: , Rfl:    Multiple Vitamin (MULTIVITAMIN) capsule, Take 1 capsule by mouth daily., Disp: , Rfl:    rizatriptan  (MAXALT) 10 MG tablet, Take 1 tablet (10 mg total) by mouth as needed for migraine. May repeat in 2 hours if needed, Disp: 10 tablet, Rfl: 6   sertraline (ZOLOFT) 100 MG tablet, Take 100 mg by mouth daily., Disp: , Rfl:    Objective:     There were no vitals filed for this visit.    There is no height or weight on file to calculate BMI.    Physical Exam:    ***   Electronically signed by:  Natalie Leblanc D.[facility] 4:31 PM 06/03/22

## 2022-06-04 ENCOUNTER — Ambulatory Visit (INDEPENDENT_AMBULATORY_CARE_PROVIDER_SITE_OTHER): Payer: 59 | Admitting: Sports Medicine

## 2022-06-04 VITALS — BP 118/80 | HR 78 | Ht 59.0 in | Wt 99.0 lb

## 2022-06-04 DIAGNOSIS — M25521 Pain in right elbow: Secondary | ICD-10-CM

## 2022-06-04 DIAGNOSIS — M7701 Medial epicondylitis, right elbow: Secondary | ICD-10-CM

## 2022-06-04 NOTE — Patient Instructions (Addendum)
Good to see you  Pt referral  Work note provided  Let us know if yo would like to try a steroid injection , a PRP injection , or shockwave therapy 4 week follow up

## 2022-06-17 NOTE — Therapy (Unsigned)
OUTPATIENT PHYSICAL THERAPY SHOULDER EVALUATION   Patient Name: Natalie Leblanc MRN: [id number] DOB:1990-11-18, 32 y.o., female Today's Date: 06/18/2022  END OF SESSION:  PT End of Session - 06/18/22 1656     Visit Number 1    Number of Visits 8    Date for PT Re-Evaluation 07/16/22    Authorization Type UHC    PT Start Time 1400    PT Stop Time [id number]    PT Time Calculation (min) 45 min    Activity Tolerance Patient tolerated treatment well;Patient limited by pain    Behavior During Therapy [facility] for tasks assessed/performed             Past Medical History:  Diagnosis Date   COVID    Depression    PTSD (post-traumatic stress disorder)    Past Surgical History:  Procedure Laterality Date   BREAST LUMPECTOMY Left    benign per patient   Patient Active Problem List   Diagnosis Date Noted   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 12/28/2019   Vitamin D deficiency 12/23/2019   Depression    PTSD (post-traumatic stress disorder)     PCP: Isaac Bliss, Rayford Halsted, MD   REFERRING PROVIDER: Glennon Mac, DO  REFERRING DIAG: [phone] (ICD-10-CM) - Right elbow pain M77.01 (ICD-10-CM) - Medial epicondylitis of right elbow  THERAPY DIAG: R medial epicondylitis   Rationale for Evaluation and Treatment: Rehabilitation  ONSET DATE: Sept 2023  SUBJECTIVE:                                                                                                                                                                                      SUBJECTIVE STATEMENT: R elbow pain following R elbow strain in 9/23, R dominant, symptoms unchanging over time.  Unable to resume lifting tasks and full work duties due to pain levels.  Has has limited PT sessions to date.  Notes symptom relief with high doses of anti-inflammatories  PERTINENT HISTORY: 1. Right elbow pain 2. 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  We will trial a 4-week period of out of work to see if we can allow for adequate rest and therapy - Start physical therapy and can discuss dry needling - Discussed CSI, PRP, ECSWT.  Recommend patient research these options and see if she would like to proceed with any of them - Patient has failed conservative therapy with over 5 months of activity modification, bracing, NSAID courses, HEP - Patient dropped off short-term FMLA paperwork to be filled out - Ambulatory referral to Physical Therapy    Pertinent previous records reviewed include MRI right elbow 06/03/2022   Follow Up: 4 weeks for reevaluation.  Could consider CSI versus PRP versus ECSWT based on patient's preference  PAIN:  Are you having pain? Yes: NPRS scale: 6/10 Pain location: medial R elbow Pain description: ache Aggravating factors: pronation and resisted activities Relieving factors: rest  PRECAUTIONS: None  WEIGHT BEARING RESTRICTIONS: No  FALLS:  Has patient fallen in last 6 months? No   OCCUPATION: [employer], assembly line work  PLOF: Independent  PATIENT GOALS: To manage my R elbow pain  NEXT MD VISIT: 3 weeks  OBJECTIVE:   DIAGNOSTIC FINDINGS:  CLINICAL DATA:  Elbow pain and limited range of motion   EXAM: MRI OF THE RIGHT ELBOW WITHOUT CONTRAST   TECHNIQUE: Multiplanar, multisequence MR imaging of the elbow was performed. No intravenous contrast was administered.   COMPARISON:  Radiographs 12/24/2021   FINDINGS: TENDONS   Common forearm flexor origin: Unremarkable   Common forearm  extensor origin: Unremarkable   Biceps: Unremarkable   Triceps: Unremarkable   LIGAMENTS   Medial stabilizers: Unremarkable   Lateral stabilizers:  Unremarkable   Cartilage: No chondral defect observed.   Joint: No joint effusion or free fragment identified.   Cubital tunnel: Unremarkable   Bones: No significant extra-articular osseous abnormalities identified.   IMPRESSION: 1. No specific internal derangement is identified to explain the patient's symptoms.     Electronically Signed   By: Van Clines M.D.   On: 06/04/2022 08:35  PATIENT SURVEYS:  DASH 31/55 (45% functional)  POSTURE: unremarkable  UPPER EXTREMITY ROM:   Active ROM Right eval Left eval  Shoulder flexion WNL WNL  Shoulder extension WNL WNL  Shoulder abduction WNL WNL  Shoulder adduction WNL WNL  Shoulder internal rotation WNL WNL  Shoulder external rotation WNL WNL  Elbow flexion WNL WNL  Elbow extension WNL WNL  Wrist flexion WNL WNL  Wrist extension WNL WNL  Wrist ulnar deviation WNL WNL  Wrist radial deviation WNL WNL  Wrist pronation WNL WNL  Wrist supination WNL WNL  (Blank rows = not tested)  UPPER EXTREMITY MMT:  MMT Left eval Right eval  Shoulder flexion  5  Shoulder extension  5  Shoulder abduction  5  Shoulder adduction    Shoulder internal rotation  5  Shoulder external rotation  5  Middle trapezius    Lower trapezius    Elbow flexion 5 4+  Elbow extension 5 4+  Wrist flexion 5 4+  Wrist extension 5 4+  Wrist ulnar deviation 5 4+  Wrist radial deviation 5 4+  Wrist pronation 5 4+  Wrist supination 5 4+  Grip strength (lbs) 32/36/29/25/20 30/36/27/25/22  key 15# 10#P!  tip 12# 11#P!  (Blank rows = not tested)   JOINT MOBILITY TESTING:  Unremarkable and no instability noted  PALPATION:  TTP medial epicondyle, ulnar nerve proximal to medial epicondyle   TODAY'S TREATMENT:  DATE: 06/18/22 Eval   PATIENT EDUCATION: Education details: Discussed eval findings, rehab rationale and POC and patient is in agreement  Person educated: Patient Education method: Explanation Education comprehension: verbalized understanding and needs further education  HOME EXERCISE PROGRAM: TBD  ASSESSMENT:  CLINICAL IMPRESSION: Patient is a 32 y.o. female who was seen today for physical therapy evaluation and treatment for chronic R elbow pain.  Patient shows full ROM through RUE.  Grip and pinch strength tests due not show any marked strength deficits and resemble a bell curve showing good effort.  TTP at medial epicondyle and ulnar nerve proximally but negative Tinel sign.  No redness or warmth detected.  Tp's noted in R biceps brachii and forearm flexor group.    OBJECTIVE IMPAIRMENTS: decreased activity tolerance, decreased knowledge of condition, decreased strength, increased fascial restrictions, increased muscle spasms, impaired UE functional use, and pain.   ACTIVITY LIMITATIONS: carrying, lifting, and work tasks  PERSONAL FACTORS: Age, Past/current experiences, and Profession are also affecting patient's functional outcome.   REHAB POTENTIAL: Good  CLINICAL DECISION MAKING: Evolving/moderate complexity  EVALUATION COMPLEXITY: Moderate   GOALS: Goals reviewed with patient? Yes  SHORT TERM GOALS=LONG TERM GOALS: Target date: 07/16/2022  Patient to demonstrate independence in HEP  Baseline: TBD due to time constraints Goal status: INITIAL  2.  Increase R elbow strength to 5-/5 Baseline:   L  Eval R Eval  Elbow flexion  4+  Elbow extension  4+  Wrist flexion  4+  Wrist extension  4+  Wrist ulnar deviation  4+  Wrist radial deviation  4+  Wrist pronation  4+  Wrist supination  4+   Goal status: INITIAL  3.  Decrease pain to 2/10 Baseline: 6/10 Goal status: INITIAL  4.  Decrease DASH score to  21/55 Baseline: 31/55 Goal status: INITIAL   PLAN:  PT FREQUENCY: 2x/week  PT DURATION: 4 weeks  PLANNED INTERVENTIONS: Therapeutic exercises, Therapeutic activity, Neuromuscular re-education, Balance training, Gait training, Patient/Family education, Self Care, Joint mobilization, Dry Needling, Splintting, Taping, Ionotophoresis '4mg'$ /ml Dexamethasone, Manual therapy, and Re-evaluation  PLAN FOR NEXT SESSION: HEP establishment, ROM and flexibility tasks, R wrist and elbow strengthening, aerobic work, functional activities, TPDN and e-stim as appropriate, manual techniques as tolerated.    Lanice Shirts, PT 06/18/2022, 4:57 PM

## 2022-06-18 ENCOUNTER — Ambulatory Visit: Payer: 59 | Attending: Sports Medicine

## 2022-06-18 ENCOUNTER — Other Ambulatory Visit: Payer: Self-pay

## 2022-06-18 DIAGNOSIS — M6281 Muscle weakness (generalized): Secondary | ICD-10-CM | POA: Diagnosis present

## 2022-06-18 DIAGNOSIS — M7701 Medial epicondylitis, right elbow: Secondary | ICD-10-CM | POA: Diagnosis present

## 2022-06-18 DIAGNOSIS — M25521 Pain in right elbow: Secondary | ICD-10-CM | POA: Diagnosis not present

## 2022-06-23 ENCOUNTER — Ambulatory Visit: Payer: 59

## 2022-06-23 DIAGNOSIS — M25521 Pain in right elbow: Secondary | ICD-10-CM

## 2022-06-23 DIAGNOSIS — M6281 Muscle weakness (generalized): Secondary | ICD-10-CM

## 2022-06-23 NOTE — Therapy (Signed)
OUTPATIENT PHYSICAL THERAPY TREATMENT NOTE   Patient Name: Natalie Leblanc MRN: [id number] DOB:Sep 20, 1990, 32 y.o., female Today's Date: 06/23/2022  PCP: Isaac Bliss, Rayford Halsted, MD  REFERRING PROVIDER: Glennon Mac, DO   END OF SESSION:   PT End of Session - 06/23/22 0947     Visit Number 2    Number of Visits 8    Date for PT Re-Evaluation 07/16/22    Authorization Type UHC    PT Start Time 1000    PT Stop Time 1040    PT Time Calculation (min) 40 min    Activity Tolerance Patient tolerated treatment well;Patient limited by pain    Behavior During Therapy [facility] for tasks assessed/performed             Past Medical History:  Diagnosis Date   COVID    Depression    PTSD (post-traumatic stress disorder)    Past Surgical History:  Procedure Laterality Date   BREAST LUMPECTOMY Left    benign per patient   Patient Active Problem List   Diagnosis Date Noted   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 12/28/2019   Vitamin D deficiency 12/23/2019   Depression    PTSD (post-traumatic stress disorder)     REFERRING DIAG: M25.521 (ICD-10-CM) - Right elbow pain M77.01 (ICD-10-CM) - Medial epicondylitis of right elbow   THERAPY DIAG:  Pain in right elbow  Muscle weakness  Rationale for Evaluation and Treatment Rehabilitation  PERTINENT HISTORY: None   PRECAUTIONS: None   SUBJECTIVE:                                                                                                                                                                                      SUBJECTIVE STATEMENT:  Pt presents to PT with reports of R elbow pain and discomfort.    PAIN:  Are you having pain?  Yes: NPRS scale: 6/10 Pain location: medial R elbow Pain description: ache Aggravating factors: pronation and resisted activities Relieving factors: rest   OBJECTIVE: (objective measures completed at initial evaluation unless otherwise dated)  DIAGNOSTIC  FINDINGS:  CLINICAL DATA:  Elbow pain and limited range of motion   EXAM: MRI OF THE RIGHT ELBOW WITHOUT CONTRAST   TECHNIQUE: Multiplanar, multisequence MR imaging of the elbow was performed. No intravenous contrast was administered.   COMPARISON:  Radiographs 12/24/2021   FINDINGS: TENDONS   Common forearm flexor origin: Unremarkable   Common forearm extensor origin: Unremarkable   Biceps: Unremarkable   Triceps: Unremarkable   LIGAMENTS   Medial stabilizers: Unremarkable   Lateral stabilizers:  Unremarkable   Cartilage: No chondral defect observed.   Joint: No joint  effusion or free fragment identified.   Cubital tunnel: Unremarkable   Bones: No significant extra-articular osseous abnormalities identified.   IMPRESSION: 1. No specific internal derangement is identified to explain the patient's symptoms.     Electronically Signed   By: Van Clines M.D.   On: 06/04/2022 08:35   PATIENT SURVEYS:  DASH 31/55 (45% functional)   POSTURE: unremarkable   UPPER EXTREMITY ROM:    Active ROM Right eval Left eval  Shoulder flexion WNL WNL  Shoulder extension WNL WNL  Shoulder abduction WNL WNL  Shoulder adduction WNL WNL  Shoulder internal rotation WNL WNL  Shoulder external rotation WNL WNL  Elbow flexion WNL WNL  Elbow extension WNL WNL  Wrist flexion WNL WNL  Wrist extension WNL WNL  Wrist ulnar deviation WNL WNL  Wrist radial deviation WNL WNL  Wrist pronation WNL WNL  Wrist supination WNL WNL  (Blank rows = not tested)   UPPER EXTREMITY MMT:   MMT Left eval Right eval  Shoulder flexion   5  Shoulder extension   5  Shoulder abduction   5  Shoulder adduction      Shoulder internal rotation   5  Shoulder external rotation   5  Middle trapezius      Lower trapezius      Elbow flexion 5 4+  Elbow extension 5 4+  Wrist flexion 5 4+  Wrist extension 5 4+  Wrist ulnar deviation 5 4+  Wrist radial deviation 5 4+  Wrist pronation 5  4+  Wrist supination 5 4+  Grip strength (lbs) 32/36/29/25/20 30/36/27/25/22  key 15# 10#P!  tip 12# 11#P!  (Blank rows = not tested)     JOINT MOBILITY TESTING:  Unremarkable and no instability noted   PALPATION:  TTP medial epicondyle, ulnar nerve proximal to medial epicondyle             TODAY'S TREATMENT:                                                                                                                                         [facility] Adult PT Treatment:                                                DATE: 06/23/2022 Therapeutic Exercise: UBE lvl 1.0 x 4 min while taking subjective R wrist ext stretch 2x30" Standing tricep ext 2x10 RTB Manual Therapy: Skilled palpation of trigger points during TPDN STM to R wrist flexors Trigger Point Dry Needling Treatment: Pre-treatment instruction: Patient instructed on dry needling rationale, procedures, and possible side effects including pain during treatment (achy,cramping feeling), bruising, drop of blood, lightheadedness, nausea, sweating. Patient Consent Given: Yes Education handout provided: No Muscles treated: right bicep, right wrist flexor (group), R tricep  Needle size and number: .25x95mm x 5 Electrical stimulation performed: No Parameters: N/A Treatment response/outcome: Palpable decrease in muscle tension Post-treatment instructions: Patient instructed to expect possible mild to moderate muscle soreness later today and/or tomorrow. Patient instructed in methods to reduce muscle soreness and to continue prescribed HEP. If patient was dry needled over the lung field, patient was instructed on signs and symptoms of pneumothorax and, however unlikely, to see immediate medical attention should they occur. Patient was also educated on signs and symptoms of infection and to seek medical attention should they occur. Patient verbalized understanding of these instructions and education.     PATIENT EDUCATION: Education details:  Discussed eval findings, rehab rationale and POC and patient is in agreement  Person educated: Patient Education method: Explanation Education comprehension: verbalized understanding and needs further education   HOME EXERCISE PROGRAM: Access Code: [email or web address] URL: [email or web address] Date: 06/23/2022 Prepared by: Octavio Manns  Exercises - Seated Wrist Extension Stretch  - 1 x daily - 7 x weekly - 2 reps - 30 sec hold - Standing Tricep Extensions with Resistance  - 1 x daily - 7 x weekly - 2 sets - 10 reps - red band hold   ASSESSMENT:   CLINICAL IMPRESSION: Pt was able to complete prescribed exercises and initial HEP created, noted slight discomfort. Responded fair to manual and TPDN, noting slight decrease in pain. Pt continues to benefit from skilled PT, will progress as tolerated per POC.    OBJECTIVE IMPAIRMENTS: decreased activity tolerance, decreased knowledge of condition, decreased strength, increased fascial restrictions, increased muscle spasms, impaired UE functional use, and pain.    ACTIVITY LIMITATIONS: carrying, lifting, and work tasks   PERSONAL FACTORS: Age, Past/current experiences, and Profession are also affecting patient's functional outcome.      GOALS: Goals reviewed with patient? Yes   SHORT TERM GOALS=LONG TERM GOALS: Target date: 07/16/2022   Patient to demonstrate independence in HEP  Baseline: TBD due to time constraints Goal status: MET   2.  Increase R elbow strength to 5-/5 Baseline:    L  Eval R Eval  Elbow flexion   4+  Elbow extension   4+  Wrist flexion   4+  Wrist extension   4+  Wrist ulnar deviation   4+  Wrist radial deviation   4+  Wrist pronation   4+  Wrist supination   4+    Goal status: INITIAL   3.  Decrease pain to 2/10 Baseline: 6/10 Goal status: INITIAL   4.  Decrease DASH score to 21/55 Baseline: 31/55 Goal status: INITIAL     PLAN:   PT FREQUENCY: 2x/week   PT DURATION: 4 weeks   PLANNED  INTERVENTIONS: Therapeutic exercises, Therapeutic activity, Neuromuscular re-education, Balance training, Gait training, Patient/Family education, Self Care, Joint mobilization, Dry Needling, Splintting, Taping, Ionotophoresis 4mg /ml Dexamethasone, Manual therapy, and Re-evaluation   PLAN FOR NEXT SESSION: HEP establishment, ROM and flexibility tasks, R wrist and elbow strengthening, aerobic work, functional activities, TPDN and e-stim as appropriate, manual techniques as tolerated.    Ward Chatters, PT 06/23/2022, 11:46 AM

## 2022-06-30 NOTE — Progress Notes (Unsigned)
Natalie Leblanc D.[facility] Phone: [phone]   Assessment and Plan:    1. Right elbow pain 2. 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  We will schedule 3 procedure visits once a week for the next 3 weeks and then we will follow-up with patient in 4 weeks to review benefit - We will extend FMLA paperwork until patient can complete ECSWT in 4 weeks.  Work note provided.  Patient can send [place] paperwork for [place] to complete.  Pertinent previous records reviewed include none   Follow Up: 1 week for ECSWT   Subjective:   I, Natalie Leblanc, am serving as a Education administrator for [name]   Chief Complaint: right elbow pain    HPI:    12/24/21 Patient is a 32 year old female complaining of right elbow pain . Patient states  she suffered an injury at work about a year ago.  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  She is wondering what she can do. She states that the task of lifting the boxes undid all of her healing . Looks for new restrictions .  Went to work place occupational therapy for 3 weeks.    No numbness or tingling in the elbow , feels like she can hear her elbow grinding, no meds for the pain, is not able to lift things with out pain, one of the meds she takes makes her drowsy, hurts her when she extends her arm    01/14/2022 Patient states that its a little better but if we take the restrictions off she thinks the pain will come back    02/10/2022 Patient states that its probably a little better  but she keeps hitting it on  things   04/14/2022 Patient states elbow is the same, only hurts when she lifts heavy , taking out the trash    05/22/2022 Patient states she needs FMLA , states she still has elbow pain    06/04/2022 Patient states that she still has pain    07/01/2022 Patient states that she is still touch and go , if she does chores, and or cooking  she will get a flare     Relevant Historical Information: None pertinent Additional pertinent review of systems negative.   Current Outpatient Medications:    hydrOXYzine (ATARAX) 25 MG tablet, Take 25 mg by mouth at bedtime as needed., Disp: , Rfl:    Multiple Vitamin (MULTIVITAMIN) capsule, Take 1 capsule by mouth daily., Disp: , Rfl:    rizatriptan (MAXALT) 10 MG tablet, Take 1 tablet (10 mg total) by mouth as needed for migraine. May repeat in 2 hours if needed, Disp: 10 tablet, Rfl: 6   sertraline (ZOLOFT) 100 MG tablet, Take 100 mg by mouth daily., Disp: , Rfl:    Objective:     Vitals:   07/01/22 1058  Pulse: 80  SpO2: 99%  Weight: 99 lb (44.9 kg)  Height: 4\' 11"  (1.499 m)      Body mass index is 20 kg/m.    Physical Exam:  General: Appears well, no acute distress, nontoxic and pleasant Neck: FROM, no pain Neuro: sensation is intact distally with no deficits, strenghth is 5/5 in elbow flexors/extenders/supinator/pronators and wrist flexors/extensors Psych: no evidence of anxiety or depression   Right ELBOW: no deformity, swelling or muscle wasting Normal Carrying angle ROM:0-140, supination and pronation 90 TTP medial epicondyle NTTP over triceps, ticeps tendon, olecronon, lat epicondyle,  antecubital fossa, biceps tendon, supinator, pronator Negative tinnels over cubital tunnel Medial elbow pain with resisted wrist and middle digit extension Mild pain with resisted wrist flexion Mild pain with resisted supination Moderate pain with resisted pronation Negative valgus stress Negative varus stress Negative milking maneuver      Electronically signed by:  Natalie Leblanc D.[facility] 11:46 AM 07/01/22

## 2022-07-01 ENCOUNTER — Ambulatory Visit (INDEPENDENT_AMBULATORY_CARE_PROVIDER_SITE_OTHER): Payer: 59 | Admitting: Sports Medicine

## 2022-07-01 VITALS — HR 80 | Ht 59.0 in | Wt 99.0 lb

## 2022-07-01 DIAGNOSIS — M25521 Pain in right elbow: Secondary | ICD-10-CM | POA: Diagnosis not present

## 2022-07-01 DIAGNOSIS — M7701 Medial epicondylitis, right elbow: Secondary | ICD-10-CM

## 2022-07-01 NOTE — Patient Instructions (Addendum)
Good to see you Shockwave therapy 3 week  Follow up in clinic 4 weeks to discuss treatment  Continue PT Continue bracing Work note needed please provide [place] with the paperwork

## 2022-07-02 ENCOUNTER — Encounter: Payer: Self-pay | Admitting: Physical Therapy

## 2022-07-02 ENCOUNTER — Ambulatory Visit: Payer: 59 | Admitting: Physical Therapy

## 2022-07-02 DIAGNOSIS — M25521 Pain in right elbow: Secondary | ICD-10-CM

## 2022-07-02 DIAGNOSIS — M7701 Medial epicondylitis, right elbow: Secondary | ICD-10-CM

## 2022-07-02 DIAGNOSIS — M6281 Muscle weakness (generalized): Secondary | ICD-10-CM

## 2022-07-02 NOTE — Therapy (Signed)
OUTPATIENT PHYSICAL THERAPY TREATMENT NOTE   Patient Name: Natalie Leblanc MRN: [id number] DOB:12-16-1990, 32 y.o., female Today's Date: 07/02/2022  PCP: Isaac Bliss, Rayford Halsted, MD  REFERRING PROVIDER: Glennon Mac, DO   END OF SESSION:   PT End of Session - 07/02/22 1222     Visit Number 3    Number of Visits 8    Date for PT Re-Evaluation 07/16/22    Authorization Type [employer]    PT Start Time 1222    PT Stop Time 1300    PT Time Calculation (min) 38 min    Activity Tolerance Patient tolerated treatment well;Patient limited by pain    Behavior During Therapy WFL for tasks assessed/performed             Past Medical History:  Diagnosis Date   COVID    Depression    PTSD (post-traumatic stress disorder)    Past Surgical History:  Procedure Laterality Date   BREAST LUMPECTOMY Left    benign per patient   Patient Active Problem List   Diagnosis Date Noted   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 12/28/2019   Vitamin D deficiency 12/23/2019   Depression    PTSD (post-traumatic stress disorder)     REFERRING DIAG: M25.521 (ICD-10-CM) - Right elbow pain M77.01 (ICD-10-CM) - Medial epicondylitis of right elbow   THERAPY DIAG:  Pain in right elbow  Muscle weakness  Medial epicondylitis of elbow, right  Rationale for Evaluation and Treatment Rehabilitation  PERTINENT HISTORY: None   PRECAUTIONS: None   SUBJECTIVE:                                                                                                                                                                                      SUBJECTIVE STATEMENT:  Pt reports that she is unsure if therapy is helping or not.  She feels like it is too early to tell   PAIN:  Are you having pain?  Yes: NPRS scale: 6/10 Pain location: medial R elbow Pain description: ache Aggravating factors: pronation and resisted activities Relieving factors: rest   OBJECTIVE: (objective  measures completed at initial evaluation unless otherwise dated)  DIAGNOSTIC FINDINGS:  CLINICAL DATA:  Elbow pain and limited range of motion   EXAM: MRI OF THE RIGHT ELBOW WITHOUT CONTRAST   TECHNIQUE: Multiplanar, multisequence MR imaging of the elbow was performed. No intravenous contrast was administered.   COMPARISON:  Radiographs 12/24/2021   FINDINGS: TENDONS   Common forearm flexor origin: Unremarkable   Common forearm extensor origin: Unremarkable   Biceps: Unremarkable   Triceps: Unremarkable   LIGAMENTS   Medial stabilizers: Unremarkable   Lateral  stabilizers:  Unremarkable   Cartilage: No chondral defect observed.   Joint: No joint effusion or free fragment identified.   Cubital tunnel: Unremarkable   Bones: No significant extra-articular osseous abnormalities identified.   IMPRESSION: 1. No specific internal derangement is identified to explain the patient's symptoms.     Electronically Signed   By: Van Clines M.D.   On: 06/04/2022 08:35   PATIENT SURVEYS:  DASH 31/55 (45% functional)   POSTURE: unremarkable   UPPER EXTREMITY ROM:    Active ROM Right eval Left eval  Shoulder flexion WNL WNL  Shoulder extension WNL WNL  Shoulder abduction WNL WNL  Shoulder adduction WNL WNL  Shoulder internal rotation WNL WNL  Shoulder external rotation WNL WNL  Elbow flexion WNL WNL  Elbow extension WNL WNL  Wrist flexion WNL WNL  Wrist extension WNL WNL  Wrist ulnar deviation WNL WNL  Wrist radial deviation WNL WNL  Wrist pronation WNL WNL  Wrist supination WNL WNL  (Blank rows = not tested)   UPPER EXTREMITY MMT:   MMT Left eval Right eval  Shoulder flexion   5  Shoulder extension   5  Shoulder abduction   5  Shoulder adduction      Shoulder internal rotation   5  Shoulder external rotation   5  Middle trapezius      Lower trapezius      Elbow flexion 5 4+  Elbow extension 5 4+  Wrist flexion 5 4+  Wrist extension 5 4+   Wrist ulnar deviation 5 4+  Wrist radial deviation 5 4+  Wrist pronation 5 4+  Wrist supination 5 4+  Grip strength (lbs) 32/36/29/25/20 30/36/27/25/22  key 15# 10#P!  tip 12# 11#P!  (Blank rows = not tested)     JOINT MOBILITY TESTING:  Unremarkable and no instability noted   PALPATION:  TTP medial epicondyle, ulnar nerve proximal to medial epicondyle             TODAY'S TREATMENT:       [facility] Adult PT Treatment:                                                DATE: 07/02/2022 Therapeutic Exercise: UBE lvl 1.0 x 4 min while taking subjective R wrist ext stretch 2x30" Standing tricep ext 3x10 YTB Standing shoulder ext 3x10 - RTB Standing row - GTB - 3x10 Wrist flexion - 2# slow - 2x10  Manual Therapy: Skilled palpation of trigger points during TPDN STM to R wrist flexors Trigger Point Dry Needling Treatment: Pre-treatment instruction: Patient instructed on dry needling rationale, procedures, and possible side effects including pain during treatment (achy,cramping feeling), bruising, drop of blood, lightheadedness, nausea, sweating. Patient Consent Given: Yes Education handout provided: No Muscles treated: right wrist flexor (group), R tricep  Needle size and number: .25x51mm x3 Electrical stimulation performed: No Parameters: N/A Treatment response/outcome: Palpable decrease in muscle tension Post-treatment instructions: Patient instructed to expect possible mild to moderate muscle soreness later today and/or tomorrow. Patient instructed in methods to reduce muscle soreness and to continue prescribed HEP. If patient was dry needled over the lung field, patient was instructed on signs and symptoms of pneumothorax and, however unlikely, to see immediate medical attention should they occur. Patient was also educated on signs and symptoms of infection and to seek medical attention should they occur. Patient  verbalized understanding of these instructions and education.   Heat  applied to R elbow following therapy x5 min                                                                                                                                     [facility] Adult PT Treatment:                                                DATE: 06/23/2022 Therapeutic Exercise: UBE lvl 1.0 x 4 min while taking subjective R wrist ext stretch 2x30" Standing tricep ext 2x10 RTB Manual Therapy: Skilled palpation of trigger points during TPDN STM to R wrist flexors Trigger Point Dry Needling Treatment: Pre-treatment instruction: Patient instructed on dry needling rationale, procedures, and possible side effects including pain during treatment (achy,cramping feeling), bruising, drop of blood, lightheadedness, nausea, sweating. Patient Consent Given: Yes Education handout provided: No Muscles treated: right bicep, right wrist flexor (group), R tricep  Needle size and number: .25x31mm x 5 Electrical stimulation performed: No Parameters: N/A Treatment response/outcome: Palpable decrease in muscle tension Post-treatment instructions: Patient instructed to expect possible mild to moderate muscle soreness later today and/or tomorrow. Patient instructed in methods to reduce muscle soreness and to continue prescribed HEP. If patient was dry needled over the lung field, patient was instructed on signs and symptoms of pneumothorax and, however unlikely, to see immediate medical attention should they occur. Patient was also educated on signs and symptoms of infection and to seek medical attention should they occur. Patient verbalized understanding of these instructions and education.     PATIENT EDUCATION: Education details: Discussed eval findings, rehab rationale and POC and patient is in agreement  Person educated: Patient Education method: Explanation Education comprehension: verbalized understanding and needs further education   HOME EXERCISE PROGRAM: Access Code: [email or web address] URL:  [email or web address] Date: 06/23/2022 Prepared by: Octavio Manns  Exercises - Seated Wrist Extension Stretch  - 1 x daily - 7 x weekly - 2 reps - 30 sec hold - Standing Tricep Extensions with Resistance  - 1 x daily - 7 x weekly - 2 sets - 10 reps - red band hold   ASSESSMENT:   CLINICAL IMPRESSION: Hazle tolerated session well with no adverse reaction.  Multiple twitch responses with TDN of triceps.  Pt reports moderate increase in concordant pain with therex which reduces with rest.  Discussed that some pain with exercises was normal and even desired as long at it reduces following activity.   OBJECTIVE IMPAIRMENTS: decreased activity tolerance, decreased knowledge of condition, decreased strength, increased fascial restrictions, increased muscle spasms, impaired UE functional use, and pain.    ACTIVITY LIMITATIONS: carrying, lifting, and work tasks   PERSONAL FACTORS: Age, Past/current experiences, and Profession are also affecting patient's functional outcome.  GOALS: Goals reviewed with patient? Yes   SHORT TERM GOALS=LONG TERM GOALS: Target date: 07/16/2022   Patient to demonstrate independence in HEP  Baseline: TBD due to time constraints Goal status: MET   2.  Increase R elbow strength to 5-/5 Baseline:    L  Eval R Eval  Elbow flexion   4+  Elbow extension   4+  Wrist flexion   4+  Wrist extension   4+  Wrist ulnar deviation   4+  Wrist radial deviation   4+  Wrist pronation   4+  Wrist supination   4+    Goal status: INITIAL   3.  Decrease pain to 2/10 Baseline: 6/10 Goal status: INITIAL   4.  Decrease DASH score to 21/55 Baseline: 31/55 Goal status: INITIAL     PLAN:   PT FREQUENCY: 2x/week   PT DURATION: 4 weeks   PLANNED INTERVENTIONS: Therapeutic exercises, Therapeutic activity, Neuromuscular re-education, Balance training, Gait training, Patient/Family education, Self Care, Joint mobilization, Dry Needling, Splintting,  Taping, Ionotophoresis 4mg /ml Dexamethasone, Manual therapy, and Re-evaluation   PLAN FOR NEXT SESSION: HEP establishment, ROM and flexibility tasks, R wrist and elbow strengthening, aerobic work, functional activities, TPDN and e-stim as appropriate, manual techniques as tolerated.    Mathis Dad, PT 07/02/2022, 1:22 PM

## 2022-07-07 ENCOUNTER — Ambulatory Visit (INDEPENDENT_AMBULATORY_CARE_PROVIDER_SITE_OTHER): Payer: Self-pay | Admitting: Sports Medicine

## 2022-07-07 DIAGNOSIS — M25521 Pain in right elbow: Secondary | ICD-10-CM

## 2022-07-07 DIAGNOSIS — M7701 Medial epicondylitis, right elbow: Secondary | ICD-10-CM

## 2022-07-07 NOTE — Progress Notes (Signed)
   [facility] [place] Phone: [phone]   Extracorporeal Shockwave Therapy Note    Patient is being treated today with ECSWT. Informed consent was obtained and patient tolerated procedure well.   Therapy performed by Glennon Mac  Condition treated: medial epicondylitis Treatment preset used: Epicondylitis chronic Energy used: 120 mJ Frequency used: 16 hz Number of pulses: 3000 Treatment #1 of # 3  Electronically signed by:  [facility] 10:28 AM 07/07/22

## 2022-07-08 ENCOUNTER — Ambulatory Visit: Payer: 59 | Attending: Sports Medicine

## 2022-07-08 DIAGNOSIS — M6281 Muscle weakness (generalized): Secondary | ICD-10-CM | POA: Diagnosis present

## 2022-07-08 DIAGNOSIS — M7701 Medial epicondylitis, right elbow: Secondary | ICD-10-CM | POA: Insufficient documentation

## 2022-07-08 DIAGNOSIS — M25521 Pain in right elbow: Secondary | ICD-10-CM

## 2022-07-08 NOTE — Therapy (Signed)
OUTPATIENT PHYSICAL THERAPY TREATMENT NOTE   Patient Name: Natalie Leblanc MRN: [id number] DOB:09/15/1990, 32 y.o., female Today's Date: 07/08/2022  PCP: Isaac Bliss, Rayford Halsted, MD  REFERRING PROVIDER: Glennon Mac, DO   END OF SESSION:   PT End of Session - 07/08/22 1130     Visit Number 4    Number of Visits 8    Date for PT Re-Evaluation 07/16/22    Authorization Type [employer]    PT Start Time 1130    PT Stop Time 1210    PT Time Calculation (min) 40 min    Activity Tolerance Patient tolerated treatment well;Patient limited by pain    Behavior During Therapy [facility] for tasks assessed/performed              Past Medical History:  Diagnosis Date   COVID    Depression    PTSD (post-traumatic stress disorder)    Past Surgical History:  Procedure Laterality Date   BREAST LUMPECTOMY Left    benign per patient   Patient Active Problem List   Diagnosis Date Noted   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 12/28/2019   Vitamin D deficiency 12/23/2019   Depression    PTSD (post-traumatic stress disorder)     REFERRING DIAG: M25.521 (ICD-10-CM) - Right elbow pain M77.01 (ICD-10-CM) - Medial epicondylitis of right elbow   THERAPY DIAG:  Pain in right elbow  Muscle weakness  Medial epicondylitis of elbow, right  Rationale for Evaluation and Treatment Rehabilitation  PERTINENT HISTORY: None   PRECAUTIONS: None   SUBJECTIVE:                                                                                                                                                                                      SUBJECTIVE STATEMENT:  Patient reports that she can open jars/pill bottles with more ease now. She went to shockwave therapy for the first time yesterday and that it hurt a lot.    PAIN:  Are you having pain?  Yes: NPRS scale: 0-10 (yesterday at shockwave therapy)/10 Pain location: medial R elbow Pain description: ache Aggravating factors:  pronation and resisted activities Relieving factors: rest   OBJECTIVE: (objective measures completed at initial evaluation unless otherwise dated)  DIAGNOSTIC FINDINGS:  CLINICAL DATA:  Elbow pain and limited range of motion   EXAM: MRI OF THE RIGHT ELBOW WITHOUT CONTRAST   TECHNIQUE: Multiplanar, multisequence MR imaging of the elbow was performed. No intravenous contrast was administered.   COMPARISON:  Radiographs 12/24/2021   FINDINGS: TENDONS   Common forearm flexor origin: Unremarkable   Common forearm extensor origin: Unremarkable   Biceps: Unremarkable   Triceps:  Unremarkable   LIGAMENTS   Medial stabilizers: Unremarkable   Lateral stabilizers:  Unremarkable   Cartilage: No chondral defect observed.   Joint: No joint effusion or free fragment identified.   Cubital tunnel: Unremarkable   Bones: No significant extra-articular osseous abnormalities identified.   IMPRESSION: 1. No specific internal derangement is identified to explain the patient's symptoms.     Electronically Signed   By: Van Clines M.D.   On: 06/04/2022 08:35   PATIENT SURVEYS:  DASH 31/55 (45% functional)   POSTURE: unremarkable   UPPER EXTREMITY ROM:    Active ROM Right eval Left eval  Shoulder flexion WNL WNL  Shoulder extension WNL WNL  Shoulder abduction WNL WNL  Shoulder adduction WNL WNL  Shoulder internal rotation WNL WNL  Shoulder external rotation WNL WNL  Elbow flexion WNL WNL  Elbow extension WNL WNL  Wrist flexion WNL WNL  Wrist extension WNL WNL  Wrist ulnar deviation WNL WNL  Wrist radial deviation WNL WNL  Wrist pronation WNL WNL  Wrist supination WNL WNL  (Blank rows = not tested)   UPPER EXTREMITY MMT:   MMT Left eval Right eval  Shoulder flexion   5  Shoulder extension   5  Shoulder abduction   5  Shoulder adduction      Shoulder internal rotation   5  Shoulder external rotation   5  Middle trapezius      Lower trapezius       Elbow flexion 5 4+  Elbow extension 5 4+  Wrist flexion 5 4+  Wrist extension 5 4+  Wrist ulnar deviation 5 4+  Wrist radial deviation 5 4+  Wrist pronation 5 4+  Wrist supination 5 4+  Grip strength (lbs) 32/36/29/25/20 30/36/27/25/22  key 15# 10#P!  tip 12# 11#P!  (Blank rows = not tested)     JOINT MOBILITY TESTING:  Unremarkable and no instability noted   PALPATION:  TTP medial epicondyle, ulnar nerve proximal to medial epicondyle             TODAY'S TREATMENT:      [facility] Adult PT Treatment:                                                DATE: 07/08/22 Therapeutic Exercise: UBE lvl 1.0 x 3'/3' min while taking subjective R wrist ext/flex stretch 2x30" Standing tricep ext 3x10 YTB Standing shoulder ext 3x10 - RTB Standing row - GTB - 3x10 Wrist flexion/ext - 2# slow - 2x10 each Velcro board pronation/supination key grip/handle grip x4 laps each  [facility] Adult PT Treatment:                                                DATE: 07/02/2022 Therapeutic Exercise: UBE lvl 1.0 x 4 min while taking subjective R wrist ext stretch 2x30" Standing tricep ext 3x10 YTB Standing shoulder ext 3x10 - RTB Standing row - GTB - 3x10 Wrist flexion - 2# slow - 2x10  Manual Therapy: Skilled palpation of trigger points during TPDN STM to R wrist flexors Trigger Point Dry Needling Treatment: Pre-treatment instruction: Patient instructed on dry needling rationale, procedures, and possible side effects including pain during treatment (achy,cramping feeling), bruising, drop of blood, lightheadedness,  nausea, sweating. Patient Consent Given: Yes Education handout provided: No Muscles treated: right wrist flexor (group), R tricep  Needle size and number: .25x62mm x3 Electrical stimulation performed: No Parameters: N/A Treatment response/outcome: Palpable decrease in muscle tension Post-treatment instructions: Patient instructed to expect possible mild to moderate muscle soreness later today and/or  tomorrow. Patient instructed in methods to reduce muscle soreness and to continue prescribed HEP. If patient was dry needled over the lung field, patient was instructed on signs and symptoms of pneumothorax and, however unlikely, to see immediate medical attention should they occur. Patient was also educated on signs and symptoms of infection and to seek medical attention should they occur. Patient verbalized understanding of these instructions and education.   Heat applied to R elbow following therapy x5 min                                                                                                                                     [facility] Adult PT Treatment:                                                DATE: 06/23/2022 Therapeutic Exercise: UBE lvl 1.0 x 4 min while taking subjective R wrist ext stretch 2x30" Standing tricep ext 2x10 RTB Manual Therapy: Skilled palpation of trigger points during TPDN STM to R wrist flexors Trigger Point Dry Needling Treatment: Pre-treatment instruction: Patient instructed on dry needling rationale, procedures, and possible side effects including pain during treatment (achy,cramping feeling), bruising, drop of blood, lightheadedness, nausea, sweating. Patient Consent Given: Yes Education handout provided: No Muscles treated: right bicep, right wrist flexor (group), R tricep  Needle size and number: .25x54mm x 5 Electrical stimulation performed: No Parameters: N/A Treatment response/outcome: Palpable decrease in muscle tension Post-treatment instructions: Patient instructed to expect possible mild to moderate muscle soreness later today and/or tomorrow. Patient instructed in methods to reduce muscle soreness and to continue prescribed HEP. If patient was dry needled over the lung field, patient was instructed on signs and symptoms of pneumothorax and, however unlikely, to see immediate medical attention should they occur. Patient was also educated on signs  and symptoms of infection and to seek medical attention should they occur. Patient verbalized understanding of these instructions and education.     PATIENT EDUCATION: Education details: Discussed eval findings, rehab rationale and POC and patient is in agreement  Person educated: Patient Education method: Explanation Education comprehension: verbalized understanding and needs further education   HOME EXERCISE PROGRAM: Access Code: [email or web address] URL: [email or web address] Date: 06/23/2022 Prepared by: Octavio Manns  Exercises - Seated Wrist Extension Stretch  - 1 x daily - 7 x weekly - 2 reps - 30 sec hold - Standing Tricep Extensions with Resistance  - 1 x daily - 7 x weekly - 2  sets - 10 reps - red band hold   ASSESSMENT:   CLINICAL IMPRESSION: Patient presents to PT reporting continued pain in her elbow but reports that she has noticed improvements in her function with ability to open jars and pill bottles that she couldn't before. She states she had shockwave therapy for the first time yesterday which temporarily increased her pain. Session today continued to focus on RUE strengthening. Patient continues to benefit from skilled PT services and should be progressed as able to improve functional independence.     OBJECTIVE IMPAIRMENTS: decreased activity tolerance, decreased knowledge of condition, decreased strength, increased fascial restrictions, increased muscle spasms, impaired UE functional use, and pain.    ACTIVITY LIMITATIONS: carrying, lifting, and work tasks   PERSONAL FACTORS: Age, Past/current experiences, and Profession are also affecting patient's functional outcome.      GOALS: Goals reviewed with patient? Yes   SHORT TERM GOALS=LONG TERM GOALS: Target date: 07/16/2022   Patient to demonstrate independence in HEP  Baseline: TBD due to time constraints Goal status: MET   2.  Increase R elbow strength to 5-/5 Baseline:    L  Eval R Eval  Elbow  flexion   4+  Elbow extension   4+  Wrist flexion   4+  Wrist extension   4+  Wrist ulnar deviation   4+  Wrist radial deviation   4+  Wrist pronation   4+  Wrist supination   4+    Goal status: INITIAL   3.  Decrease pain to 2/10 Baseline: 6/10 Goal status: INITIAL   4.  Decrease DASH score to 21/55 Baseline: 31/55 Goal status: INITIAL     PLAN:   PT FREQUENCY: 2x/week   PT DURATION: 4 weeks   PLANNED INTERVENTIONS: Therapeutic exercises, Therapeutic activity, Neuromuscular re-education, Balance training, Gait training, Patient/Family education, Self Care, Joint mobilization, Dry Needling, Splintting, Taping, Ionotophoresis 4mg /ml Dexamethasone, Manual therapy, and Re-evaluation   PLAN FOR NEXT SESSION: HEP establishment, ROM and flexibility tasks, R wrist and elbow strengthening, aerobic work, functional activities, TPDN and e-stim as appropriate, manual techniques as tolerated.    Margarette Canada, PTA 07/08/2022, 12:08 PM

## 2022-07-09 NOTE — Therapy (Deleted)
OUTPATIENT PHYSICAL THERAPY TREATMENT NOTE   Patient Name: Natalie Leblanc MRN: [id number] DOB:02/06/1991, 32 y.o., female Today's Date: 07/08/2022  PCP: Isaac Bliss, Rayford Halsted, MD  REFERRING PROVIDER: Glennon Mac, DO   END OF SESSION:   PT End of Session - 07/08/22 1130     Visit Number 4    Number of Visits 8    Date for PT Re-Evaluation 07/16/22    Authorization Type [employer]    PT Start Time 1130    PT Stop Time 1210    PT Time Calculation (min) 40 min    Activity Tolerance Patient tolerated treatment well;Patient limited by pain    Behavior During Therapy [facility] for tasks assessed/performed              Past Medical History:  Diagnosis Date   COVID    Depression    PTSD (post-traumatic stress disorder)    Past Surgical History:  Procedure Laterality Date   BREAST LUMPECTOMY Left    benign per patient   Patient Active Problem List   Diagnosis Date Noted   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 12/28/2019   Vitamin D deficiency 12/23/2019   Depression    PTSD (post-traumatic stress disorder)     REFERRING DIAG: M25.521 (ICD-10-CM) - Right elbow pain M77.01 (ICD-10-CM) - Medial epicondylitis of right elbow   THERAPY DIAG:  Pain in right elbow  Muscle weakness  Medial epicondylitis of elbow, right  Rationale for Evaluation and Treatment Rehabilitation  PERTINENT HISTORY: None   PRECAUTIONS: None   SUBJECTIVE:                                                                                                                                                                                      SUBJECTIVE STATEMENT:  Patient reports that she can open jars/pill bottles with more ease now. She went to shockwave therapy for the first time yesterday and that it hurt a lot.    PAIN:  Are you having pain?  Yes: NPRS scale: 0-10 (yesterday at shockwave therapy)/10 Pain location: medial R elbow Pain description: ache Aggravating factors:  pronation and resisted activities Relieving factors: rest   OBJECTIVE: (objective measures completed at initial evaluation unless otherwise dated)  DIAGNOSTIC FINDINGS:  CLINICAL DATA:  Elbow pain and limited range of motion   EXAM: MRI OF THE RIGHT ELBOW WITHOUT CONTRAST   TECHNIQUE: Multiplanar, multisequence MR imaging of the elbow was performed. No intravenous contrast was administered.   COMPARISON:  Radiographs 12/24/2021   FINDINGS: TENDONS   Common forearm flexor origin: Unremarkable   Common forearm extensor origin: Unremarkable   Biceps: Unremarkable   Triceps:  Unremarkable   LIGAMENTS   Medial stabilizers: Unremarkable   Lateral stabilizers:  Unremarkable   Cartilage: No chondral defect observed.   Joint: No joint effusion or free fragment identified.   Cubital tunnel: Unremarkable   Bones: No significant extra-articular osseous abnormalities identified.   IMPRESSION: 1. No specific internal derangement is identified to explain the patient's symptoms.     Electronically Signed   By: Van Clines M.D.   On: 06/04/2022 08:35   PATIENT SURVEYS:  DASH 31/55 (45% functional)   POSTURE: unremarkable   UPPER EXTREMITY ROM:    Active ROM Right eval Left eval  Shoulder flexion WNL WNL  Shoulder extension WNL WNL  Shoulder abduction WNL WNL  Shoulder adduction WNL WNL  Shoulder internal rotation WNL WNL  Shoulder external rotation WNL WNL  Elbow flexion WNL WNL  Elbow extension WNL WNL  Wrist flexion WNL WNL  Wrist extension WNL WNL  Wrist ulnar deviation WNL WNL  Wrist radial deviation WNL WNL  Wrist pronation WNL WNL  Wrist supination WNL WNL  (Blank rows = not tested)   UPPER EXTREMITY MMT:   MMT Left eval Right eval  Shoulder flexion   5  Shoulder extension   5  Shoulder abduction   5  Shoulder adduction      Shoulder internal rotation   5  Shoulder external rotation   5  Middle trapezius      Lower trapezius       Elbow flexion 5 4+  Elbow extension 5 4+  Wrist flexion 5 4+  Wrist extension 5 4+  Wrist ulnar deviation 5 4+  Wrist radial deviation 5 4+  Wrist pronation 5 4+  Wrist supination 5 4+  Grip strength (lbs) 32/36/29/25/20 30/36/27/25/22  key 15# 10#P!  tip 12# 11#P!  (Blank rows = not tested)     JOINT MOBILITY TESTING:  Unremarkable and no instability noted   PALPATION:  TTP medial epicondyle, ulnar nerve proximal to medial epicondyle             TODAY'S TREATMENT:      [facility] Adult PT Treatment:                                                DATE: 07/08/22 Therapeutic Exercise: UBE lvl 1.0 x 3'/3' min while taking subjective R wrist ext/flex stretch 2x30" Standing tricep ext 3x10 YTB Standing shoulder ext 3x10 - RTB Standing row - GTB - 3x10 Wrist flexion/ext - 2# slow - 2x10 each Velcro board pronation/supination key grip/handle grip x4 laps each  [facility] Adult PT Treatment:                                                DATE: 07/02/2022 Therapeutic Exercise: UBE lvl 1.0 x 4 min while taking subjective R wrist ext stretch 2x30" Standing tricep ext 3x10 YTB Standing shoulder ext 3x10 - RTB Standing row - GTB - 3x10 Wrist flexion - 2# slow - 2x10  Manual Therapy: Skilled palpation of trigger points during TPDN STM to R wrist flexors Trigger Point Dry Needling Treatment: Pre-treatment instruction: Patient instructed on dry needling rationale, procedures, and possible side effects including pain during treatment (achy,cramping feeling), bruising, drop of blood, lightheadedness,  nausea, sweating. Patient Consent Given: Yes Education handout provided: No Muscles treated: right wrist flexor (group), R tricep  Needle size and number: .25x24mm x3 Electrical stimulation performed: No Parameters: N/A Treatment response/outcome: Palpable decrease in muscle tension Post-treatment instructions: Patient instructed to expect possible mild to moderate muscle soreness later today and/or  tomorrow. Patient instructed in methods to reduce muscle soreness and to continue prescribed HEP. If patient was dry needled over the lung field, patient was instructed on signs and symptoms of pneumothorax and, however unlikely, to see immediate medical attention should they occur. Patient was also educated on signs and symptoms of infection and to seek medical attention should they occur. Patient verbalized understanding of these instructions and education.   Heat applied to R elbow following therapy x5 min                                                                                                                                     [facility] Adult PT Treatment:                                                DATE: 06/23/2022 Therapeutic Exercise: UBE lvl 1.0 x 4 min while taking subjective R wrist ext stretch 2x30" Standing tricep ext 2x10 RTB Manual Therapy: Skilled palpation of trigger points during TPDN STM to R wrist flexors Trigger Point Dry Needling Treatment: Pre-treatment instruction: Patient instructed on dry needling rationale, procedures, and possible side effects including pain during treatment (achy,cramping feeling), bruising, drop of blood, lightheadedness, nausea, sweating. Patient Consent Given: Yes Education handout provided: No Muscles treated: right bicep, right wrist flexor (group), R tricep  Needle size and number: .25x73mm x 5 Electrical stimulation performed: No Parameters: N/A Treatment response/outcome: Palpable decrease in muscle tension Post-treatment instructions: Patient instructed to expect possible mild to moderate muscle soreness later today and/or tomorrow. Patient instructed in methods to reduce muscle soreness and to continue prescribed HEP. If patient was dry needled over the lung field, patient was instructed on signs and symptoms of pneumothorax and, however unlikely, to see immediate medical attention should they occur. Patient was also educated on signs  and symptoms of infection and to seek medical attention should they occur. Patient verbalized understanding of these instructions and education.     PATIENT EDUCATION: Education details: Discussed eval findings, rehab rationale and POC and patient is in agreement  Person educated: Patient Education method: Explanation Education comprehension: verbalized understanding and needs further education   HOME EXERCISE PROGRAM: Access Code: [email or web address] URL: [email or web address] Date: 06/23/2022 Prepared by: Octavio Manns  Exercises - Seated Wrist Extension Stretch  - 1 x daily - 7 x weekly - 2 reps - 30 sec hold - Standing Tricep Extensions with Resistance  - 1 x daily - 7 x weekly - 2  sets - 10 reps - red band hold   ASSESSMENT:   CLINICAL IMPRESSION: Patient presents to PT reporting continued pain in her elbow but reports that she has noticed improvements in her function with ability to open jars and pill bottles that she couldn't before. She states she had shockwave therapy for the first time yesterday which temporarily increased her pain. Session today continued to focus on RUE strengthening. Patient continues to benefit from skilled PT services and should be progressed as able to improve functional independence.     OBJECTIVE IMPAIRMENTS: decreased activity tolerance, decreased knowledge of condition, decreased strength, increased fascial restrictions, increased muscle spasms, impaired UE functional use, and pain.    ACTIVITY LIMITATIONS: carrying, lifting, and work tasks   PERSONAL FACTORS: Age, Past/current experiences, and Profession are also affecting patient's functional outcome.      GOALS: Goals reviewed with patient? Yes   SHORT TERM GOALS=LONG TERM GOALS: Target date: 07/16/2022   Patient to demonstrate independence in HEP  Baseline: TBD due to time constraints Goal status: MET   2.  Increase R elbow strength to 5-/5 Baseline:    L  Eval R Eval  Elbow  flexion   4+  Elbow extension   4+  Wrist flexion   4+  Wrist extension   4+  Wrist ulnar deviation   4+  Wrist radial deviation   4+  Wrist pronation   4+  Wrist supination   4+    Goal status: INITIAL   3.  Decrease pain to 2/10 Baseline: 6/10 Goal status: INITIAL   4.  Decrease DASH score to 21/55 Baseline: 31/55 Goal status: INITIAL     PLAN:   PT FREQUENCY: 2x/week   PT DURATION: 4 weeks   PLANNED INTERVENTIONS: Therapeutic exercises, Therapeutic activity, Neuromuscular re-education, Balance training, Gait training, Patient/Family education, Self Care, Joint mobilization, Dry Needling, Splintting, Taping, Ionotophoresis 4mg /ml Dexamethasone, Manual therapy, and Re-evaluation   PLAN FOR NEXT SESSION: HEP establishment, ROM and flexibility tasks, R wrist and elbow strengthening, aerobic work, functional activities, TPDN and e-stim as appropriate, manual techniques as tolerated.    Margarette Canada, PTA 07/08/2022, 12:08 PM

## 2022-07-10 ENCOUNTER — Ambulatory Visit: Payer: 59

## 2022-07-10 NOTE — Therapy (Deleted)
OUTPATIENT PHYSICAL THERAPY TREATMENT NOTE   Patient Name: Natalie Leblanc MRN: [id number] DOB:07/04/1990, 32 y.o., female Today's Date: 07/08/2022  PCP: Isaac Bliss, Rayford Halsted, MD  REFERRING PROVIDER: Glennon Mac, DO   END OF SESSION:   PT End of Session - 07/08/22 1130     Visit Number 4    Number of Visits 8    Date for PT Re-Evaluation 07/16/22    Authorization Type [employer]    PT Start Time 1130    PT Stop Time 1210    PT Time Calculation (min) 40 min    Activity Tolerance Patient tolerated treatment well;Patient limited by pain    Behavior During Therapy [facility] for tasks assessed/performed              Past Medical History:  Diagnosis Date   COVID    Depression    PTSD (post-traumatic stress disorder)    Past Surgical History:  Procedure Laterality Date   BREAST LUMPECTOMY Left    benign per patient   Patient Active Problem List   Diagnosis Date Noted   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 12/28/2019   Vitamin D deficiency 12/23/2019   Depression    PTSD (post-traumatic stress disorder)     REFERRING DIAG: M25.521 (ICD-10-CM) - Right elbow pain M77.01 (ICD-10-CM) - Medial epicondylitis of right elbow   THERAPY DIAG:  Pain in right elbow  Muscle weakness  Medial epicondylitis of elbow, right  Rationale for Evaluation and Treatment Rehabilitation  PERTINENT HISTORY: None   PRECAUTIONS: None   SUBJECTIVE:                                                                                                                                                                                      SUBJECTIVE STATEMENT:  Patient reports that she can open jars/pill bottles with more ease now. She went to shockwave therapy for the first time yesterday and that it hurt a lot.    PAIN:  Are you having pain?  Yes: NPRS scale: 0-10 (yesterday at shockwave therapy)/10 Pain location: medial R elbow Pain description: ache Aggravating factors:  pronation and resisted activities Relieving factors: rest   OBJECTIVE: (objective measures completed at initial evaluation unless otherwise dated)  DIAGNOSTIC FINDINGS:  CLINICAL DATA:  Elbow pain and limited range of motion   EXAM: MRI OF THE RIGHT ELBOW WITHOUT CONTRAST   TECHNIQUE: Multiplanar, multisequence MR imaging of the elbow was performed. No intravenous contrast was administered.   COMPARISON:  Radiographs 12/24/2021   FINDINGS: TENDONS   Common forearm flexor origin: Unremarkable   Common forearm extensor origin: Unremarkable   Biceps: Unremarkable   Triceps:  Unremarkable   LIGAMENTS   Medial stabilizers: Unremarkable   Lateral stabilizers:  Unremarkable   Cartilage: No chondral defect observed.   Joint: No joint effusion or free fragment identified.   Cubital tunnel: Unremarkable   Bones: No significant extra-articular osseous abnormalities identified.   IMPRESSION: 1. No specific internal derangement is identified to explain the patient's symptoms.     Electronically Signed   By: Van Clines M.D.   On: 06/04/2022 08:35   PATIENT SURVEYS:  DASH 31/55 (45% functional)   POSTURE: unremarkable   UPPER EXTREMITY ROM:    Active ROM Right eval Left eval  Shoulder flexion WNL WNL  Shoulder extension WNL WNL  Shoulder abduction WNL WNL  Shoulder adduction WNL WNL  Shoulder internal rotation WNL WNL  Shoulder external rotation WNL WNL  Elbow flexion WNL WNL  Elbow extension WNL WNL  Wrist flexion WNL WNL  Wrist extension WNL WNL  Wrist ulnar deviation WNL WNL  Wrist radial deviation WNL WNL  Wrist pronation WNL WNL  Wrist supination WNL WNL  (Blank rows = not tested)   UPPER EXTREMITY MMT:   MMT Left eval Right eval  Shoulder flexion   5  Shoulder extension   5  Shoulder abduction   5  Shoulder adduction      Shoulder internal rotation   5  Shoulder external rotation   5  Middle trapezius      Lower trapezius       Elbow flexion 5 4+  Elbow extension 5 4+  Wrist flexion 5 4+  Wrist extension 5 4+  Wrist ulnar deviation 5 4+  Wrist radial deviation 5 4+  Wrist pronation 5 4+  Wrist supination 5 4+  Grip strength (lbs) 32/36/29/25/20 30/36/27/25/22  key 15# 10#P!  tip 12# 11#P!  (Blank rows = not tested)     JOINT MOBILITY TESTING:  Unremarkable and no instability noted   PALPATION:  TTP medial epicondyle, ulnar nerve proximal to medial epicondyle             TODAY'S TREATMENT:      [facility] Adult PT Treatment:                                                DATE: 07/08/22 Therapeutic Exercise: UBE lvl 1.0 x 3'/3' min while taking subjective R wrist ext/flex stretch 2x30" Standing tricep ext 3x10 YTB Standing shoulder ext 3x10 - RTB Standing row - GTB - 3x10 Wrist flexion/ext - 2# slow - 2x10 each Velcro board pronation/supination key grip/handle grip x4 laps each  [facility] Adult PT Treatment:                                                DATE: 07/02/2022 Therapeutic Exercise: UBE lvl 1.0 x 4 min while taking subjective R wrist ext stretch 2x30" Standing tricep ext 3x10 YTB Standing shoulder ext 3x10 - RTB Standing row - GTB - 3x10 Wrist flexion - 2# slow - 2x10  Manual Therapy: Skilled palpation of trigger points during TPDN STM to R wrist flexors Trigger Point Dry Needling Treatment: Pre-treatment instruction: Patient instructed on dry needling rationale, procedures, and possible side effects including pain during treatment (achy,cramping feeling), bruising, drop of blood, lightheadedness,  nausea, sweating. Patient Consent Given: Yes Education handout provided: No Muscles treated: right wrist flexor (group), R tricep  Needle size and number: .25x60mm x3 Electrical stimulation performed: No Parameters: N/A Treatment response/outcome: Palpable decrease in muscle tension Post-treatment instructions: Patient instructed to expect possible mild to moderate muscle soreness later today and/or  tomorrow. Patient instructed in methods to reduce muscle soreness and to continue prescribed HEP. If patient was dry needled over the lung field, patient was instructed on signs and symptoms of pneumothorax and, however unlikely, to see immediate medical attention should they occur. Patient was also educated on signs and symptoms of infection and to seek medical attention should they occur. Patient verbalized understanding of these instructions and education.   Heat applied to R elbow following therapy x5 min                                                                                                                                     [facility] Adult PT Treatment:                                                DATE: 06/23/2022 Therapeutic Exercise: UBE lvl 1.0 x 4 min while taking subjective R wrist ext stretch 2x30" Standing tricep ext 2x10 RTB Manual Therapy: Skilled palpation of trigger points during TPDN STM to R wrist flexors Trigger Point Dry Needling Treatment: Pre-treatment instruction: Patient instructed on dry needling rationale, procedures, and possible side effects including pain during treatment (achy,cramping feeling), bruising, drop of blood, lightheadedness, nausea, sweating. Patient Consent Given: Yes Education handout provided: No Muscles treated: right bicep, right wrist flexor (group), R tricep  Needle size and number: .25x16mm x 5 Electrical stimulation performed: No Parameters: N/A Treatment response/outcome: Palpable decrease in muscle tension Post-treatment instructions: Patient instructed to expect possible mild to moderate muscle soreness later today and/or tomorrow. Patient instructed in methods to reduce muscle soreness and to continue prescribed HEP. If patient was dry needled over the lung field, patient was instructed on signs and symptoms of pneumothorax and, however unlikely, to see immediate medical attention should they occur. Patient was also educated on signs  and symptoms of infection and to seek medical attention should they occur. Patient verbalized understanding of these instructions and education.     PATIENT EDUCATION: Education details: Discussed eval findings, rehab rationale and POC and patient is in agreement  Person educated: Patient Education method: Explanation Education comprehension: verbalized understanding and needs further education   HOME EXERCISE PROGRAM: Access Code: [email or web address] URL: [email or web address] Date: 06/23/2022 Prepared by: Octavio Manns  Exercises - Seated Wrist Extension Stretch  - 1 x daily - 7 x weekly - 2 reps - 30 sec hold - Standing Tricep Extensions with Resistance  - 1 x daily - 7 x weekly - 2  sets - 10 reps - red band hold   ASSESSMENT:   CLINICAL IMPRESSION: Patient presents to PT reporting continued pain in her elbow but reports that she has noticed improvements in her function with ability to open jars and pill bottles that she couldn't before. She states she had shockwave therapy for the first time yesterday which temporarily increased her pain. Session today continued to focus on RUE strengthening. Patient continues to benefit from skilled PT services and should be progressed as able to improve functional independence.     OBJECTIVE IMPAIRMENTS: decreased activity tolerance, decreased knowledge of condition, decreased strength, increased fascial restrictions, increased muscle spasms, impaired UE functional use, and pain.    ACTIVITY LIMITATIONS: carrying, lifting, and work tasks   PERSONAL FACTORS: Age, Past/current experiences, and Profession are also affecting patient's functional outcome.      GOALS: Goals reviewed with patient? Yes   SHORT TERM GOALS=LONG TERM GOALS: Target date: 07/16/2022   Patient to demonstrate independence in HEP  Baseline: TBD due to time constraints Goal status: MET   2.  Increase R elbow strength to 5-/5 Baseline:    L  Eval R Eval  Elbow  flexion   4+  Elbow extension   4+  Wrist flexion   4+  Wrist extension   4+  Wrist ulnar deviation   4+  Wrist radial deviation   4+  Wrist pronation   4+  Wrist supination   4+    Goal status: INITIAL   3.  Decrease pain to 2/10 Baseline: 6/10 Goal status: INITIAL   4.  Decrease DASH score to 21/55 Baseline: 31/55 Goal status: INITIAL     PLAN:   PT FREQUENCY: 2x/week   PT DURATION: 4 weeks   PLANNED INTERVENTIONS: Therapeutic exercises, Therapeutic activity, Neuromuscular re-education, Balance training, Gait training, Patient/Family education, Self Care, Joint mobilization, Dry Needling, Splintting, Taping, Ionotophoresis 4mg /ml Dexamethasone, Manual therapy, and Re-evaluation   PLAN FOR NEXT SESSION: HEP establishment, ROM and flexibility tasks, R wrist and elbow strengthening, aerobic work, functional activities, TPDN and e-stim as appropriate, manual techniques as tolerated.    Margarette Canada, PTA 07/08/2022, 12:08 PM

## 2022-07-14 ENCOUNTER — Ambulatory Visit (INDEPENDENT_AMBULATORY_CARE_PROVIDER_SITE_OTHER): Payer: Self-pay | Admitting: Sports Medicine

## 2022-07-14 ENCOUNTER — Ambulatory Visit: Payer: 59

## 2022-07-14 DIAGNOSIS — M7701 Medial epicondylitis, right elbow: Secondary | ICD-10-CM

## 2022-07-14 DIAGNOSIS — M25521 Pain in right elbow: Secondary | ICD-10-CM

## 2022-07-14 DIAGNOSIS — M6281 Muscle weakness (generalized): Secondary | ICD-10-CM

## 2022-07-14 NOTE — Progress Notes (Signed)
   Richardean Sale [facility] [address] [place] [address] Phone: [phone]   Extracorporeal Shockwave Therapy Note    Patient is being treated today with ECSWT. Informed consent was obtained and patient tolerated procedure well.   Therapy performed by Richardean Sale  Condition treated: medial epicondylitis Treatment preset used: Epicondylitis chronic Energy used: 120 mJ Frequency used: 16 hz Number of pulses: 3000 Treatment #2 of # 3   Electronically signed by:  [facility] 1:18 PM 07/14/22

## 2022-07-14 NOTE — Therapy (Signed)
OUTPATIENT PHYSICAL THERAPY TREATMENT NOTE   Patient Name: Natalie Leblanc MRN: [id number] DOB:1990-05-30, 32 y.o., female Today's Date: 07/14/2022  PCP: Philip Aspen, Limmie Patricia, MD  REFERRING PROVIDER: Richardean Sale, DO   END OF SESSION:   PT End of Session - 07/14/22 1130     Visit Number 5    Number of Visits 8    Date for PT Re-Evaluation 07/16/22    Authorization Type [employer]    PT Start Time 1130    PT Stop Time 1210    PT Time Calculation (min) 40 min    Activity Tolerance Patient tolerated treatment well;Patient limited by pain    Behavior During Therapy [facility] for tasks assessed/performed               Past Medical History:  Diagnosis Date   COVID    Depression    PTSD (post-traumatic stress disorder)    Past Surgical History:  Procedure Laterality Date   BREAST LUMPECTOMY Left    benign per patient   Patient Active Problem List   Diagnosis Date Noted   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 12/28/2019   Vitamin D deficiency 12/23/2019   Depression    PTSD (post-traumatic stress disorder)     REFERRING DIAG: M25.521 (ICD-10-CM) - Right elbow pain M77.01 (ICD-10-CM) - Medial epicondylitis of right elbow   THERAPY DIAG:  Pain in right elbow  Muscle weakness  Rationale for Evaluation and Treatment Rehabilitation  PERTINENT HISTORY: None   PRECAUTIONS: None   SUBJECTIVE:                                                                                                                                                                                      SUBJECTIVE STATEMENT:  Pt presents to PT with reports of continued decrease in R elbow pain. Does note continued difficulty with fine motor tasks with R hand. Has been compliant with HEP.    PAIN:  Are you having pain?  Yes: NPRS scale: 0-10 (yesterday at shockwave therapy)/10 Pain location: medial R elbow Pain description: ache Aggravating factors: pronation and resisted  activities Relieving factors: rest   OBJECTIVE: (objective measures completed at initial evaluation unless otherwise dated)  DIAGNOSTIC FINDINGS:  CLINICAL DATA:  Elbow pain and limited range of motion   EXAM: MRI OF THE RIGHT ELBOW WITHOUT CONTRAST   TECHNIQUE: Multiplanar, multisequence MR imaging of the elbow was performed. No intravenous contrast was administered.   COMPARISON:  Radiographs 12/24/2021   FINDINGS: TENDONS   Common forearm flexor origin: Unremarkable   Common forearm extensor origin: Unremarkable   Biceps: Unremarkable   Triceps: Unremarkable   LIGAMENTS  Medial stabilizers: Unremarkable   Lateral stabilizers:  Unremarkable   Cartilage: No chondral defect observed.   Joint: No joint effusion or free fragment identified.   Cubital tunnel: Unremarkable   Bones: No significant extra-articular osseous abnormalities identified.   IMPRESSION: 1. No specific internal derangement is identified to explain the patient's symptoms.     Electronically Signed   By: Gaylyn RongWalter  Liebkemann M.D.   On: 06/04/2022 08:35   PATIENT SURVEYS:  DASH 31/55 (45% functional)   POSTURE: unremarkable   UPPER EXTREMITY ROM:    Active ROM Right eval Left eval  Shoulder flexion WNL WNL  Shoulder extension WNL WNL  Shoulder abduction WNL WNL  Shoulder adduction WNL WNL  Shoulder internal rotation WNL WNL  Shoulder external rotation WNL WNL  Elbow flexion WNL WNL  Elbow extension WNL WNL  Wrist flexion WNL WNL  Wrist extension WNL WNL  Wrist ulnar deviation WNL WNL  Wrist radial deviation WNL WNL  Wrist pronation WNL WNL  Wrist supination WNL WNL  (Blank rows = not tested)   UPPER EXTREMITY MMT:   MMT Left eval Right eval  Shoulder flexion   5  Shoulder extension   5  Shoulder abduction   5  Shoulder adduction      Shoulder internal rotation   5  Shoulder external rotation   5  Middle trapezius      Lower trapezius      Elbow flexion 5 4+   Elbow extension 5 4+  Wrist flexion 5 4+  Wrist extension 5 4+  Wrist ulnar deviation 5 4+  Wrist radial deviation 5 4+  Wrist pronation 5 4+  Wrist supination 5 4+  Grip strength (lbs) 32/36/29/25/20 30/36/27/25/22  key 15# 10#P!  tip 12# 11#P!  (Blank rows = not tested)     JOINT MOBILITY TESTING:  Unremarkable and no instability noted   PALPATION:  TTP medial epicondyle, ulnar nerve proximal to medial epicondyle             TODAY'S TREATMENT:      [facility] Adult PT Treatment:                                                DATE: 07/14/2022 Therapeutic Exercise: UBE lvl 1.0 x 4 min while taking subjective R wrist ext stretch 2x30" Standing tricep ext 3x10 10# Standing shoulder ext 2x10 10# Standing row - GTB - 3x10 Wrist flexion - 2# slow - 2x10 Wrist pronation/supination 2x10 - 2# [employer] 8# R UE x [id number] Opposition to each finger for dexterity x 30" Manual Therapy: Skilled palpation of trigger points during TPDN STM to R wrist flexors Trigger Point Dry Needling Treatment: Pre-treatment instruction: Patient instructed on dry needling rationale, procedures, and possible side effects including pain during treatment (achy,cramping feeling), bruising, drop of blood, lightheadedness, nausea, sweating. Patient Consent Given: Yes Education handout provided: No Muscles treated: right wrist flexor (group), R tricep, R bicep Needle size and number: .25x6430mm x3 Electrical stimulation performed: No Parameters: N/A Treatment response/outcome: Palpable decrease in muscle tension Post-treatment instructions: Patient instructed to expect possible mild to moderate muscle soreness later today and/or tomorrow. Patient instructed in methods to reduce muscle soreness and to continue prescribed HEP. If patient was dry needled over the lung field, patient was instructed on signs and symptoms of pneumothorax and, however unlikely, to see immediate  medical attention should they occur. Patient  was also educated on signs and symptoms of infection and to seek medical attention should they occur. Patient verbalized understanding of these instructions and education.   [facility] Adult PT Treatment:                                                DATE: 07/08/22 Therapeutic Exercise: UBE lvl 1.0 x 3'/3' min while taking subjective R wrist ext/flex stretch 2x30" Standing tricep ext 3x10 YTB Standing shoulder ext 3x10 - RTB Standing row - GTB - 3x10 Wrist flexion/ext - 2# slow - 2x10 each Velcro board pronation/supination key grip/handle grip x4 laps each  [facility] Adult PT Treatment:                                                DATE: 07/02/2022 Therapeutic Exercise: UBE lvl 1.0 x 4 min while taking subjective R wrist ext stretch 2x30" Standing tricep ext 3x10 YTB Standing shoulder ext 3x10 - RTB Standing row - GTB - 3x10 Wrist flexion - 2# slow - 2x10 Manual Therapy: Skilled palpation of trigger points during TPDN STM to R wrist flexors Trigger Point Dry Needling Treatment: Pre-treatment instruction: Patient instructed on dry needling rationale, procedures, and possible side effects including pain during treatment (achy,cramping feeling), bruising, drop of blood, lightheadedness, nausea, sweating. Patient Consent Given: Yes Education handout provided: No Muscles treated: right wrist flexor (group), R tricep  Needle size and number: .25x42mm x3 Electrical stimulation performed: No Parameters: N/A Treatment response/outcome: Palpable decrease in muscle tension Post-treatment instructions: Patient instructed to expect possible mild to moderate muscle soreness later today and/or tomorrow. Patient instructed in methods to reduce muscle soreness and to continue prescribed HEP. If patient was dry needled over the lung field, patient was instructed on signs and symptoms of pneumothorax and, however unlikely, to see immediate medical attention should they occur. Patient was also educated on signs and  symptoms of infection and to seek medical attention should they occur. Patient verbalized understanding of these instructions and education.   Heat applied to R elbow following therapy x5 min                                                                                                                                     [facility] Adult PT Treatment:                                                DATE: 06/23/2022 Therapeutic Exercise: UBE lvl 1.0 x 4  min while taking subjective R wrist ext stretch 2x30" Standing tricep ext 2x10 RTB Manual Therapy: Skilled palpation of trigger points during TPDN STM to R wrist flexors Trigger Point Dry Needling Treatment: Pre-treatment instruction: Patient instructed on dry needling rationale, procedures, and possible side effects including pain during treatment (achy,cramping feeling), bruising, drop of blood, lightheadedness, nausea, sweating. Patient Consent Given: Yes Education handout provided: No Muscles treated: right bicep, right wrist flexor (group), R tricep  Needle size and number: .25x67mm x 5 Electrical stimulation performed: No Parameters: N/A Treatment response/outcome: Palpable decrease in muscle tension Post-treatment instructions: Patient instructed to expect possible mild to moderate muscle soreness later today and/or tomorrow. Patient instructed in methods to reduce muscle soreness and to continue prescribed HEP. If patient was dry needled over the lung field, patient was instructed on signs and symptoms of pneumothorax and, however unlikely, to see immediate medical attention should they occur. Patient was also educated on signs and symptoms of infection and to seek medical attention should they occur. Patient verbalized understanding of these instructions and education.     PATIENT EDUCATION: Education details: Discussed eval findings, rehab rationale and POC and patient is in agreement  Person educated: Patient Education method:  Explanation Education comprehension: verbalized understanding and needs further education   HOME EXERCISE PROGRAM: Access Code: [email or web address] URL: [email or web address] Date: 06/23/2022 Prepared by: Edwinna Areola  Exercises - Seated Wrist Extension Stretch  - 1 x daily - 7 x weekly - 2 reps - 30 sec hold - Standing Tricep Extensions with Resistance  - 1 x daily - 7 x weekly - 2 sets - 10 reps - red band hold   ASSESSMENT:   CLINICAL IMPRESSION: Pt was able to complete prescribed exercises and initial HEP created, noted slight discomfort. Responded well today to manual and TPDN, noting decrease in discomfort post session. Discussed fine motor control exercises. Pt continues to benefit from skilled PT, will progress as tolerated per POC.     OBJECTIVE IMPAIRMENTS: decreased activity tolerance, decreased knowledge of condition, decreased strength, increased fascial restrictions, increased muscle spasms, impaired UE functional use, and pain.    ACTIVITY LIMITATIONS: carrying, lifting, and work tasks   PERSONAL FACTORS: Age, Past/current experiences, and Profession are also affecting patient's functional outcome.      GOALS: Goals reviewed with patient? Yes   SHORT TERM GOALS=LONG TERM GOALS: Target date: 07/16/2022   Patient to demonstrate independence in HEP  Baseline: TBD due to time constraints Goal status: MET   2.  Increase R elbow strength to 5-/5 Baseline:    L  Eval R Eval  Elbow flexion   4+  Elbow extension   4+  Wrist flexion   4+  Wrist extension   4+  Wrist ulnar deviation   4+  Wrist radial deviation   4+  Wrist pronation   4+  Wrist supination   4+    Goal status: INITIAL   3.  Decrease pain to 2/10 Baseline: 6/10 Goal status: INITIAL   4.  Decrease DASH score to 21/55 Baseline: 31/55 Goal status: INITIAL     PLAN:   PT FREQUENCY: 2x/week   PT DURATION: 4 weeks   PLANNED INTERVENTIONS: Therapeutic exercises, Therapeutic activity,  Neuromuscular re-education, Balance training, Gait training, Patient/Family education, Self Care, Joint mobilization, Dry Needling, Splintting, Taping, Ionotophoresis 4mg /ml Dexamethasone, Manual therapy, and Re-evaluation   PLAN FOR NEXT SESSION: HEP establishment, ROM and flexibility tasks, R wrist and elbow strengthening, aerobic work, functional activities, TPDN and e-stim as  appropriate, manual techniques as tolerated.    Eloy End, PT 07/14/2022, 12:11 PM

## 2022-07-16 ENCOUNTER — Ambulatory Visit: Payer: 59

## 2022-07-16 DIAGNOSIS — M7701 Medial epicondylitis, right elbow: Secondary | ICD-10-CM

## 2022-07-16 DIAGNOSIS — M6281 Muscle weakness (generalized): Secondary | ICD-10-CM

## 2022-07-16 DIAGNOSIS — M25521 Pain in right elbow: Secondary | ICD-10-CM | POA: Diagnosis not present

## 2022-07-16 NOTE — Therapy (Signed)
OUTPATIENT PHYSICAL THERAPY TREATMENT NOTE   Patient Name: Natalie Leblanc MRN: [id number] DOB:1990/11/04, 32 y.o., female Today's Date: 07/16/2022  PCP: Philip Aspen, Limmie Patricia, MD  REFERRING PROVIDER: Richardean Sale, DO   END OF SESSION:   PT End of Session - 07/16/22 1140     Visit Number 6    Number of Visits 8    Date for PT Re-Evaluation 07/16/22    Authorization Type [employer]    PT Start Time 1140    PT Stop Time 1220    PT Time Calculation (min) 40 min    Activity Tolerance Patient tolerated treatment well;Patient limited by pain    Behavior During Therapy WFL for tasks assessed/performed             Past Medical History:  Diagnosis Date   COVID    Depression    PTSD (post-traumatic stress disorder)    Past Surgical History:  Procedure Laterality Date   BREAST LUMPECTOMY Left    benign per patient   Patient Active Problem List   Diagnosis Date Noted   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 12/28/2019   Vitamin D deficiency 12/23/2019   Depression    PTSD (post-traumatic stress disorder)     REFERRING DIAG: M25.521 (ICD-10-CM) - Right elbow pain M77.01 (ICD-10-CM) - Medial epicondylitis of right elbow   THERAPY DIAG:  Pain in right elbow  Muscle weakness  Medial epicondylitis of elbow, right  Rationale for Evaluation and Treatment Rehabilitation  PERTINENT HISTORY: None   PRECAUTIONS: None   SUBJECTIVE:                                                                                                                                                                                      SUBJECTIVE STATEMENT:  Patient reports that the shockwave therapy went better than the first time, only getting to an 8/10. She states that she had some soreness overnight, but no pain currently.    PAIN:  Are you having pain?  Yes: NPRS scale: 0-8(yesterday at shockwave therapy)/10 Pain location: medial R elbow Pain description:  ache Aggravating factors: pronation and resisted activities Relieving factors: rest   OBJECTIVE: (objective measures completed at initial evaluation unless otherwise dated)  DIAGNOSTIC FINDINGS:  CLINICAL DATA:  Elbow pain and limited range of motion   EXAM: MRI OF THE RIGHT ELBOW WITHOUT CONTRAST   TECHNIQUE: Multiplanar, multisequence MR imaging of the elbow was performed. No intravenous contrast was administered.   COMPARISON:  Radiographs 12/24/2021   FINDINGS: TENDONS   Common forearm flexor origin: Unremarkable   Common forearm extensor origin: Unremarkable   Biceps: Unremarkable   Triceps: Unremarkable  LIGAMENTS   Medial stabilizers: Unremarkable   Lateral stabilizers:  Unremarkable   Cartilage: No chondral defect observed.   Joint: No joint effusion or free fragment identified.   Cubital tunnel: Unremarkable   Bones: No significant extra-articular osseous abnormalities identified.   IMPRESSION: 1. No specific internal derangement is identified to explain the patient's symptoms.     Electronically Signed   By: Gaylyn Rong M.D.   On: 06/04/2022 08:35   PATIENT SURVEYS:  DASH 31/55 (45% functional)   POSTURE: unremarkable   UPPER EXTREMITY ROM:    Active ROM Right eval Left eval  Shoulder flexion WNL WNL  Shoulder extension WNL WNL  Shoulder abduction WNL WNL  Shoulder adduction WNL WNL  Shoulder internal rotation WNL WNL  Shoulder external rotation WNL WNL  Elbow flexion WNL WNL  Elbow extension WNL WNL  Wrist flexion WNL WNL  Wrist extension WNL WNL  Wrist ulnar deviation WNL WNL  Wrist radial deviation WNL WNL  Wrist pronation WNL WNL  Wrist supination WNL WNL  (Blank rows = not tested)   UPPER EXTREMITY MMT:   MMT Left eval Right eval  Shoulder flexion   5  Shoulder extension   5  Shoulder abduction   5  Shoulder adduction      Shoulder internal rotation   5  Shoulder external rotation   5  Middle trapezius       Lower trapezius      Elbow flexion 5 4+  Elbow extension 5 4+  Wrist flexion 5 4+  Wrist extension 5 4+  Wrist ulnar deviation 5 4+  Wrist radial deviation 5 4+  Wrist pronation 5 4+  Wrist supination 5 4+  Grip strength (lbs) 32/36/29/25/20 30/36/27/25/22  key 15# 10#P!  tip 12# 11#P!  (Blank rows = not tested)     JOINT MOBILITY TESTING:  Unremarkable and no instability noted   PALPATION:  TTP medial epicondyle, ulnar nerve proximal to medial epicondyle             TODAY'S TREATMENT:    [facility] Adult PT Treatment:                                                DATE: 07/16/22 Therapeutic Exercise: UBE lvl 1.0 x 3'/3' min while taking subjective R wrist ext stretch 2x30" R wrist flex stretch 2x30" Wrist flexion/extension therabar 2x30" Ulnar/radial deviation therabar x30" Standing tricep ext 3x10 7# Standing shoulder ext 2x10 7# Standing row - GTB - 3x10 Wrist flexion - 2# slow - 2x10 Wrist extension - 2# slow - 2x10 Wrist pronation/supination 2x10 - 2# Farmers carry 8# R UE x 341ft (fatigued 3/4 through second lap, needed to hand off weight) Opposition to each finger for dexterity x 30"     [facility] Adult PT Treatment:                                                DATE: 07/14/2022 Therapeutic Exercise: UBE lvl 1.0 x 4 min while taking subjective R wrist ext stretch 2x30" Standing tricep ext 3x10 10# Standing shoulder ext 2x10 10# Standing row - GTB - 3x10 Wrist flexion - 2# slow - 2x10 Wrist pronation/supination 2x10 - 2# Farmers carry 8#  R UE x 387ft Opposition to each finger for dexterity x 30" Manual Therapy: Skilled palpation of trigger points during TPDN STM to R wrist flexors Trigger Point Dry Needling Treatment: Pre-treatment instruction: Patient instructed on dry needling rationale, procedures, and possible side effects including pain during treatment (achy,cramping feeling), bruising, drop of blood, lightheadedness, nausea, sweating. Patient Consent Given:  Yes Education handout provided: No Muscles treated: right wrist flexor (group), R tricep, R bicep Needle size and number: .25x42mm x3 Electrical stimulation performed: No Parameters: N/A Treatment response/outcome: Palpable decrease in muscle tension Post-treatment instructions: Patient instructed to expect possible mild to moderate muscle soreness later today and/or tomorrow. Patient instructed in methods to reduce muscle soreness and to continue prescribed HEP. If patient was dry needled over the lung field, patient was instructed on signs and symptoms of pneumothorax and, however unlikely, to see immediate medical attention should they occur. Patient was also educated on signs and symptoms of infection and to seek medical attention should they occur. Patient verbalized understanding of these instructions and education.   [facility] Adult PT Treatment:                                                DATE: 07/08/22 Therapeutic Exercise: UBE lvl 1.0 x 3'/3' min while taking subjective R wrist ext/flex stretch 2x30" Standing tricep ext 3x10 YTB Standing shoulder ext 3x10 - RTB Standing row - GTB - 3x10 Wrist flexion/ext - 2# slow - 2x10 each Velcro board pronation/supination key grip/handle grip x4 laps each    PATIENT EDUCATION: Education details: Discussed eval findings, rehab rationale and POC and patient is in agreement  Person educated: Patient Education method: Explanation Education comprehension: verbalized understanding and needs further education   HOME EXERCISE PROGRAM: Access Code: [email or web address] URL: [email or web address] Date: 06/23/2022 Prepared by: Edwinna Areola  Exercises - Seated Wrist Extension Stretch  - 1 x daily - 7 x weekly - 2 reps - 30 sec hold - Standing Tricep Extensions with Resistance  - 1 x daily - 7 x weekly - 2 sets - 10 reps - red band hold   ASSESSMENT:   CLINICAL IMPRESSION: Patient presents to PT reporting no current pain and that she was able  to better tolerate shockwave therapy on Monday after TPDN. Session today focused on wrist, elbow, and periscapular strengthening. She may benefit from BFR therapy next session. Patient was able to tolerate all prescribed exercises with no adverse effects. Patient continues to benefit from skilled PT services and should be progressed as able to improve functional independence.     OBJECTIVE IMPAIRMENTS: decreased activity tolerance, decreased knowledge of condition, decreased strength, increased fascial restrictions, increased muscle spasms, impaired UE functional use, and pain.    ACTIVITY LIMITATIONS: carrying, lifting, and work tasks   PERSONAL FACTORS: Age, Past/current experiences, and Profession are also affecting patient's functional outcome.      GOALS: Goals reviewed with patient? Yes   SHORT TERM GOALS=LONG TERM GOALS: Target date: 07/16/2022   Patient to demonstrate independence in HEP  Baseline: TBD due to time constraints Goal status: MET   2.  Increase R elbow strength to 5-/5 Baseline:    L  Eval R Eval  Elbow flexion   4+  Elbow extension   4+  Wrist flexion   4+  Wrist extension   4+  Wrist ulnar deviation   4+  Wrist radial deviation   4+  Wrist pronation   4+  Wrist supination   4+    Goal status: INITIAL   3.  Decrease pain to 2/10 Baseline: 6/10 Goal status: INITIAL   4.  Decrease DASH score to 21/55 Baseline: 31/55 Goal status: INITIAL     PLAN:   PT FREQUENCY: 2x/week   PT DURATION: 4 weeks   PLANNED INTERVENTIONS: Therapeutic exercises, Therapeutic activity, Neuromuscular re-education, Balance training, Gait training, Patient/Family education, Self Care, Joint mobilization, Dry Needling, Splintting, Taping, Ionotophoresis 4mg /ml Dexamethasone, Manual therapy, and Re-evaluation   PLAN FOR NEXT SESSION: HEP establishment, ROM and flexibility tasks, R wrist and elbow strengthening, aerobic work, functional activities, TPDN and e-stim as  appropriate, manual techniques as tolerated, BFR?   Berta MinorStephanie Williams, PTA 07/16/2022, 11:40 AM

## 2022-07-21 ENCOUNTER — Ambulatory Visit: Payer: 59 | Admitting: Sports Medicine

## 2022-07-22 ENCOUNTER — Encounter: Payer: Self-pay | Admitting: Physical Therapy

## 2022-07-22 ENCOUNTER — Ambulatory Visit: Payer: 59 | Admitting: Physical Therapy

## 2022-07-22 DIAGNOSIS — M6281 Muscle weakness (generalized): Secondary | ICD-10-CM

## 2022-07-22 DIAGNOSIS — M25521 Pain in right elbow: Secondary | ICD-10-CM

## 2022-07-22 DIAGNOSIS — M7701 Medial epicondylitis, right elbow: Secondary | ICD-10-CM

## 2022-07-22 NOTE — Therapy (Signed)
OUTPATIENT PHYSICAL THERAPY TREATMENT NOTE   Patient Name: Natalie Leblanc MRN: [id number] DOB:1990/11/02, 32 y.o., female Today's Date: 07/22/2022  PCP: Philip Aspen, Limmie Patricia, MD  REFERRING PROVIDER: Richardean Sale, DO   END OF SESSION:   PT End of Session - 07/22/22 1610     Visit Number 7    Number of Visits --    Date for PT Re-Evaluation 09/16/22    Authorization Type [employer]    PT Start Time 0415    PT Stop Time 0456    PT Time Calculation (min) 41 min    Activity Tolerance Patient tolerated treatment well;Patient limited by pain    Behavior During Therapy [facility] for tasks assessed/performed             Past Medical History:  Diagnosis Date   COVID    Depression    PTSD (post-traumatic stress disorder)    Past Surgical History:  Procedure Laterality Date   BREAST LUMPECTOMY Left    benign per patient   Patient Active Problem List   Diagnosis Date Noted   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 12/28/2019   Vitamin D deficiency 12/23/2019   Depression    PTSD (post-traumatic stress disorder)     REFERRING DIAG: M25.521 (ICD-10-CM) - Right elbow pain M77.01 (ICD-10-CM) - Medial epicondylitis of right elbow   THERAPY DIAG:  Pain in right elbow - Plan: PT plan of care cert/re-cert  Muscle weakness - Plan: PT plan of care cert/re-cert  Medial epicondylitis of elbow, right - Plan: PT plan of care cert/re-cert  Rationale for Evaluation and Treatment Rehabilitation  PERTINENT HISTORY: None   PRECAUTIONS: None   SUBJECTIVE:                                                                                                                                                                                      SUBJECTIVE STATEMENT:  Pt reports that she has moderate pain this morning.  She reports that she is able to do quite a bit more than she could prior to therapy.   PAIN:  Are you having pain?  Yes: NPRS scale: 0-8(yesterday at  shockwave therapy)/10 Pain location: medial R elbow Pain description: ache Aggravating factors: pronation and resisted activities Relieving factors: rest   OBJECTIVE: (objective measures completed at initial evaluation unless otherwise dated)  DIAGNOSTIC FINDINGS:  CLINICAL DATA:  Elbow pain and limited range of motion   EXAM: MRI OF THE RIGHT ELBOW WITHOUT CONTRAST   TECHNIQUE: Multiplanar, multisequence MR imaging of the elbow was performed. No intravenous contrast was administered.   COMPARISON:  Radiographs 12/24/2021   FINDINGS: TENDONS   Common  forearm flexor origin: Unremarkable   Common forearm extensor origin: Unremarkable   Biceps: Unremarkable   Triceps: Unremarkable   LIGAMENTS   Medial stabilizers: Unremarkable   Lateral stabilizers:  Unremarkable   Cartilage: No chondral defect observed.   Joint: No joint effusion or free fragment identified.   Cubital tunnel: Unremarkable   Bones: No significant extra-articular osseous abnormalities identified.   IMPRESSION: 1. No specific internal derangement is identified to explain the patient's symptoms.     Electronically Signed   By: Gaylyn Rong M.D.   On: 06/04/2022 08:35   PATIENT SURVEYS:  DASH 31/55 (45% functional)   POSTURE: unremarkable   UPPER EXTREMITY ROM:    Active ROM Right eval Left eval  Shoulder flexion WNL WNL  Shoulder extension WNL WNL  Shoulder abduction WNL WNL  Shoulder adduction WNL WNL  Shoulder internal rotation WNL WNL  Shoulder external rotation WNL WNL  Elbow flexion WNL WNL  Elbow extension WNL WNL  Wrist flexion WNL WNL  Wrist extension WNL WNL  Wrist ulnar deviation WNL WNL  Wrist radial deviation WNL WNL  Wrist pronation WNL WNL  Wrist supination WNL WNL  (Blank rows = not tested)   UPPER EXTREMITY MMT:   MMT Left eval Right eval  Shoulder flexion   5  Shoulder extension   5  Shoulder abduction   5  Shoulder adduction      Shoulder  internal rotation   5  Shoulder external rotation   5  Middle trapezius      Lower trapezius      Elbow flexion 5 4+  Elbow extension 5 4+  Wrist flexion 5 4+  Wrist extension 5 4+  Wrist ulnar deviation 5 4+  Wrist radial deviation 5 4+  Wrist pronation 5 4+  Wrist supination 5 4+  Grip strength (lbs) 32/36/29/25/20 30/36/27/25/22  key 15# 10#P!  tip 12# 11#P!  (Blank rows = not tested)     JOINT MOBILITY TESTING:  Unremarkable and no instability noted   PALPATION:  TTP medial epicondyle, ulnar nerve proximal to medial epicondyle             TODAY'S TREATMENT:    [facility] Adult PT Treatment:                                                DATE: 07/22/22 Therapeutic Exercise: UBE lvl 1.0 x 3'/3' min while taking subjective R wrist ext stretch 2x30" R wrist flex stretch 2x30" Wrist flexion/extension therabar 2x30" Ulnar/radial deviation therabar x30" Standing tricep ext 3x10 7# Standing shoulder ext 2x10 10# Standing row - 10# - 3x10 Farmers carry 8# R UE x 180 ft  With BFR 50% occlusion  Wrist flexion - 2# slow - 2x10 Wrist extension - 2# slow - 2x10 Wrist pronation/supination 2x10 - 2#  Therapeutic Activity - collecting information for goals, checking progress, and reviewing with patient   [facility] Adult PT Treatment:                                                DATE: 07/16/22 Therapeutic Exercise: UBE lvl 1.0 x 3'/3' min while taking subjective R wrist ext stretch 2x30" R wrist flex stretch 2x30" Wrist flexion/extension therabar 2x30"  Ulnar/radial deviation therabar x30" Standing tricep ext 3x10 7# Standing shoulder ext 2x10 7# Standing row - GTB - 3x10 Wrist flexion - 2# slow - 2x10 Wrist extension - 2# slow - 2x10 Wrist pronation/supination 2x10 - 2# Farmers carry 8# R UE x 326ft (fatigued 3/4 through second lap, needed to hand off weight) Opposition to each finger for dexterity x 30"     [facility] Adult PT Treatment:                                                 DATE: 07/14/2022 Therapeutic Exercise: UBE lvl 1.0 x 4 min while taking subjective R wrist ext stretch 2x30" Standing tricep ext 3x10 10# Standing shoulder ext 2x10 10# Standing row - GTB - 3x10 Wrist flexion - 2# slow - 2x10 Wrist pronation/supination 2x10 - 2# Farmers carry 8# R UE x 368ft Opposition to each finger for dexterity x 30" Manual Therapy: Skilled palpation of trigger points during TPDN STM to R wrist flexors Trigger Point Dry Needling Treatment: Pre-treatment instruction: Patient instructed on dry needling rationale, procedures, and possible side effects including pain during treatment (achy,cramping feeling), bruising, drop of blood, lightheadedness, nausea, sweating. Patient Consent Given: Yes Education handout provided: No Muscles treated: right wrist flexor (group), R tricep, R bicep Needle size and number: .25x75mm x3 Electrical stimulation performed: No Parameters: N/A Treatment response/outcome: Palpable decrease in muscle tension Post-treatment instructions: Patient instructed to expect possible mild to moderate muscle soreness later today and/or tomorrow. Patient instructed in methods to reduce muscle soreness and to continue prescribed HEP. If patient was dry needled over the lung field, patient was instructed on signs and symptoms of pneumothorax and, however unlikely, to see immediate medical attention should they occur. Patient was also educated on signs and symptoms of infection and to seek medical attention should they occur. Patient verbalized understanding of these instructions and education.   [facility] Adult PT Treatment:                                                DATE: 07/08/22 Therapeutic Exercise: UBE lvl 1.0 x 3'/3' min while taking subjective R wrist ext/flex stretch 2x30" Standing tricep ext 3x10 YTB Standing shoulder ext 3x10 - RTB Standing row - GTB - 3x10 Wrist flexion/ext - 2# slow - 2x10 each Velcro board pronation/supination key grip/handle  grip x4 laps each    PATIENT EDUCATION: Education details: Discussed eval findings, rehab rationale and POC and patient is in agreement  Person educated: Patient Education method: Explanation Education comprehension: verbalized understanding and needs further education   HOME EXERCISE PROGRAM: Access Code: [email or web address] URL: [email or web address] Date: 06/23/2022 Prepared by: Edwinna Areola  Exercises - Seated Wrist Extension Stretch  - 1 x daily - 7 x weekly - 2 reps - 30 sec hold - Standing Tricep Extensions with Resistance  - 1 x daily - 7 x weekly - 2 sets - 10 reps - red band hold   ASSESSMENT:   CLINICAL IMPRESSION: Jaiyanna has progressed well with therapy.  Improved impairments include: wrist and elbow strength, pain.  Functional improvements include: ability to hold pot while cooking and do light activities at home.  Progressions needed include: continued progressive strengthening with integration to TDN  and BFR.  Barriers to progress include: chronic.  Marland Kitchen  Please see GOALS section for progress on short term and long term goals established at evaluation.  I recommend continuation of PT to allow completion of remaining goals and continued functional progression.    OBJECTIVE IMPAIRMENTS: decreased activity tolerance, decreased knowledge of condition, decreased strength, increased fascial restrictions, increased muscle spasms, impaired UE functional use, and pain.    ACTIVITY LIMITATIONS: carrying, lifting, and work tasks   PERSONAL FACTORS: Age, Past/current experiences, and Profession are also affecting patient's functional outcome.      GOALS: Goals reviewed with patient? Yes   SHORT TERM GOALS=LONG TERM GOALS: Target date: 07/16/2022 (extended to 09/16/2022)   Patient to demonstrate independence in HEP  Baseline: TBD due to time constraints 4/16: partially compliant Goal status: ongoing   2.  Increase R elbow strength to 5-/5 Baseline:    L  Eval R Eval  R 4/16  Elbow flexion   4+ 5-  Elbow extension   4+ 5-  Wrist flexion   4+ 5-  Wrist extension   4+ 5-  Wrist ulnar deviation   4+ 5  Wrist radial deviation   4+ 5  Wrist pronation   4+ 5  Wrist supination   4+ 5    Goal status: MET   3.  Decrease pain to 2/10 Baseline: 6/10 4/16: 4/10 Goal status: ongoing   4.  Decrease DASH score to 21/55 Baseline: 31/55 4/16: QuickDASH Score: 38.6 / 100 = 38.6 % Goal status: ongoing     PLAN:   PT FREQUENCY: 1-2x/week   PT DURATION: 8 weeks (09/16/2022)   PLANNED INTERVENTIONS: Therapeutic exercises, Therapeutic activity, Neuromuscular re-education, Balance training, Gait training, Patient/Family education, Self Care, Joint mobilization, Dry Needling, Splintting, Taping, Ionotophoresis /ml Dexamethasone, Manual therapy, and Re-evaluation   PLAN FOR NEXT SESSION: HEP establishment, ROM and flexibility tasks, R wrist and elbow strengthening, aerobic work, functional activities, TPDN and e-stim as appropriate, manual techniques as tolerated, BFR?   Fredderick Phenix, PT 07/22/2022, 4:58 PM

## 2022-07-28 ENCOUNTER — Ambulatory Visit: Payer: 59 | Admitting: Sports Medicine

## 2022-07-28 VITALS — BP 108/78 | HR 89 | Ht 59.0 in | Wt 99.0 lb

## 2022-07-28 DIAGNOSIS — M7701 Medial epicondylitis, right elbow: Secondary | ICD-10-CM | POA: Diagnosis not present

## 2022-07-28 DIAGNOSIS — M25521 Pain in right elbow: Secondary | ICD-10-CM | POA: Diagnosis not present

## 2022-07-28 NOTE — Progress Notes (Signed)
Natalie Leblanc D.[facility] [address] [place] [address] Phone: [phone]   Assessment and Plan:     1. Right elbow pain 2. 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  Patient felt that ECSWT was especially effective after having dry needling done earlier that day with physical therapy - May follow-up as needed after a dry needling treatment with physical therapy and we can perform ECSWT. - Discussed potential injection options such as needling with lidocaine, PRP, versus less preferred CSI.  Patient declines injection at this time, though could be considered at future visit - May restart work.  Recommend no lifting more than 10 pounds with right upper extremity and recommend not performing repetitive tasks with right upper extremity.  Work note provided  Pertinent previous records reviewed include none   Follow Up: As needed   Subjective:   I, [name], am serving as a Neurosurgeon for Doctor Richardean Sale       Chief Complaint: right elbow pain    HPI:    12/24/21 Patient is a 32 year old female complaining of right elbow pain . Patient states  she suffered an injury at work about a year ago.  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  She is wondering what she can do. She states that the task of lifting the boxes undid all of her healing . Looks for new restrictions .  Went to work place occupational therapy for 3 weeks.    No numbness or tingling in the elbow , feels like she can hear her elbow grinding, no meds for the pain, is not able to lift things with out pain, one of the meds she takes makes  her drowsy, hurts her when she extends her arm      01/14/2022 Patient states that its a little better but if we take the restrictions off she thinks the pain will come back    02/10/2022 Patient states that its probably a little better  but she keeps hitting it on things   04/14/2022 Patient states elbow is the same, only hurts when she lifts heavy , taking out the trash    05/22/2022 Patient states she needs FMLA , states she still has elbow pain    07/28/2022 Patient states the elbow is okay , still has pain     Relevant Historical Information: None pertinent  Additional pertinent review of systems negative.   Current Outpatient Medications:    hydrOXYzine (ATARAX) 25 MG tablet, Take 25 mg by mouth at bedtime as needed., Disp: , Rfl:    Multiple Vitamin (MULTIVITAMIN) capsule, Take 1 capsule by mouth daily., Disp: , Rfl:    rizatriptan (MAXALT) 10 MG tablet, Take 1 tablet (10 mg total) by mouth as needed for migraine. May repeat in 2 hours if needed, Disp: 10 tablet, Rfl: 6   sertraline (ZOLOFT) 100 MG tablet, Take 100 mg by mouth daily., Disp: , Rfl:    Objective:     Vitals:   07/28/22 1317  BP: 108/78  Pulse: 89  SpO2: 98%  Weight: 99 lb (44.9 kg)  Height:  (1.499 m)  Body mass index is 20 kg/m.    Physical Exam:    General: Appears well, no acute distress, nontoxic and pleasant Neck: FROM, no pain Neuro: sensation is intact distally with no deficits, strenghth is 5/5 in elbow flexors/extenders/supinator/pronators and wrist flexors/extensors Psych: no evidence of anxiety or depression   Right ELBOW: no deformity, swelling or muscle wasting Normal Carrying angle ROM:0-140, supination and pronation 90 TTP medial epicondyle and posterior right wrist NTTP over triceps, ticeps tendon, olecronon, lat epicondyle,  antecubital fossa, biceps tendon, supinator, pronator Negative tinnels over cubital tunnel Medial elbow pain with resisted wrist and middle  digit extension Mild pain with resisted wrist flexion Mild pain with resisted supination Moderate pain with resisted pronation Negative valgus stress Negative varus stress Negative milking maneuver     Electronically signed by:  Natalie Leblanc D.[facility] 1:46 PM 07/28/22

## 2022-07-28 NOTE — Patient Instructions (Addendum)
Good to see you  Restart work  Recommend no lifting more than 10 lbs with right arm  Recommend no continues repetitive motions with right arm 4 weeks  Continue PT As needed follow up ,if you would like to have shock wave done on same day as PT

## 2022-07-30 ENCOUNTER — Encounter: Payer: Self-pay | Admitting: Physical Therapy

## 2022-07-30 ENCOUNTER — Ambulatory Visit: Payer: 59 | Admitting: Physical Therapy

## 2022-07-30 DIAGNOSIS — M25521 Pain in right elbow: Secondary | ICD-10-CM | POA: Diagnosis not present

## 2022-07-30 DIAGNOSIS — M7701 Medial epicondylitis, right elbow: Secondary | ICD-10-CM

## 2022-07-30 DIAGNOSIS — M6281 Muscle weakness (generalized): Secondary | ICD-10-CM

## 2022-07-30 NOTE — Therapy (Signed)
OUTPATIENT PHYSICAL THERAPY TREATMENT NOTE   Patient Name: Natalie Leblanc MRN: [id number] DOB:04-Feb-1991, 32 y.o., female Today's Date: 07/30/2022  PCP: Philip Aspen, Limmie Patricia, MD  REFERRING PROVIDER: Richardean Sale, DO   END OF SESSION:   PT End of Session - 07/30/22 1215     Visit Number 8    Date for PT Re-Evaluation 09/16/22    Authorization Type [employer]    PT Start Time 1215    PT Stop Time 1256    PT Time Calculation (min) 41 min    Activity Tolerance Patient tolerated treatment well;Patient limited by pain    Behavior During Therapy WFL for tasks assessed/performed             Past Medical History:  Diagnosis Date   COVID    Depression    PTSD (post-traumatic stress disorder)    Past Surgical History:  Procedure Laterality Date   BREAST LUMPECTOMY Left    benign per patient   Patient Active Problem List   Diagnosis Date Noted   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 12/28/2019   Vitamin D deficiency 12/23/2019   Depression    PTSD (post-traumatic stress disorder)     REFERRING DIAG: M25.521 (ICD-10-CM) - Right elbow pain M77.01 (ICD-10-CM) - Medial epicondylitis of right elbow   THERAPY DIAG:  Pain in right elbow  Muscle weakness  Medial epicondylitis of elbow, right  Rationale for Evaluation and Treatment Rehabilitation  PERTINENT HISTORY: None   PRECAUTIONS: None   SUBJECTIVE:                                                                                                                                                                                      SUBJECTIVE STATEMENT:  Pt reports that she has not had any pain this morning.  She continues to have pain later in the day with repetitive motions or heavy lifting.   PAIN:  Are you having pain?  Yes: NPRS scale: 0-8(yesterday at shockwave therapy)/10 Pain location: medial R elbow Pain description: ache Aggravating factors: pronation and resisted  activities Relieving factors: rest   OBJECTIVE: (objective measures completed at initial evaluation unless otherwise dated)  DIAGNOSTIC FINDINGS:  CLINICAL DATA:  Elbow pain and limited range of motion   EXAM: MRI OF THE RIGHT ELBOW WITHOUT CONTRAST   TECHNIQUE: Multiplanar, multisequence MR imaging of the elbow was performed. No intravenous contrast was administered.   COMPARISON:  Radiographs 12/24/2021   FINDINGS: TENDONS   Common forearm flexor origin: Unremarkable   Common forearm extensor origin: Unremarkable   Biceps: Unremarkable   Triceps: Unremarkable   LIGAMENTS   Medial stabilizers: Unremarkable  Lateral stabilizers:  Unremarkable   Cartilage: No chondral defect observed.   Joint: No joint effusion or free fragment identified.   Cubital tunnel: Unremarkable   Bones: No significant extra-articular osseous abnormalities identified.   IMPRESSION: 1. No specific internal derangement is identified to explain the patient's symptoms.     Electronically Signed   By: Gaylyn Rong M.D.   On: 06/04/2022 08:35   PATIENT SURVEYS:  DASH 31/55 (45% functional)   POSTURE: unremarkable   UPPER EXTREMITY ROM:    Active ROM Right eval Left eval  Shoulder flexion WNL WNL  Shoulder extension WNL WNL  Shoulder abduction WNL WNL  Shoulder adduction WNL WNL  Shoulder internal rotation WNL WNL  Shoulder external rotation WNL WNL  Elbow flexion WNL WNL  Elbow extension WNL WNL  Wrist flexion WNL WNL  Wrist extension WNL WNL  Wrist ulnar deviation WNL WNL  Wrist radial deviation WNL WNL  Wrist pronation WNL WNL  Wrist supination WNL WNL  (Blank rows = not tested)   UPPER EXTREMITY MMT:   MMT Left eval Right eval  Shoulder flexion   5  Shoulder extension   5  Shoulder abduction   5  Shoulder adduction      Shoulder internal rotation   5  Shoulder external rotation   5  Middle trapezius      Lower trapezius      Elbow flexion 5 4+   Elbow extension 5 4+  Wrist flexion 5 4+  Wrist extension 5 4+  Wrist ulnar deviation 5 4+  Wrist radial deviation 5 4+  Wrist pronation 5 4+  Wrist supination 5 4+  Grip strength (lbs) 32/36/29/25/20 30/36/27/25/22  key 15# 10#P!  tip 12# 11#P!  (Blank rows = not tested)     JOINT MOBILITY TESTING:  Unremarkable and no instability noted   PALPATION:  TTP medial epicondyle, ulnar nerve proximal to medial epicondyle             TODAY'S TREATMENT:    [facility] Adult PT Treatment:                                                DATE: 07/30/22 Therapeutic Exercise: UBE lvl 3.0 x 3'/3' min while taking subjective R wrist flex stretch 2x45" Wrist ext 4# to fatigue Wrist flexion 4# to fatigue Rest radial deviation 4# to fatigue Practicing twisting motion with wooden dowel and velcro 2x to fatigue Standing tricep ext 3x10 13# Standing row - 13# - 3x15 Farmers carry 10# R UE x 180 ft  With BFR 50% occlusion 110 mmHg  Wrist flexion/extension therabar to fatigue Ulnar/radial deviation therabar to fatigue    PATIENT EDUCATION: Education details: Discussed eval findings, rehab rationale and POC and patient is in agreement  Person educated: Patient Education method: Explanation Education comprehension: verbalized understanding and needs further education   HOME EXERCISE PROGRAM: Access Code: [email or web address] URL: [email or web address] Date: 06/23/2022 Prepared by: Edwinna Areola  Exercises - Seated Wrist Extension Stretch  - 1 x daily - 7 x weekly - 2 reps - 30 sec hold - Standing Tricep Extensions with Resistance  - 1 x daily - 7 x weekly - 2 sets - 10 reps - red band hold   ASSESSMENT:   CLINICAL IMPRESSION: Lyan tolerated session well with no adverse reaction.  We continue to progress load and volume  of elbow and wrist exercises to good effect.  Pt reports high level of fatigue with minimal increase in pain following therapy.    OBJECTIVE IMPAIRMENTS: decreased  activity tolerance, decreased knowledge of condition, decreased strength, increased fascial restrictions, increased muscle spasms, impaired UE functional use, and pain.    ACTIVITY LIMITATIONS: carrying, lifting, and work tasks   PERSONAL FACTORS: Age, Past/current experiences, and Profession are also affecting patient's functional outcome.      GOALS: Goals reviewed with patient? Yes   SHORT TERM GOALS=LONG TERM GOALS: Target date: 07/16/2022 (extended to 09/16/2022)   Patient to demonstrate independence in HEP  Baseline: TBD due to time constraints 4/16: partially compliant Goal status: ongoing   2.  Increase R elbow strength to 5-/5 Baseline:    L  Eval R Eval R 4/16  Elbow flexion   4+ 5-  Elbow extension   4+ 5-  Wrist flexion   4+ 5-  Wrist extension   4+ 5-  Wrist ulnar deviation   4+ 5  Wrist radial deviation   4+ 5  Wrist pronation   4+ 5  Wrist supination   4+ 5    Goal status: MET   3.  Decrease pain to 2/10 Baseline: 6/10 4/16: 4/10 Goal status: ongoing   4.  Decrease DASH score to 21/55 Baseline: 31/55 4/16: QuickDASH Score: 38.6 / 100 = 38.6 % Goal status: ongoing     PLAN:   PT FREQUENCY: 1-2x/week   PT DURATION: 8 weeks (09/16/2022)   PLANNED INTERVENTIONS: Therapeutic exercises, Therapeutic activity, Neuromuscular re-education, Balance training, Gait training, Patient/Family education, Self Care, Joint mobilization, Dry Needling, Splintting, Taping, Ionotophoresis /ml Dexamethasone, Manual therapy, and Re-evaluation   PLAN FOR NEXT SESSION: HEP establishment, ROM and flexibility tasks, R wrist and elbow strengthening, aerobic work, functional activities, TPDN and e-stim as appropriate, manual techniques as tolerated, BFR?   Fredderick Phenix, PT 07/30/2022, 12:59 PM

## 2022-08-04 ENCOUNTER — Encounter: Payer: Self-pay | Admitting: Sports Medicine

## 2022-08-06 ENCOUNTER — Ambulatory Visit: Payer: 59 | Attending: Sports Medicine | Admitting: Physical Therapy

## 2022-08-06 ENCOUNTER — Encounter: Payer: Self-pay | Admitting: Physical Therapy

## 2022-08-06 DIAGNOSIS — M7701 Medial epicondylitis, right elbow: Secondary | ICD-10-CM

## 2022-08-06 DIAGNOSIS — M6281 Muscle weakness (generalized): Secondary | ICD-10-CM

## 2022-08-06 DIAGNOSIS — M25521 Pain in right elbow: Secondary | ICD-10-CM | POA: Diagnosis present

## 2022-08-06 NOTE — Therapy (Signed)
OUTPATIENT PHYSICAL THERAPY TREATMENT NOTE   Patient Name: Natalie Leblanc MRN: [id number] DOB:29-Mar-1991, 32 y.o., female Today's Date: 08/06/2022  PCP: Philip Aspen, Limmie Patricia, MD  REFERRING PROVIDER: Richardean Sale, DO   END OF SESSION:   PT End of Session - 08/06/22 1131     Visit Number 9    Date for PT Re-Evaluation 09/16/22    Authorization Type [employer]    PT Start Time 1130    PT Stop Time 1211    PT Time Calculation (min) 41 min    Activity Tolerance Patient tolerated treatment well;Patient limited by pain    Behavior During Therapy WFL for tasks assessed/performed             Past Medical History:  Diagnosis Date   COVID    Depression    PTSD (post-traumatic stress disorder)    Past Surgical History:  Procedure Laterality Date   BREAST LUMPECTOMY Left    benign per patient   Patient Active Problem List   Diagnosis Date Noted   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 12/28/2019   Vitamin D deficiency 12/23/2019   Depression    PTSD (post-traumatic stress disorder)     REFERRING DIAG: M25.521 (ICD-10-CM) - Right elbow pain M77.01 (ICD-10-CM) - Medial epicondylitis of right elbow   THERAPY DIAG:  Pain in right elbow  Muscle weakness  Medial epicondylitis of elbow, right  Rationale for Evaluation and Treatment Rehabilitation  PERTINENT HISTORY: None   PRECAUTIONS: None   SUBJECTIVE:                                                                                                                                                                                      SUBJECTIVE STATEMENT:  Pt reports that her elbow is doing well overall and is thinking of return to work with lifting restrictions.   PAIN:  Are you having pain?  Yes: NPRS scale: 0-8(yesterday at shockwave therapy)/10 Pain location: medial R elbow Pain description: ache Aggravating factors: pronation and resisted activities Relieving factors:  rest   OBJECTIVE: (objective measures completed at initial evaluation unless otherwise dated)  DIAGNOSTIC FINDINGS:  CLINICAL DATA:  Elbow pain and limited range of motion   EXAM: MRI OF THE RIGHT ELBOW WITHOUT CONTRAST   TECHNIQUE: Multiplanar, multisequence MR imaging of the elbow was performed. No intravenous contrast was administered.   COMPARISON:  Radiographs 12/24/2021   FINDINGS: TENDONS   Common forearm flexor origin: Unremarkable   Common forearm extensor origin: Unremarkable   Biceps: Unremarkable   Triceps: Unremarkable   LIGAMENTS   Medial stabilizers: Unremarkable   Lateral stabilizers:  Unremarkable   Cartilage: No  chondral defect observed.   Joint: No joint effusion or free fragment identified.   Cubital tunnel: Unremarkable   Bones: No significant extra-articular osseous abnormalities identified.   IMPRESSION: 1. No specific internal derangement is identified to explain the patient's symptoms.     Electronically Signed   By: Gaylyn Rong M.D.   On: 06/04/2022 08:35   PATIENT SURVEYS:  DASH 31/55 (45% functional)   POSTURE: unremarkable   UPPER EXTREMITY ROM:    Active ROM Right eval Left eval  Shoulder flexion WNL WNL  Shoulder extension WNL WNL  Shoulder abduction WNL WNL  Shoulder adduction WNL WNL  Shoulder internal rotation WNL WNL  Shoulder external rotation WNL WNL  Elbow flexion WNL WNL  Elbow extension WNL WNL  Wrist flexion WNL WNL  Wrist extension WNL WNL  Wrist ulnar deviation WNL WNL  Wrist radial deviation WNL WNL  Wrist pronation WNL WNL  Wrist supination WNL WNL  (Blank rows = not tested)   UPPER EXTREMITY MMT:   MMT Left eval Right eval  Shoulder flexion   5  Shoulder extension   5  Shoulder abduction   5  Shoulder adduction      Shoulder internal rotation   5  Shoulder external rotation   5  Middle trapezius      Lower trapezius      Elbow flexion 5 4+  Elbow extension 5 4+  Wrist  flexion 5 4+  Wrist extension 5 4+  Wrist ulnar deviation 5 4+  Wrist radial deviation 5 4+  Wrist pronation 5 4+  Wrist supination 5 4+  Grip strength (lbs) 32/36/29/25/20 30/36/27/25/22  key 15# 10#P!  tip 12# 11#P!  (Blank rows = not tested)     JOINT MOBILITY TESTING:  Unremarkable and no instability noted   PALPATION:  TTP medial epicondyle, ulnar nerve proximal to medial epicondyle             TODAY'S TREATMENT:    [facility] Adult PT Treatment: Therapeutic Exercise: UBE lvl 4.0 x 3'/3' min while taking subjective Wrist flexion and ext roll - 3# (dowel and TB) - 5x ea R wrist flex stretch 2x45" Moving 10# weight to lower cabinet and to counter - 2x10 Standing tricep ext 3x10 13# Standing row - 13# - 3x15 Farmers carry 10# R UE x 180 ft  With BFR 50% occlusion 110 mmHg  Wrist flexion/extension therabar to fatigue Ulnar/radial deviation therabar to fatigue    PATIENT EDUCATION: Education details: Discussed eval findings, rehab rationale and POC and patient is in agreement  Person educated: Patient Education method: Explanation Education comprehension: verbalized understanding and needs further education   HOME EXERCISE PROGRAM: Access Code: [email or web address] URL: [email or web address] Date: 06/23/2022 Prepared by: Edwinna Areola  Exercises - Seated Wrist Extension Stretch  - 1 x daily - 7 x weekly - 2 reps - 30 sec hold - Standing Tricep Extensions with Resistance  - 1 x daily - 7 x weekly - 2 sets - 10 reps - red band hold   ASSESSMENT:   CLINICAL IMPRESSION: Leland tolerated session well with no adverse reaction.  Continuing to progress load and volume.  Added lifting above shoulder level to simulate work tasks.  Pt with significant fatigue and minimal increase in pain following therapy.    OBJECTIVE IMPAIRMENTS: decreased activity tolerance, decreased knowledge of condition, decreased strength, increased fascial restrictions, increased muscle spasms,  impaired UE functional use, and pain.    ACTIVITY LIMITATIONS: carrying, lifting, and work tasks  PERSONAL FACTORS: Age, Past/current experiences, and Profession are also affecting patient's functional outcome.      GOALS: Goals reviewed with patient? Yes   SHORT TERM GOALS=LONG TERM GOALS: Target date: 07/16/2022 (extended to 09/16/2022)   Patient to demonstrate independence in HEP  Baseline: TBD due to time constraints 4/16: partially compliant Goal status: ongoing   2.  Increase R elbow strength to 5-/5 Baseline:    L  Eval R Eval R 4/16  Elbow flexion   4+ 5-  Elbow extension   4+ 5-  Wrist flexion   4+ 5-  Wrist extension   4+ 5-  Wrist ulnar deviation   4+ 5  Wrist radial deviation   4+ 5  Wrist pronation   4+ 5  Wrist supination   4+ 5    Goal status: MET   3.  Decrease pain to 2/10 Baseline: 6/10 4/16: 4/10 Goal status: ongoing   4.  Decrease DASH score to 21/55 Baseline: 31/55 4/16: QuickDASH Score: 38.6 / 100 = 38.6 % Goal status: ongoing     PLAN:   PT FREQUENCY: 1-2x/week   PT DURATION: 8 weeks (09/16/2022)   PLANNED INTERVENTIONS: Therapeutic exercises, Therapeutic activity, Neuromuscular re-education, Balance training, Gait training, Patient/Family education, Self Care, Joint mobilization, Dry Needling, Splintting, Taping, Ionotophoresis 4mg /ml Dexamethasone, Manual therapy, and Re-evaluation   PLAN FOR NEXT SESSION: HEP establishment, ROM and flexibility tasks, R wrist and elbow strengthening, aerobic work, functional activities, TPDN and e-stim as appropriate, manual techniques as tolerated, BFR?   Fredderick Phenix, PT 08/06/2022, 12:14 PM

## 2022-08-11 NOTE — Telephone Encounter (Signed)
New letter sent via mychart.

## 2022-08-13 ENCOUNTER — Ambulatory Visit: Payer: 59

## 2022-08-13 DIAGNOSIS — M6281 Muscle weakness (generalized): Secondary | ICD-10-CM

## 2022-08-13 DIAGNOSIS — M25521 Pain in right elbow: Secondary | ICD-10-CM | POA: Diagnosis not present

## 2022-08-13 NOTE — Therapy (Signed)
OUTPATIENT PHYSICAL THERAPY TREATMENT NOTE   Patient Name: Natalie Leblanc MRN: [id number] DOB:Aug 24, 1990, 32 y.o., female Today's Date: 08/13/2022  PCP: Philip Aspen, Limmie Patricia, MD  REFERRING PROVIDER: Richardean Sale, DO   END OF SESSION:   PT End of Session - 08/13/22 1130     Visit Number 10    Date for PT Re-Evaluation 09/16/22    Authorization Type [employer]    PT Start Time 1130    PT Stop Time 1210    PT Time Calculation (min) 40 min    Activity Tolerance Patient tolerated treatment well;Patient limited by pain    Behavior During Therapy WFL for tasks assessed/performed              Past Medical History:  Diagnosis Date   COVID    Depression    PTSD (post-traumatic stress disorder)    Past Surgical History:  Procedure Laterality Date   BREAST LUMPECTOMY Left    benign per patient   Patient Active Problem List   Diagnosis Date Noted   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 12/28/2019   Vitamin D deficiency 12/23/2019   Depression    PTSD (post-traumatic stress disorder)     REFERRING DIAG: M25.521 (ICD-10-CM) - Right elbow pain M77.01 (ICD-10-CM) - Medial epicondylitis of right elbow   THERAPY DIAG:  Pain in right elbow  Muscle weakness  Rationale for Evaluation and Treatment Rehabilitation  PERTINENT HISTORY: None   PRECAUTIONS: None   SUBJECTIVE:                                                                                                                                                                                      SUBJECTIVE STATEMENT:  Pt presents to PT with continued reports of pain in R wrist. Has been compliant with HEP with no adverse effect.   PAIN:  Are you having pain?  Yes: NPRS scale: 0-8(yesterday at shockwave therapy)/10 Pain location: medial R elbow Pain description: ache Aggravating factors: pronation and resisted activities Relieving factors: rest   OBJECTIVE: (objective measures completed  at initial evaluation unless otherwise dated)  PATIENT SURVEYS:  DASH 31/55 (45% functional)   POSTURE: unremarkable   UPPER EXTREMITY ROM:    Active ROM Right eval Left eval  Shoulder flexion WNL WNL  Shoulder extension WNL WNL  Shoulder abduction WNL WNL  Shoulder adduction WNL WNL  Shoulder internal rotation WNL WNL  Shoulder external rotation WNL WNL  Elbow flexion WNL WNL  Elbow extension WNL WNL  Wrist flexion WNL WNL  Wrist extension WNL WNL  Wrist ulnar deviation WNL WNL  Wrist radial deviation WNL WNL  Wrist pronation WNL  WNL  Wrist supination WNL WNL  (Blank rows = not tested)   UPPER EXTREMITY MMT:   MMT Left eval Right eval  Shoulder flexion   5  Shoulder extension   5  Shoulder abduction   5  Shoulder adduction      Shoulder internal rotation   5  Shoulder external rotation   5  Middle trapezius      Lower trapezius      Elbow flexion 5 4+  Elbow extension 5 4+  Wrist flexion 5 4+  Wrist extension 5 4+  Wrist ulnar deviation 5 4+  Wrist radial deviation 5 4+  Wrist pronation 5 4+  Wrist supination 5 4+  Grip strength (lbs) 32/36/29/25/20 30/36/27/25/22  key 15# 10#P!  tip 12# 11#P!  (Blank rows = not tested)     JOINT MOBILITY TESTING:  Unremarkable and no instability noted   PALPATION:  TTP medial epicondyle, ulnar nerve proximal to medial epicondyle             TODAY'S TREATMENT:    [facility] Adult PT Treatment: Therapeutic Exercise: UBE lvl 2.0 x 3'/3' min while taking subjective Wrist flexion and ext roll - 3# (dowel and TB) - 5x ea Velcro roller 2x5  R wrist flex stretch 2x45" Moving 10# weight to lower cabinet and to counter - 2x10 Standing tricep ext 3x10 13# Standing row - 13# - 3x15 Farmers carry 10# R UE 165ft x 2  PATIENT EDUCATION: Education details: Discussed eval findings, rehab rationale and POC and patient is in agreement  Person educated: Patient Education method: Explanation Education comprehension: verbalized  understanding and needs further education   HOME EXERCISE PROGRAM: Access Code: [email or web address] URL: [email or web address] Date: 06/23/2022 Prepared by: Edwinna Areola  Exercises - Seated Wrist Extension Stretch  - 1 x daily - 7 x weekly - 2 reps - 30 sec hold - Standing Tricep Extensions with Resistance  - 1 x daily - 7 x weekly - 2 sets - 10 reps - red band hold   ASSESSMENT:   CLINICAL IMPRESSION: Pt was able to complete prescribed exercises with no adverse effect. Noted fatigue post session, continues to progress well with strengthening. PT will continue per POC as prescribed.    OBJECTIVE IMPAIRMENTS: decreased activity tolerance, decreased knowledge of condition, decreased strength, increased fascial restrictions, increased muscle spasms, impaired UE functional use, and pain.    ACTIVITY LIMITATIONS: carrying, lifting, and work tasks   PERSONAL FACTORS: Age, Past/current experiences, and Profession are also affecting patient's functional outcome.      GOALS: Goals reviewed with patient? Yes   SHORT TERM GOALS=LONG TERM GOALS: Target date: 07/16/2022 (extended to 09/16/2022)   Patient to demonstrate independence in HEP  Baseline: TBD due to time constraints 4/16: partially compliant Goal status: ongoing   2.  Increase R elbow strength to 5-/5 Baseline:    L  Eval R Eval R 4/16  Elbow flexion   4+ 5-  Elbow extension   4+ 5-  Wrist flexion   4+ 5-  Wrist extension   4+ 5-  Wrist ulnar deviation   4+ 5  Wrist radial deviation   4+ 5  Wrist pronation   4+ 5  Wrist supination   4+ 5    Goal status: MET   3.  Decrease pain to 2/10 Baseline: 6/10 4/16: 4/10 Goal status: ongoing   4.  Decrease DASH score to 21/55 Baseline: 31/55 4/16: QuickDASH Score: 38.6 / 100 = 38.6 % Goal status: ongoing  PLAN:   PT FREQUENCY: 1-2x/week   PT DURATION: 8 weeks (09/16/2022)   PLANNED INTERVENTIONS: Therapeutic exercises, Therapeutic activity, Neuromuscular  re-education, Balance training, Gait training, Patient/Family education, Self Care, Joint mobilization, Dry Needling, Splintting, Taping, Ionotophoresis 4mg /ml Dexamethasone, Manual therapy, and Re-evaluation   PLAN FOR NEXT SESSION: HEP establishment, ROM and flexibility tasks, R wrist and elbow strengthening, aerobic work, functional activities, TPDN and e-stim as appropriate, manual techniques as tolerated, BFR?   Eloy End, PT 08/13/2022, 1:49 PM

## 2022-08-20 ENCOUNTER — Ambulatory Visit: Payer: 59

## 2022-08-20 DIAGNOSIS — M25521 Pain in right elbow: Secondary | ICD-10-CM | POA: Diagnosis not present

## 2022-08-20 DIAGNOSIS — M6281 Muscle weakness (generalized): Secondary | ICD-10-CM

## 2022-08-20 DIAGNOSIS — M7701 Medial epicondylitis, right elbow: Secondary | ICD-10-CM

## 2022-08-20 NOTE — Therapy (Addendum)
OUTPATIENT PHYSICAL THERAPY TREATMENT NOTE/DC SUMMARY   Patient Name: Natalie Leblanc MRN: [id number] DOB:1990/04/24, 32 y.o., female Today's Date: 08/20/2022  PCP: Philip Aspen, Limmie Patricia, MD  REFERRING PROVIDER: Richardean Sale, DO  PHYSICAL THERAPY DISCHARGE SUMMARY  Visits from Start of Care: 11  Current functional level related to goals / functional outcomes: UTA   Remaining deficits: pain   Education / Equipment: HEP   Patient agrees to discharge. Patient goals were partially met. Patient is being discharged due to not returning since the last visit.  END OF SESSION:   PT End of Session - 08/20/22 1140     Visit Number 11    Date for PT Re-Evaluation 09/16/22    Authorization Type [employer]    PT Start Time 1140   Pt arrived 10 mins late to appt   PT Stop Time 1215    PT Time Calculation (min) 35 min    Activity Tolerance Patient tolerated treatment well;Patient limited by pain    Behavior During Therapy WFL for tasks assessed/performed             Past Medical History:  Diagnosis Date   COVID    Depression    PTSD (post-traumatic stress disorder)    Past Surgical History:  Procedure Laterality Date   BREAST LUMPECTOMY Left    benign per patient   Patient Active Problem List   Diagnosis Date Noted   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 12/28/2019   Vitamin D deficiency 12/23/2019   Depression    PTSD (post-traumatic stress disorder)     REFERRING DIAG: M25.521 (ICD-10-CM) - Right elbow pain M77.01 (ICD-10-CM) - Medial epicondylitis of right elbow   THERAPY DIAG:  Pain in right elbow  Muscle weakness  Medial epicondylitis of elbow, right  Rationale for Evaluation and Treatment Rehabilitation  PERTINENT HISTORY: None   PRECAUTIONS: None   SUBJECTIVE:                                                                                                                                                                                       SUBJECTIVE STATEMENT: Patient reports that going back to work has exacerbated her pain, but particularly in her legs and lower back.    PAIN:  Are you having pain?  Yes: NPRS scale: 4-8/10 Pain location: medial R elbow Pain description: ache Aggravating factors: pronation and resisted activities Relieving factors: rest   OBJECTIVE: (objective measures completed at initial evaluation unless otherwise dated)  PATIENT SURVEYS:  DASH 31/55 (45% functional)   POSTURE: unremarkable   UPPER EXTREMITY ROM:    Active ROM Right eval Left eval  Shoulder flexion  WNL WNL  Shoulder extension WNL WNL  Shoulder abduction WNL WNL  Shoulder adduction WNL WNL  Shoulder internal rotation WNL WNL  Shoulder external rotation WNL WNL  Elbow flexion WNL WNL  Elbow extension WNL WNL  Wrist flexion WNL WNL  Wrist extension WNL WNL  Wrist ulnar deviation WNL WNL  Wrist radial deviation WNL WNL  Wrist pronation WNL WNL  Wrist supination WNL WNL  (Blank rows = not tested)   UPPER EXTREMITY MMT:   MMT Left eval Right eval  Shoulder flexion   5  Shoulder extension   5  Shoulder abduction   5  Shoulder adduction      Shoulder internal rotation   5  Shoulder external rotation   5  Middle trapezius      Lower trapezius      Elbow flexion 5 4+  Elbow extension 5 4+  Wrist flexion 5 4+  Wrist extension 5 4+  Wrist ulnar deviation 5 4+  Wrist radial deviation 5 4+  Wrist pronation 5 4+  Wrist supination 5 4+  Grip strength (lbs) 32/36/29/25/20 30/36/27/25/22  key 15# 10#P!  tip 12# 11#P!  (Blank rows = not tested)     JOINT MOBILITY TESTING:  Unremarkable and no instability noted   PALPATION:  TTP medial epicondyle, ulnar nerve proximal to medial epicondyle             TODAY'S TREATMENT:   [facility] Adult PT Treatment:                                                DATE: 08/20/22 Therapeutic Exercise: UBE lvl 2.0 x 3'/3' min while taking subjective Wrist flexion and ext  roll - 3# (dowel and TB) - 5x ea Velcro roller 2x5 (handle and key grip) Moving 10# weight to lower cabinet and to counter - 2x10 Standing tricep ext 2x10 13# x10 10# (fatigue) Standing row - 13# - 3x10    [facility] Adult PT Treatment: Therapeutic Exercise: UBE lvl 2.0 x 3'/3' min while taking subjective Wrist flexion and ext roll - 3# (dowel and TB) - 5x ea Velcro roller 2x5  R wrist flex stretch 2x45" Moving 10# weight to lower cabinet and to counter - 2x10 Standing tricep ext 3x10 13# Standing row - 13# - 3x15 Farmers carry 10# R UE 177ft x 2  PATIENT EDUCATION: Education details: Discussed eval findings, rehab rationale and POC and patient is in agreement  Person educated: Patient Education method: Explanation Education comprehension: verbalized understanding and needs further education   HOME EXERCISE PROGRAM: Access Code: [email or web address] URL: [email or web address] Date: 06/23/2022 Prepared by: Edwinna Areola  Exercises - Seated Wrist Extension Stretch  - 1 x daily - 7 x weekly - 2 reps - 30 sec hold - Standing Tricep Extensions with Resistance  - 1 x daily - 7 x weekly - 2 sets - 10 reps - red band hold   ASSESSMENT:   CLINICAL IMPRESSION: Patient presents to PT reporting increased overall pain today from returning to work this week where she works 10 hrs/day 5 days/week. Session today focused on wrist, elbow, and periscapular strengthening. Patient was able to tolerate all prescribed exercises with no adverse effects. Patient continues to benefit from skilled PT services and should be progressed as able to improve functional independence.    OBJECTIVE IMPAIRMENTS: decreased activity tolerance, decreased knowledge  of condition, decreased strength, increased fascial restrictions, increased muscle spasms, impaired UE functional use, and pain.    ACTIVITY LIMITATIONS: carrying, lifting, and work tasks   PERSONAL FACTORS: Age, Past/current experiences, and Profession  are also affecting patient's functional outcome.      GOALS: Goals reviewed with patient? Yes   SHORT TERM GOALS=LONG TERM GOALS: Target date: 07/16/2022 (extended to 09/16/2022)   Patient to demonstrate independence in HEP  Baseline: TBD due to time constraints 4/16: partially compliant Goal status: ongoing   2.  Increase R elbow strength to 5-/5 Baseline:    L  Eval R Eval R 4/16  Elbow flexion   4+ 5-  Elbow extension   4+ 5-  Wrist flexion   4+ 5-  Wrist extension   4+ 5-  Wrist ulnar deviation   4+ 5  Wrist radial deviation   4+ 5  Wrist pronation   4+ 5  Wrist supination   4+ 5    Goal status: MET   3.  Decrease pain to 2/10 Baseline: 6/10 4/16: 4/10 Goal status: ongoing   4.  Decrease DASH score to 21/55 Baseline: 31/55 4/16: QuickDASH Score: 38.6 / 100 = 38.6 % Goal status: ongoing     PLAN:   PT FREQUENCY: 1-2x/week   PT DURATION: 8 weeks (09/16/2022)   PLANNED INTERVENTIONS: Therapeutic exercises, Therapeutic activity, Neuromuscular re-education, Balance training, Gait training, Patient/Family education, Self Care, Joint mobilization, Dry Needling, Splintting, Taping, Ionotophoresis 4mg /ml Dexamethasone, Manual therapy, and Re-evaluation   PLAN FOR NEXT SESSION: HEP establishment, ROM and flexibility tasks, R wrist and elbow strengthening, aerobic work, functional activities, TPDN and e-stim as appropriate, manual techniques as tolerated, BFR?   Berta Minor, PTA 08/20/2022, 11:41 AM

## 2022-08-27 ENCOUNTER — Ambulatory Visit: Payer: 59

## 2022-09-04 ENCOUNTER — Telehealth: Payer: Self-pay | Admitting: Sports Medicine

## 2022-09-04 NOTE — Telephone Encounter (Signed)
Raiford Fults from [facility] [employer] called stating that they are needing more information on the restrictions that were given and a possible return tow ork date. He can be reached at [phone] x [phone].

## 2022-09-08 NOTE — Progress Notes (Unsigned)
    Natalie Leblanc D.[facility] [address] [place] [address] Phone: [phone]   Assessment and Plan:     There are no diagnoses linked to this encounter.  ***   Pertinent previous records reviewed include ***   Follow Up: ***     Subjective:   I, Natalie Leblanc, am serving as a Neurosurgeon for Doctor Richardean Sale   Chief Complaint: right elbow pain    HPI:    12/24/21 Patient is a 32 year old female complaining of right elbow pain . Patient states  she suffered an injury at work about a year ago.  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  She is wondering what she can do. She states that the task of lifting the boxes undid all of her healing . Looks for new restrictions .  Went to work place occupational therapy for 3 weeks.    No numbness or tingling in the elbow , feels like she can hear her elbow grinding, no meds for the pain, is not able to lift things with out pain, one of the meds she takes makes her drowsy, hurts her when she extends her arm    01/14/2022 Patient states that its a little better but if we take the restrictions off she thinks the pain will come back    02/10/2022 Patient states that its probably a little better  but she keeps hitting it on things   04/14/2022 Patient states elbow is the same, only hurts when she lifts heavy , taking out the trash    05/22/2022 Patient states she needs FMLA , states she still has elbow pain    07/28/2022 Patient states the elbow is okay , still has pain    09/09/2022 Patient states    Relevant Historical Information: None pertinent  Additional pertinent review of systems negative.   Current Outpatient Medications:    hydrOXYzine (ATARAX) 25 MG tablet, Take 25 mg by mouth at bedtime as needed., Disp: , Rfl:    Multiple Vitamin (MULTIVITAMIN)  capsule, Take 1 capsule by mouth daily., Disp: , Rfl:    rizatriptan (MAXALT) 10 MG tablet, Take 1 tablet (10 mg total) by mouth as needed for migraine. May repeat in 2 hours if needed, Disp: 10 tablet, Rfl: 6   sertraline (ZOLOFT) 100 MG tablet, Take 100 mg by mouth daily., Disp: , Rfl:    Objective:     There were no vitals filed for this visit.    There is no height or weight on file to calculate BMI.    Physical Exam:    ***   Electronically signed by:  Natalie Leblanc [facility] 12:24 PM 09/08/22

## 2022-09-09 ENCOUNTER — Ambulatory Visit (INDEPENDENT_AMBULATORY_CARE_PROVIDER_SITE_OTHER): Payer: 59 | Admitting: Sports Medicine

## 2022-09-09 VITALS — BP 120/80 | HR 78 | Ht 59.0 in | Wt 100.0 lb

## 2022-09-09 DIAGNOSIS — M25521 Pain in right elbow: Secondary | ICD-10-CM

## 2022-09-09 DIAGNOSIS — M67431 Ganglion, right wrist: Secondary | ICD-10-CM

## 2022-09-09 DIAGNOSIS — M7701 Medial epicondylitis, right elbow: Secondary | ICD-10-CM

## 2022-09-09 NOTE — Patient Instructions (Addendum)
Medical release form  Recommend continuing lifting no no more 20 lbs and no repetitive motion with right upper extremity 8 weeks  General surgery referral  Continue PT and dry needling Can call us for shockwave

## 2022-09-10 ENCOUNTER — Other Ambulatory Visit: Payer: Self-pay | Admitting: Sports Medicine

## 2022-09-10 DIAGNOSIS — M67431 Ganglion, right wrist: Secondary | ICD-10-CM

## 2022-09-30 DIAGNOSIS — M67431 Ganglion, right wrist: Secondary | ICD-10-CM | POA: Insufficient documentation

## 2022-09-30 DIAGNOSIS — M25531 Pain in right wrist: Secondary | ICD-10-CM | POA: Insufficient documentation

## 2022-11-27 NOTE — Progress Notes (Signed)
Natalie Leblanc [facility] [address] [place] [address] Phone: [phone]   Assessment and Plan:     1. Right elbow pain 2. 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 - Recommend restarting physical therapy.  Referral sent - Recommend limiting pushing, pulling, lifting to 20 pounds maximum with right upper extremity.  Work note provided for 8 weeks or until reevaluated - Continue HEP and may use bracing as needed - Continue Tylenol/NSAIDs as needed  Pertinent previous records reviewed include orthopedic surgery note 11/03/2022, orthopedic surgery note 09/30/2022, patient surgical referral message 09/19/2022   Follow Up: 8 weeks for reevaluation.  Could discuss dry needling at elbow, hydrodissection, PRP injection, additional ECSWT   Subjective:    Chief Complaint: post surgery follow up   HPI:   12/24/21 Patient is a 32 year old female complaining of right elbow pain . Patient states  she suffered an injury at work about a year ago.  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  She is wondering what she can do. She states that the task of lifting the boxes undid all of her healing . Looks for new restrictions .  Went to work place occupational therapy for 3 weeks.    No numbness or tingling in the elbow , feels like she can hear her elbow grinding, no meds for the pain, is not able to lift things with out pain, one of the meds she takes makes her drowsy, hurts her when she extends her arm    01/14/2022 Patient states that its a little better but if we take  the restrictions off she thinks the pain will come back    02/10/2022 Patient states that its probably a little better  but she keeps hitting it on things   04/14/2022 Patient states elbow is the same, only hurts when she lifts heavy , taking out the trash    05/22/2022 Patient states she needs FMLA , states she still has elbow pain    07/28/2022 Patient states the elbow is okay , still has pain    09/09/2022 Patient states she needs to follow up on work restrictions, recommendations for the cyst that has developed in her wrist she was told by PT, she needs a diagnosis for work  11/28/2022 Today patient states that she needs updated work restrictions and a new referral for physical therapy. Feeling stronger overall but continues to have elbow pain. Needs 20 lb lifting/pushing restriction. Avoid repetitive motions. S/P right wrist surgery, recovering well.   Relevant Historical Information: none pertinent   Additional pertinent review of systems negative.   Current Outpatient Medications:    hydrOXYzine (ATARAX) 25 MG tablet, Take 25 mg by mouth at bedtime as needed., Disp: , Rfl:    Multiple Vitamin (MULTIVITAMIN) capsule, Take 1 capsule by mouth daily., Disp: , Rfl:    rizatriptan (MAXALT) 10 MG tablet, Take 1 tablet (10 mg total) by mouth as needed for migraine. May repeat in 2 hours if needed, Disp: 10 tablet, Rfl: 6   sertraline (ZOLOFT) 100 MG tablet, Take 100 mg by mouth daily., Disp: ,  Rfl:    Objective:     Vitals:   11/28/22 1118  BP: 104/72  Pulse: 85  SpO2: 100%  Weight: 102 lb 9.6 oz (46.5 kg)  Height: 4\' 11"  (1.499 m)      Body mass index is 20.72 kg/m.    Physical Exam:    General: Appears well, no acute distress, nontoxic and pleasant Neck: FROM, no pain Neuro: sensation is intact distally with no deficits, strenghth is 5/5 in elbow flexors/extenders/supinator/pronators and wrist flexors/extensors Psych: no evidence of anxiety or depression   Right ELBOW  and upper extremity: no deformity, swelling or muscle wasting Normal Carrying angle ROM:0-140, supination and pronation 90 TTP medial epicondyle   NTTP over triceps, ticeps tendon, olecronon, lat epicondyle,  antecubital fossa, biceps tendon, supinator, pronator Negative tinnels over cubital tunnel Medial elbow pain with resisted wrist and middle digit extension Mild pain with resisted wrist flexion Mild pain with resisted supination Moderate pain with resisted pronation Negative valgus stress Negative varus stress Negative milking maneuver  Small well-healed surgical scar over dorsum of wrist at location where ganglion cyst used to be    Electronically signed by:  Natalie Leblanc [facility] 11:40 AM 11/28/22

## 2022-11-28 ENCOUNTER — Encounter: Payer: Self-pay | Admitting: Sports Medicine

## 2022-11-28 ENCOUNTER — Ambulatory Visit (INDEPENDENT_AMBULATORY_CARE_PROVIDER_SITE_OTHER): Payer: 59 | Admitting: Sports Medicine

## 2022-11-28 VITALS — BP 104/72 | HR 85 | Ht 59.0 in | Wt 102.6 lb

## 2022-11-28 DIAGNOSIS — M25521 Pain in right elbow: Secondary | ICD-10-CM | POA: Diagnosis not present

## 2022-11-28 DIAGNOSIS — M7701 Medial epicondylitis, right elbow: Secondary | ICD-10-CM

## 2022-11-28 NOTE — Patient Instructions (Signed)
A new referral has been placed for Physical Therapy.   An updated work note has been provided.   Follow up in 8 weeks.

## 2022-12-01 ENCOUNTER — Ambulatory Visit: Payer: 59 | Admitting: Psychiatry

## 2022-12-02 ENCOUNTER — Ambulatory Visit: Payer: 59

## 2022-12-02 NOTE — Therapy (Deleted)
OUTPATIENT PHYSICAL THERAPY UPPER EXTREMITY EVALUATION   Patient Name: Natalie Leblanc MRN: [id number] DOB:06-23-1990, 32 y.o., female Today's Date: 12/02/2022  END OF SESSION:   Past Medical History:  Diagnosis Date   COVID    Depression    PTSD (post-traumatic stress disorder)    Past Surgical History:  Procedure Laterality Date   BREAST LUMPECTOMY Left    benign per patient   Patient Active Problem List   Diagnosis Date Noted   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 12/28/2019   Vitamin D deficiency 12/23/2019   Depression    PTSD (post-traumatic stress disorder)     PCP: Philip Aspen, Limmie Patricia, MD   REFERRING PROVIDER: Richardean Sale, DO  REFERRING DIAG: [phone] (ICD-10-CM) - Right elbow pain  THERAPY DIAG:  Pain in right elbow  Stiffness of right elbow, not elsewhere classified  Muscle weakness  Cramp and spasm  Rationale for Evaluation and Treatment: Rehabilitation  ONSET DATE: 11/28/2022  SUBJECTIVE:                                                                                                                                                                                      SUBJECTIVE STATEMENT: *** Hand dominance: {MISC; OT HAND [phone]}  PERTINENT HISTORY: Evaluate and Treat for right elbow pain 1-2 times per week for 4-6 weeks.   Decrease pain, increase strength, flexibility, function, and range of motion.  Modalities may include, traction, ionto, phono, stim, and dry needling prn.  PAIN:  Are you having pain? Yes: {yespain:27235::"NPRS scale: ***/10","Pain location: ***","Pain description: ***","Aggravating factors: ***","Relieving factors: ***"}  PRECAUTIONS: None  RED FLAGS: None   WEIGHT BEARING RESTRICTIONS: No  FALLS:  Has patient fallen in last 6 months? No  LIVING ENVIRONMENT: Lives with: lives with their family Lives in: House/apartment   OCCUPATION: ***  PLOF:  {PLOF:24004}  PATIENT GOALS: ***  NEXT MD VISIT: ***  OBJECTIVE:   DIAGNOSTIC FINDINGS:  ***  PATIENT SURVEYS :  FOTO ***  COGNITION: Overall cognitive status: Within functional limits for tasks assessed     SENSATION: WFL  POSTURE: ***  UPPER EXTREMITY ROM:   Active ROM Right eval Left eval  Shoulder flexion    Shoulder extension    Shoulder abduction    Shoulder adduction    Shoulder internal rotation    Shoulder external rotation    Elbow flexion    Elbow extension    Wrist flexion    Wrist extension    Wrist ulnar deviation    Wrist radial deviation    Wrist pronation    Wrist supination    (Blank rows = not tested)  UPPER  EXTREMITY MMT:  MMT Right eval Left eval  Shoulder flexion    Shoulder extension    Shoulder abduction    Shoulder adduction    Shoulder internal rotation    Shoulder external rotation    Middle trapezius    Lower trapezius    Elbow flexion    Elbow extension    Wrist flexion    Wrist extension    Wrist ulnar deviation    Wrist radial deviation    Wrist pronation    Wrist supination    Grip strength (lbs)    (Blank rows = not tested)  SHOULDER SPECIAL TESTS: Impingement tests: Hawkins/Kennedy impingement test: {pos/neg:25230} and Painful arc test: {pos/neg:25230} SLAP lesions: Biceps load test: {pos/neg:25230} Instability tests: Apprehension test: {pos/neg:25230} Rotator cuff assessment: Drop arm test: {pos/neg:25230} and Empty can test: {pos/neg:25230} Biceps assessment: Speed's test: {pos/neg:25230}  JOINT MOBILITY TESTING:  ***  PALPATION:  ***   TODAY'S TREATMENT:                                                                                                                                         DATE: 12/02/22 Initial eval completed and initiated HEP  PATIENT EDUCATION: Education details: Initiated HEP Person educated: Patient Education method: Programmer, multimedia, Facilities manager, Verbal cues, and  Handouts Education comprehension: verbalized understanding, returned demonstration, and verbal cues required  HOME EXERCISE PROGRAM: ***  ASSESSMENT:  CLINICAL IMPRESSION: Patient is a 32 y.o. female who was seen today for physical therapy evaluation and treatment for right elbow pain.  She initially had pain in September of 2023.  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  She has been treated by Dr.  Jean Rosenthal intermittently since that time for the elbow pain but also had cyst removed from the back of her hand in the last year.  Since recovering for that she has been back to work and her elbow pain seems to be returning.  She presents with weakness in the supraspinatus and infraspinatus of the right shoulder,  pain with resisted wrist flexion and tenderness at the medial epicondyle.  These symptoms are consistent with her initial diagnosis of medial epicondylitis.  Her slow healing may be due to the weakness in her right shoulder which is causing her to compensate at the elbow and wrist.  We will focus on rotator cuff strengthening, scapular stabilization, eccentric wrist strengthening, stretching, and modalities for pain relief.     OBJECTIVE IMPAIRMENTS: decreased ROM, decreased strength, increased muscle spasms, impaired UE functional use, and pain.   ACTIVITY LIMITATIONS: carrying, lifting, transfers, bathing, dressing, and hygiene/grooming  PARTICIPATION LIMITATIONS: meal prep, cleaning, laundry, driving, shopping, occupation, and yard work  PERSONAL FACTORS: 1 comorbidity: Depression/PTSD  are also affecting patient's functional outcome.   REHAB POTENTIAL: Good  CLINICAL DECISION MAKING: Stable/uncomplicated  EVALUATION COMPLEXITY: Low  GOALS: Goals reviewed with patient?  Yes  SHORT TERM GOALS: Target date: 12/30/2022   Patient will be independent with initial HEP  Baseline: Goal status: INITIAL  2.  Pain report to be no greater than  4/10  Baseline:  Goal status: INITIAL   LONG TERM GOALS: Target date: 01/27/2023   Patient to be independent with advanced HEP  Baseline:  Goal status: INITIAL  2.  Patient to report pain no greater than 2/10  Baseline:  Goal status: INITIAL  3.  Patient will be able to reach overhead into cabinets and on top of shelves without pain  Baseline:  Goal status: INITIAL  4.  Patient to be able to complete 10 simulated overhead lifts with 20 lbs without pain  Baseline:  Goal status: INITIAL  5.  Patient to be non tender at medial epicondyle Baseline:  Goal status: INITIAL  6.  Patient to be able to resume full work duties without restrictions  Baseline:  Goal status: INITIAL  PLAN: PT FREQUENCY: 1-2x/week  PT DURATION: 8 weeks  PLANNED INTERVENTIONS: Therapeutic exercises, Therapeutic activity, Neuromuscular re-education, Patient/Family education, Self Care, Joint mobilization, Dry Needling, Electrical stimulation, Cryotherapy, Moist heat, scar mobilization, Splintting, Taping, Ultrasound, Ionotophoresis 4mg /ml Dexamethasone, Manual therapy, and Re-evaluation  PLAN FOR NEXT SESSION: Review HEP, begin rotator cuff and scapular stabilization strengthening.   Vernell Barrier, PT 12/02/2022, 8:06 AM

## 2022-12-04 ENCOUNTER — Ambulatory Visit: Payer: 59 | Admitting: Rehabilitative and Restorative Service Providers"

## 2022-12-04 ENCOUNTER — Other Ambulatory Visit: Payer: Self-pay

## 2022-12-04 ENCOUNTER — Encounter: Payer: Self-pay | Admitting: Rehabilitative and Restorative Service Providers"

## 2022-12-04 ENCOUNTER — Ambulatory Visit: Payer: 59 | Attending: Sports Medicine | Admitting: Rehabilitative and Restorative Service Providers"

## 2022-12-04 DIAGNOSIS — M25521 Pain in right elbow: Secondary | ICD-10-CM | POA: Insufficient documentation

## 2022-12-04 DIAGNOSIS — M6281 Muscle weakness (generalized): Secondary | ICD-10-CM | POA: Insufficient documentation

## 2022-12-04 DIAGNOSIS — R252 Cramp and spasm: Secondary | ICD-10-CM | POA: Diagnosis present

## 2022-12-04 DIAGNOSIS — M7701 Medial epicondylitis, right elbow: Secondary | ICD-10-CM | POA: Insufficient documentation

## 2022-12-04 NOTE — Therapy (Signed)
OUTPATIENT PHYSICAL THERAPY UPPER EXTREMITY EVALUATION   Patient Name: Natalie Leblanc MRN: [id number] DOB:[birth year], 32 y.o., female Today's Date: 12/04/2022  END OF SESSION:  PT End of Session - 12/04/22 1240     Visit Number 1    Authorization Type U[employer]    PT Start Time 1234    PT Stop Time 1310    PT Time Calculation (min) 36 min    Activity Tolerance Patient tolerated treatment well    Behavior During Therapy WFL for tasks assessed/performed             Past Medical History:  Diagnosis Date   COVID    Depression    PTSD (post-traumatic stress disorder)    Past Surgical History:  Procedure Laterality Date   BREAST LUMPECTOMY Left    benign per patient   Patient Active Problem List   Diagnosis Date Noted   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 12/28/2019   Vitamin D deficiency 12/23/2019   Depression    PTSD (post-traumatic stress disorder)     PCP: Philip Aspen, Limmie Patricia, MD  REFERRING PROVIDER: Richardean Sale, DO  REFERRING DIAG: [phone] (ICD-10-CM) - Right elbow pain  THERAPY DIAG:  Pain in right elbow - Plan: PT plan of care cert/re-cert  Muscle weakness - Plan: PT plan of care cert/re-cert  Medial epicondylitis of elbow, right - Plan: PT plan of care cert/re-cert  Cramp and spasm - Plan: PT plan of care cert/re-cert  Rationale for Evaluation and Treatment: Rehabilitation  ONSET DATE: Last September (but had this injury in the past before)  SUBJECTIVE:                                                                                                                                                                                      SUBJECTIVE STATEMENT: Pt reports that she had this pain in the past and it did get better, however, she flared up her injury at work and her right elbow has hurt for nearly a year.  Pt does report that the PT that she had in the past did help some with pain.  Pt had a recent ganglion cyst  removed from her right wrist and that helped with her right wrist pain, but her elbow pain persists.  Hand dominance: Right  PERTINENT HISTORY: S/P Right Ganglion Cyst removal on 10/16/2022, PTSD  PAIN:  Are you having pain? Yes: NPRS scale: 8/10 Pain location: right medial elbow Pain description: sharp Aggravating factors: movement, pushing, reaching Relieving factors: medication, PT helped in the past  PRECAUTIONS: Other: 20 pound lifting restriction per Dr Edison Pace note  RED FLAGS: None   WEIGHT  BEARING RESTRICTIONS: No  FALLS:  Has patient fallen in last 6 months? Yes. 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  NEXT MD VISIT: Dr Jean Rosenthal 02/02/2023  OBJECTIVE:   DIAGNOSTIC FINDINGS:  Right Elbow MRI on 06/03/2022: IMPRESSION: 1. No specific internal derangement is identified to explain the patient's symptoms.  PATIENT SURVEYS :  Eval:  Quick Dash 52.27  COGNITION: Overall cognitive status: Within functional limits for tasks assessed     SENSATION: Pt reports that she has nerve pain "like when you hit your funny bone" at some points  POSTURE: Rounded shoulders, forward flexed  UPPER EXTREMITY ROM:   Eval:  Pt has ROM WFL, however, has pain with right elbow flexion/extension and supination/pronation  UPPER EXTREMITY MMT:  Eval:   Right shoulder and elbow strength of 4/5 Left shoulder/UE strength is 4+ to 5/5 grossly throughout Right grip strength of 20  lbs Left grip strength of 25 lbs  SHOULDER SPECIAL TESTS: Impingement tests: Hawkins/Kennedy impingement test: positive  reports pain in right elbow and shoulder Rotator cuff assessment: Empty can test: positive  reports shoulder pain on right   PALPATION:  Tender to palpation around right shoulder region and biceps tendon insertion. Tenderness with muscle spasms noted in right medial elbow region   TODAY'S TREATMENT:                                                                                                                                          DATE: 12/04/2022 Issued HEP (see below) If treatment provided at initial evaluation, no treatment charged due to lack of authorization.       PATIENT EDUCATION: Education details: Issued HEP Person educated: Patient Education method: Explanation, Demonstration, and Handouts Education comprehension: verbalized understanding and returned demonstration  HOME EXERCISE PROGRAM: Access Code: [email or web address] URL: [email or web address] Date: 12/04/2022 Prepared by: Clydie Braun Alford Gamero  Exercises - Seated Wrist Extension Stretch  - 1 x daily - 7 x weekly - 2 reps - 30 sec hold - Standing Tricep Extensions with Resistance  - 1 x daily - 7 x weekly - 2 sets - 10 reps - red band hold - Wrist Prayer Stretch  - 1 x daily - 7 x weekly - 2 reps - 20 sec hold - Seated Scapular Retraction  - 1 x daily - 7 x weekly - 2 sets - 10 reps - Standing Ulnar Nerve Glide  - 1 x daily -  7 x weekly - 2 sets - 10 reps  ASSESSMENT:  CLINICAL IMPRESSION: Patient is a 32 y.o. female who was seen today for physical therapy evaluation and treatment for right elbow pain.  Patient had PT at another clinic in the past, but recently had a right ganglion cyst removed and is continuing to have some right medial elbow pain.  When performing shoulder testing, some pain in shoulder.  Pt asked about shoulder pain and she states "I just have normal shoulder pain."  Patient  with tenderness to palpation along right shoulder region as well as right medial elbow.  Patient with muscle weakness, muscle spasms, increased shoulder and elbow pain, and abnormal posture.  Patient would benefit from skilled PT to allow her to address her functional impairments to allow her to perform her desired tasks without increased pain.    OBJECTIVE IMPAIRMENTS: decreased strength, impaired perceived functional ability, increased muscle spasms, impaired sensation, impaired UE functional use, postural dysfunction, and pain.   ACTIVITY LIMITATIONS: carrying, lifting, and sleeping  PARTICIPATION LIMITATIONS: cleaning, laundry, shopping, community activity, and occupation  PERSONAL FACTORS: Past/current experiences, Time since onset of injury/illness/exacerbation, and 1-2 comorbidities: Right wrist ganglion cyst removal in July 2024, right medial epicondylitis  are also affecting patient's functional outcome.   REHAB POTENTIAL: Good  CLINICAL DECISION MAKING: Evolving/moderate complexity  EVALUATION COMPLEXITY: Moderate  GOALS: Goals reviewed with patient? Yes  SHORT TERM GOALS: Target date: 12/26/2022  Pt will be independent with initial HEP. Baseline: Goal status: INITIAL  2.  Pt will report at least a 25% improvement in symptoms. Baseline:  Goal status: INITIAL   LONG TERM GOALS: Target date: 01/30/2023  Pt will be independent with advanced HEP. Baseline:  Goal status: INITIAL  2.  Pt will increase right grip strength to at least 30 pounds to allow her to perform job related tasks. Baseline: 20 pounds Goal status: INITIAL  3.  Pt will increase right UE strength to at least 4+/5 to allow her to perform job related tasks with increased ease. Baseline:  Goal status: INITIAL  4.  Patient will report no increase in pain with playing video games and with drawing. Baseline:  Goal status: INITIAL   PLAN: PT FREQUENCY: 2x/week  PT DURATION: 8 weeks  PLANNED  INTERVENTIONS: Therapeutic exercises, Therapeutic activity, Neuromuscular re-education, Balance training, Gait training, Patient/Family education, Self Care, Joint mobilization, Joint manipulation, Aquatic Therapy, Dry Needling, Electrical stimulation, Spinal manipulation, Spinal mobilization, Cryotherapy, Moist heat, Taping, Vasopneumatic device, Traction, Ultrasound, Ionotophoresis 4mg /ml Dexamethasone, Manual therapy, and Re-evaluation  PLAN FOR NEXT SESSION: Assess and progress HEP as indicated, dry needling/manual therapy as indicated, strengthening   Sheryn Aldaz, PT 12/04/2022, 1:40 PM   [facility] [address], [address] [place], [place] [address] Phone # [phone] Fax [phone]

## 2022-12-09 ENCOUNTER — Ambulatory Visit: Payer: 59 | Admitting: Rehabilitative and Restorative Service Providers"

## 2022-12-09 ENCOUNTER — Encounter: Payer: Self-pay | Admitting: Physical Therapy

## 2022-12-09 ENCOUNTER — Ambulatory Visit: Payer: 59 | Attending: Sports Medicine | Admitting: Physical Therapy

## 2022-12-09 DIAGNOSIS — M7701 Medial epicondylitis, right elbow: Secondary | ICD-10-CM | POA: Diagnosis present

## 2022-12-09 DIAGNOSIS — M25521 Pain in right elbow: Secondary | ICD-10-CM | POA: Diagnosis present

## 2022-12-09 DIAGNOSIS — R252 Cramp and spasm: Secondary | ICD-10-CM | POA: Insufficient documentation

## 2022-12-09 DIAGNOSIS — M6281 Muscle weakness (generalized): Secondary | ICD-10-CM | POA: Insufficient documentation

## 2022-12-09 NOTE — Therapy (Signed)
OUTPATIENT PHYSICAL THERAPY UPPER EXTREMITY EVALUATION   Patient Name: Natalie Leblanc MRN: [id number] DOB:1991/03/13, 32 y.o., female Today's Date: 12/09/2022  END OF SESSION:  PT End of Session - 12/09/22 1228     Visit Number 2    Authorization Type U[employer] - [employer] approved 8 visits 12/04/2022-02/02/2023    Authorization - Visit Number 1    Authorization - Number of Visits 8    PT Start Time 1229    PT Stop Time 1310    PT Time Calculation (min) 41 min    Activity Tolerance Patient tolerated treatment well    Behavior During Therapy Natalie Leblanc for tasks assessed/performed             Past Medical History:  Diagnosis Date   COVID    Depression    PTSD (post-traumatic stress disorder)    Past Surgical History:  Procedure Laterality Date   BREAST LUMPECTOMY Left    benign per patient   Patient Active Problem List   Diagnosis Date Noted   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 12/28/2019   Vitamin D deficiency 12/23/2019   Depression    PTSD (post-traumatic stress disorder)     PCP: Natalie Leblanc, Natalie Patricia, MD  REFERRING PROVIDER: Richardean Sale, DO  REFERRING DIAG: [phone] (ICD-10-CM) - Right elbow pain  THERAPY DIAG:  Pain in right elbow  Muscle weakness  Medial epicondylitis of elbow, right  Cramp and spasm  Rationale for Evaluation and Treatment: Rehabilitation  ONSET DATE: Last September (but had this injury in the past before)  SUBJECTIVE:                                                                                                                                                                                      SUBJECTIVE STATEMENT: Pt reports that she had this pain in the past and it did get better, however, she flared up her injury at work and her right elbow has hurt for nearly a year.  Pt does report that the PT that she had in the past did help some with pain.  Pt had a recent ganglion cyst removed from her right wrist  and that helped with her right wrist pain, but her elbow pain persists.  Hand dominance: Right  PERTINENT HISTORY: S/P Right Ganglion Cyst removal on 10/16/2022, PTSD  PAIN:  Are you having pain? Yes: NPRS scale: 8/10 Pain location: right medial elbow Pain description: sharp Aggravating factors: movement, pushing, reaching Relieving factors: medication, PT helped in the past  PRECAUTIONS: Other: 20 pound lifting restriction per Natalie Leblanc note  RED FLAGS: None   WEIGHT BEARING RESTRICTIONS: No  FALLS:  Has patient fallen in last 6 months? Yes. 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  NEXT MD VISIT: Natalie Leblanc 02/02/2023  OBJECTIVE:   DIAGNOSTIC FINDINGS:  Right Elbow MRI on 06/03/2022: IMPRESSION: 1. No specific internal derangement is identified to explain the patient's symptoms.  PATIENT SURVEYS :  Eval:  Quick Dash 52.27  COGNITION: Overall cognitive status: Within functional limits for tasks assessed     SENSATION: Pt reports that she has nerve pain "like when you hit your funny bone" at some points  POSTURE: Rounded shoulders, forward flexed  UPPER EXTREMITY ROM:   Eval:  Pt has ROM WFL, however, has pain with right elbow flexion/extension and supination/pronation  UPPER EXTREMITY MMT:  Eval:   Right shoulder and elbow strength of 4/5 Left shoulder/UE strength is 4+ to 5/5 grossly throughout Right grip strength of 20 lbs Left grip strength of 25  lbs  SHOULDER SPECIAL TESTS: Impingement tests: Hawkins/Kennedy impingement test: positive  reports pain in right elbow and shoulder Rotator cuff assessment: Empty can test: positive  reports shoulder pain on right   PALPATION:  Tender to palpation around right shoulder region and biceps tendon insertion. Tenderness with muscle spasms noted in right medial elbow region   TODAY'S TREATMENT:                                                                                                                                         12/09/22: UBE L1 30 sec each way - medial elbow pain Standing ulnar nerve glide Wrist prayer stretch x30" Rt wrist ext stretch x30" Standing red tband Rt UE x 10 each: elbow extension, shoulder extension, IR, ER Seated Rt wrist 1lb x 10 each: extension/flexion, pron/sup, radial/ulnar deviation Lower trap lift offs from wall x 10 Green loop scapular stab against wall 2-way (horiz abd and upward diagonal) x 8 each Standing Rt UE bicep curl 2x10 red tband Ionto patch #1 placed to medial Rt elbow - removed while Pt still present at session as she became nauseated and vomited and attributed it to the patch PT monitored Pt until stomach was settled after removing patch  DATE: 12/04/2022 Issued HEP (see below) If treatment provided at initial evaluation, no treatment charged due to lack of authorization.       PATIENT EDUCATION: Education details: Issued HEP Person educated: Patient Education method: Explanation, Demonstration, and Handouts Education comprehension: verbalized understanding and returned demonstration  HOME EXERCISE PROGRAM: Access Code: [email or web address] URL: [email or web address] Date: 12/04/2022 Prepared by: Natalie Leblanc  Exercises - Seated Wrist Extension Stretch  - 1 x daily - 7 x weekly - 2 reps - 30 sec hold - Standing Tricep Extensions with Resistance  - 1 x daily - 7 x weekly - 2 sets - 10 reps - red band hold - Wrist Prayer Stretch   - 1 x daily - 7 x weekly - 2 reps - 20 sec hold - Seated Scapular Retraction  - 1 x daily - 7 x weekly - 2 sets - 10 reps - Standing Ulnar Nerve Glide  - 1 x daily - 7 x weekly - 2 sets - 10 reps  ASSESSMENT:  CLINICAL IMPRESSION: Malarie arrived with low grade elbow pain and was able to participate in shoulder, scapular, elbow and wrist strength today with some increased medial elbow pain end of session.  PT attempted placement of ionto patch #1 to medial elbow but Pt became nauseated and vomited and attributed this to the patch so we removed it and will d/c that from POC.  Will try ice end of session next time.     OBJECTIVE IMPAIRMENTS: decreased strength, impaired perceived functional ability, increased muscle spasms, impaired sensation, impaired UE functional use, postural dysfunction, and pain.   ACTIVITY LIMITATIONS: carrying, lifting, and sleeping  PARTICIPATION LIMITATIONS: cleaning, laundry, shopping, community activity, and occupation  PERSONAL FACTORS: Past/current experiences, Time since onset of injury/illness/exacerbation, and 1-2 comorbidities: Right wrist ganglion cyst removal in July 2024, right medial epicondylitis  are also affecting patient's functional outcome.   REHAB POTENTIAL: Good  CLINICAL DECISION MAKING: Evolving/moderate complexity  EVALUATION COMPLEXITY: Moderate  GOALS: Goals reviewed with patient? Yes  SHORT TERM GOALS: Target date: 12/26/2022  Pt will be independent with initial HEP. Baseline: Goal status: INITIAL  2.  Pt will report at least a 25% improvement in symptoms. Baseline:  Goal status: INITIAL   LONG TERM GOALS: Target date: 01/30/2023  Pt will be independent with advanced HEP. Baseline:  Goal status: INITIAL  2.  Pt will increase right grip strength to at least 30 pounds to allow her to perform job related tasks. Baseline: 20 pounds Goal status: INITIAL  3.  Pt will increase right UE strength to at least 4+/5 to allow her to  perform job related tasks with increased ease. Baseline:  Goal status: INITIAL  4.  Patient will report no increase in pain with playing video games and with drawing. Baseline:  Goal status: INITIAL   PLAN: PT FREQUENCY: 2x/week  PT DURATION: 8 weeks  PLANNED INTERVENTIONS: Therapeutic exercises, Therapeutic activity, Neuromuscular re-education, Balance training, Gait training, Patient/Family education, Self Care, Joint mobilization, Joint manipulation, Aquatic Therapy, Dry Needling, Electrical stimulation, Spinal manipulation, Spinal mobilization, Cryotherapy, Moist heat, Taping, Vasopneumatic device, Traction, Ultrasound, Ionotophoresis 4mg /ml Dexamethasone, Manual therapy, and Re-evaluation  PLAN FOR NEXT SESSION: Assess and progress HEP as indicated, dry needling/manual therapy as indicated, strengthening, NO IONTO - makes patient nauseated   Morton Peters, PT 12/09/22 1:18 PM   [facility] [address] [place], [place] [address] Phone # [phone] Fax [phone]

## 2022-12-23 ENCOUNTER — Ambulatory Visit: Payer: 59 | Admitting: Physical Therapy

## 2022-12-23 ENCOUNTER — Encounter: Payer: Self-pay | Admitting: Physical Therapy

## 2022-12-23 DIAGNOSIS — M25521 Pain in right elbow: Secondary | ICD-10-CM | POA: Diagnosis not present

## 2022-12-23 DIAGNOSIS — M6281 Muscle weakness (generalized): Secondary | ICD-10-CM

## 2022-12-23 DIAGNOSIS — R252 Cramp and spasm: Secondary | ICD-10-CM

## 2022-12-23 DIAGNOSIS — M7701 Medial epicondylitis, right elbow: Secondary | ICD-10-CM

## 2022-12-23 NOTE — Therapy (Signed)
OUTPATIENT PHYSICAL THERAPY UPPER EXTREMITY EVALUATION   Patient Name: Natalie Leblanc MRN: [id number] DOB:02/06/1991, 32 y.o., female Today's Date: 12/23/2022  END OF SESSION:  PT End of Session - 12/23/22 1141     Visit Number 3    Authorization Type [employer] - [employer] approved 8 visits 12/04/2022-02/02/2023    Authorization - Visit Number 2    Authorization - Number of Visits 8    PT Start Time 1106    PT Stop Time 1140    PT Time Calculation (min) 34 min    Activity Tolerance Patient tolerated treatment well    Behavior During Therapy [facility] for tasks assessed/performed             Past Medical History:  Diagnosis Date   COVID    Depression    PTSD (post-traumatic stress disorder)    Past Surgical History:  Procedure Laterality Date   BREAST LUMPECTOMY Left    benign per patient   Patient Active Problem List   Diagnosis Date Noted   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 12/28/2019   Vitamin D deficiency 12/23/2019   Depression    PTSD (post-traumatic stress disorder)     PCP: Philip Aspen, Limmie Patricia, MD  REFERRING PROVIDER: Richardean Sale, DO  REFERRING DIAG: [phone] (ICD-10-CM) - Right elbow pain  THERAPY DIAG:  Pain in right elbow  Muscle weakness  Medial epicondylitis of elbow, right  Cramp and spasm  Rationale for Evaluation and Treatment: Rehabilitation  ONSET DATE: Last September (but had this injury in the past before)  SUBJECTIVE:                                                                                                                                                                                      SUBJECTIVE STATEMENT: Pt reports she is doing okay after last treatment session. She did not have any lingering nausea after she left the clinic. Right now her shoulder is hurting more than her elbow. Her elbow pain is just a dull ache  Hand dominance: Right  PERTINENT HISTORY: S/P Right Ganglion Cyst removal on  10/16/2022, PTSD  PAIN:  Are you having pain? Yes: NPRS scale: 8/10 Pain location: right medial elbow Pain description: sharp Aggravating factors: movement, pushing, reaching Relieving factors: medication, PT helped in the past  PRECAUTIONS: Other: 20 pound lifting restriction per Dr Edison Pace note  RED FLAGS: None   WEIGHT BEARING RESTRICTIONS: No  FALLS:  Has patient fallen in last 6 months? Yes. 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  NEXT MD VISIT: Dr Jean Rosenthal 02/02/2023  OBJECTIVE:   DIAGNOSTIC FINDINGS:  Right Elbow MRI on 06/03/2022: IMPRESSION: 1. No specific internal derangement is identified to explain the patient's symptoms.  PATIENT SURVEYS :  Eval:  Quick Dash 52.27  COGNITION: Overall cognitive status: Within functional limits for tasks assessed     SENSATION: Pt reports that she has nerve pain "like when you hit your funny bone" at some points  POSTURE: Rounded shoulders, forward flexed  UPPER EXTREMITY ROM:   Eval:  Pt has ROM WFL, however, has pain with right elbow flexion/extension and supination/pronation  UPPER EXTREMITY MMT:  Eval:   Right shoulder and elbow strength of 4/5 Left shoulder/UE strength is 4+ to 5/5 grossly throughout Right grip strength of 20 lbs Left grip strength of 25 lbs  SHOULDER SPECIAL TESTS: Impingement tests: Hawkins/Kennedy impingement test: positive  reports pain in right elbow and shoulder Rotator cuff assessment:  Empty can test: positive  reports shoulder pain on right   PALPATION:  Tender to palpation around right shoulder region and biceps tendon insertion. Tenderness with muscle spasms noted in right medial elbow region   TODAY'S TREATMENT:                                                                                                                                         12/23/22: Standing ulnar nerve glide Wrist prayer stretch x30" Rt wrist ext stretch x30" Standing red tband Rt UE x 10 each: elbow extension, shoulder extension, ER; increased anterior shoulder pain w/ all exercises Supine Chest Press 2# x15 Supine shoulder flexion w/ cane 2#  x15 Supine shoulder diagonal w/ cane 2# x15 each way Skull crushers w/ cane 2# x15 3 way shoulder stabilization w/ green loop 2x5 each way Manual: PT performed ulnar nerve glides Ball on the wall circles clockwise/counterclockwise x10 each way Ball on the wall 4way x10 each Drop catches w/ blue ball x15 Seated Rt wrist 2lb x 10 each: extension/flexion, pron/sup, radial/ulnar deviation Lower trap lift offs from wall x 10 Standing Rt UE bicep curl 2x10 red tband  12/09/22: UBE L1 30 sec each way - medial elbow pain Standing ulnar nerve glide Wrist prayer stretch x30" Rt wrist ext stretch x30" Standing red tband Rt UE x 10 each: elbow extension, shoulder extension, IR, ER Seated Rt wrist 1lb x 10 each: extension/flexion, pron/sup, radial/ulnar deviation Lower trap lift offs from wall x 10 Green loop scapular stab against wall 2-way (horiz abd and upward diagonal) x 8 each Standing Rt UE bicep curl  2x10 red tband Ionto patch #1 placed to medial Rt elbow - removed while Pt still present at session as she became nauseated and vomited and attributed it to the patch PT monitored Pt until stomach was settled after removing patch  DATE: 12/04/2022 Issued HEP (see below) If treatment provided at initial evaluation, no treatment charged due to lack of  authorization.       PATIENT EDUCATION: Education details: Issued HEP Person educated: Patient Education method: Explanation, Demonstration, and Handouts Education comprehension: verbalized understanding and returned demonstration  HOME EXERCISE PROGRAM: Access Code: [email or web address] URL: [email or web address] Date: 12/04/2022 Prepared by: Clydie Braun Menke  Exercises - Seated Wrist Extension Stretch  - 1 x daily - 7 x weekly - 2 reps - 30 sec hold - Standing Tricep Extensions with Resistance  - 1 x daily - 7 x weekly - 2 sets - 10 reps - red band hold - Wrist Prayer Stretch  - 1 x daily - 7 x weekly - 2 reps - 20 sec hold - Seated Scapular Retraction  - 1 x daily - 7 x weekly - 2 sets - 10 reps - Standing Ulnar Nerve Glide  - 1 x daily - 7 x weekly - 2 sets - 10 reps  ASSESSMENT:  CLINICAL IMPRESSION: Today's treatment session focused on gentle UE strengthening and flexibility. Reviewed patient's HEP exercises for proper form and tolerance. Patient experienced increased shoulder pain with theraband strengthening exercises. Patient was able to tolerate supine strengthening exercises with some modifications. With 3 way shoulder stabilization exercise patient required verbal and tactile cues for correct shoulder alignment. With ball on the wall exercises patient required frequent rest breaks due to muscle fatigue. Progressed patient's weight with seated wrist exercises. At end of treatment session patient verbalized her shoulder was not as painful but her elbow was a little more irritated. Patient will continue to benefit from skilled therapy to address current impairments and functional limitations listed below.  OBJECTIVE IMPAIRMENTS: decreased strength, impaired perceived functional ability, increased muscle spasms, impaired sensation, impaired UE functional use, postural dysfunction, and pain.   ACTIVITY LIMITATIONS: carrying, lifting, and sleeping  PARTICIPATION LIMITATIONS:  cleaning, laundry, shopping, community activity, and occupation  PERSONAL FACTORS: Past/current experiences, Time since onset of injury/illness/exacerbation, and 1-2 comorbidities: Right wrist ganglion cyst removal in July 2024, right medial epicondylitis  are also affecting patient's functional outcome.   REHAB POTENTIAL: Good  CLINICAL DECISION MAKING: Evolving/moderate complexity  EVALUATION COMPLEXITY: Moderate  GOALS: Goals reviewed with patient? Yes  SHORT TERM GOALS: Target date: 12/26/2022  Pt will be independent with initial HEP. Baseline: Goal status: INITIAL  2.  Pt will report at least a 25% improvement in symptoms. Baseline:  Goal status: INITIAL   LONG TERM GOALS: Target date: 01/30/2023  Pt will be independent with advanced HEP. Baseline:  Goal status: INITIAL  2.  Pt will increase right grip strength to at least 30 pounds to allow her to perform job related tasks. Baseline: 20 pounds Goal status: INITIAL  3.  Pt will increase right UE strength to at least 4+/5 to allow her to perform job related tasks with increased ease. Baseline:  Goal status: INITIAL  4.  Patient will report no increase in pain with playing video games and with drawing. Baseline:  Goal status: INITIAL   PLAN: PT FREQUENCY: 2x/week  PT DURATION: 8 weeks  PLANNED INTERVENTIONS: Therapeutic exercises, Therapeutic activity, Neuromuscular re-education, Balance training, Gait training, Patient/Family education, Self Care, Joint mobilization, Joint manipulation, Aquatic Therapy, Dry Needling,  Electrical stimulation, Spinal manipulation, Spinal mobilization, Cryotherapy, Moist heat, Taping, Vasopneumatic device, Traction, Ultrasound, Ionotophoresis 4mg /ml Dexamethasone, Manual therapy, and Re-evaluation  PLAN FOR NEXT SESSION: Pt would like to try dry needling (has had in past at another clinic for same problem and it helped), manual therapy as indicated, strengthening, NO IONTO - makes  patient nauseated   Morton Peters, PT 12/23/22 11:45 AM  Co-treated with Claude Manges, PT   [facility] [address], [address] [place], [place] [address] Phone # [phone] Fax [phone]

## 2022-12-30 ENCOUNTER — Ambulatory Visit: Payer: 59 | Admitting: Rehabilitative and Restorative Service Providers"

## 2022-12-30 ENCOUNTER — Encounter: Payer: Self-pay | Admitting: Rehabilitative and Restorative Service Providers"

## 2022-12-30 DIAGNOSIS — R252 Cramp and spasm: Secondary | ICD-10-CM

## 2022-12-30 DIAGNOSIS — M25521 Pain in right elbow: Secondary | ICD-10-CM

## 2022-12-30 DIAGNOSIS — M6281 Muscle weakness (generalized): Secondary | ICD-10-CM

## 2022-12-30 DIAGNOSIS — M7701 Medial epicondylitis, right elbow: Secondary | ICD-10-CM

## 2022-12-30 NOTE — Patient Instructions (Signed)

## 2022-12-30 NOTE — Therapy (Signed)
OUTPATIENT PHYSICAL THERAPY TREATMENT NOTE   Patient Name: Natalie Leblanc MRN: [id number] DOB:07/12/1990, 32 y.o., female Today's Date: 12/30/2022  END OF SESSION:  PT End of Session - 12/30/22 1102     Visit Number 4    Date for PT Re-Evaluation 01/30/23    Authorization Type [employer] - [employer] approved 8 visits 12/04/2022-02/02/2023    Authorization - Visit Number 3    Authorization - Number of Visits 8    PT Start Time 1101    PT Stop Time 1142    PT Time Calculation (min) 41 min    Activity Tolerance Patient tolerated treatment well    Behavior During Therapy [facility] for tasks assessed/performed             Past Medical History:  Diagnosis Date   COVID    Depression    PTSD (post-traumatic stress disorder)    Past Surgical History:  Procedure Laterality Date   BREAST LUMPECTOMY Left    benign per patient   Patient Active Problem List   Diagnosis Date Noted   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 12/28/2019   Vitamin D deficiency 12/23/2019   Depression    PTSD (post-traumatic stress disorder)     PCP: Philip Aspen, Limmie Patricia, MD  REFERRING PROVIDER: Richardean Sale, DO  REFERRING DIAG: [phone] (ICD-10-CM) - Right elbow pain  THERAPY DIAG:  Pain in right elbow  Muscle weakness  Medial epicondylitis of elbow, right  Cramp and spasm  Rationale for Evaluation and Treatment: Rehabilitation  ONSET DATE: Last September (but had this injury in the past before)  SUBJECTIVE:                                                                                                                                                                                      SUBJECTIVE STATEMENT: Pt reports she has has some increased pain over the past week.  Hand dominance: Right  PERTINENT HISTORY: S/P Right Ganglion Cyst removal on 10/16/2022, PTSD  PAIN:  Are you having pain? Yes: NPRS scale: 6/10 Pain location: right medial elbow Pain description:  sharp Aggravating factors: movement, pushing, reaching Relieving factors: medication, PT helped in the past  PRECAUTIONS: Other: 20 pound lifting restriction per Dr Edison Pace note  RED FLAGS: None   WEIGHT BEARING RESTRICTIONS: No  FALLS:  Has patient fallen in last 6 months? Yes. 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  NEXT MD VISIT: Dr Jean Rosenthal 02/02/2023  OBJECTIVE:   DIAGNOSTIC FINDINGS:  Right Elbow MRI on 06/03/2022: IMPRESSION: 1. No specific internal derangement is identified to explain the patient's symptoms.  PATIENT SURVEYS :  Eval:  Quick Dash 52.27  COGNITION: Overall cognitive status: Within functional limits for tasks assessed     SENSATION: Pt reports that she has nerve pain "like when you hit your funny bone" at some points  POSTURE: Rounded shoulders, forward flexed  UPPER EXTREMITY ROM:   Eval:  Pt has ROM WFL, however, has pain with right elbow flexion/extension and supination/pronation  UPPER EXTREMITY MMT:  Eval:   Right shoulder and elbow strength of 4/5 Left shoulder/UE strength is 4+ to 5/5 grossly throughout Right grip strength of 20 lbs Left grip strength of 25 lbs  SHOULDER SPECIAL TESTS: Impingement tests: Hawkins/Kennedy impingement test: positive  reports pain in right elbow and shoulder Rotator cuff assessment: Empty can test: positive  reports shoulder pain on right   PALPATION:  Tender to palpation around right shoulder region  and biceps tendon insertion. Tenderness with muscle spasms noted in right medial elbow region   TODAY'S TREATMENT:                                                                                                                                          Date:  12/30/2022 UBE level 1.0 x3 min each direction with PT present to discuss status Standing rows with red tband 2x10 Standing shoulder extension with red tband 2x10 Standing shoulder ER with red tband 2x10 Standing shoulder horizontal abduction with red tband 2x10 Standing 4D scapular stabilization with 2# light blue plyoball on wall x20 each bilat Standing 3 way scapular stabilization with green loop 2x5 each bilat Standing facing wall lower trap lift offs 2x10 Supine cervical retraction 2x10 Supine shoulder press 2x10 Supine serratus punch with 1# 2x10 bilat Supine hand tracing of alphabet with 1# A-Z bilat Trigger Point Dry-Needling  Treatment instructions: Expect mild to moderate muscle soreness. S/S of pneumothorax if dry needled over a lung field, and to seek immediate medical attention should they occur. Patient verbalized understanding of these instructions and education. Patient Consent Given: Yes Education handout provided: Yes Muscles treated: Right cervical multifidi, right suboccipitals, right upper trap, and right delts Electrical stimulation performed: No Parameters: N/A Treatment response/outcome: Utilized skilled palpation to identify bony landmarks and trigger points.  Able to illicit twitch response and muscle elongation.  Soft tissue mobilization following to further promote tissue elongation to bilat cervical paraspinals and right upper arm.     Date:  12/23/22 Standing ulnar nerve glide Wrist prayer stretch x30" Rt wrist  ext stretch x30" Standing red tband Rt UE x 10 each: elbow extension, shoulder extension, ER; increased anterior shoulder pain w/ all exercises Supine Chest Press 2# x15 Supine shoulder flexion  w/ cane 2#  x15 Supine shoulder diagonal w/ cane 2# x15 each way Skull crushers w/ cane 2# x15 3 way shoulder stabilization w/ green loop 2x5 each way Manual: PT performed ulnar nerve glides Ball on the wall circles clockwise/counterclockwise x10 each way Ball on the wall 4way x10 each Drop catches w/ blue ball x15 Seated Rt wrist 2lb x 10 each: extension/flexion, pron/sup, radial/ulnar deviation Lower trap lift offs from wall x 10 Standing Rt UE bicep curl 2x10 red tband  12/09/22: UBE L1 30 sec each way - medial elbow pain Standing ulnar nerve glide Wrist prayer stretch x30" Rt wrist ext stretch x30" Standing red tband Rt UE x 10 each: elbow extension, shoulder extension, IR, ER Seated Rt wrist 1lb x 10 each: extension/flexion, pron/sup, radial/ulnar deviation Lower trap lift offs from wall x 10 Green loop scapular stab against wall 2-way (horiz abd and upward diagonal) x 8 each Standing Rt UE bicep curl 2x10 red tband Ionto patch #1 placed to medial Rt elbow - removed while Pt still present at session as she became nauseated and vomited and attributed it to the patch PT monitored Pt until stomach was settled after removing patch     PATIENT EDUCATION: Education details: Issued HEP Person educated: Patient Education method: Explanation, Demonstration, and Handouts Education comprehension: verbalized understanding and returned demonstration  HOME EXERCISE PROGRAM: Access Code: [email or web address] URL: [email or web address] Date: 12/04/2022 Prepared by: Clydie Braun Francille Wittmann  Exercises - Seated Wrist Extension Stretch  - 1 x daily - 7 x weekly - 2 reps - 30 sec hold - Standing Tricep Extensions with Resistance  - 1 x daily - 7 x weekly - 2 sets - 10 reps - red band hold - Wrist Prayer Stretch  - 1 x daily - 7 x weekly - 2 reps - 20 sec hold - Seated Scapular Retraction  - 1 x daily - 7 x weekly - 2 sets - 10 reps - Standing Ulnar Nerve Glide  - 1 x daily - 7 x weekly - 2 sets -  10 reps  ASSESSMENT:  CLINICAL IMPRESSION: Amika presents to skilled PT reporting continued pain.  Patient states that she has had dry needling in the past and is agreeable to trying this again.  Patient states that she has shoulder pain and elbow pain.  Patient with good twitch response noted with dry needling and performed manual therapy following to further promote tissue elongation.  Patient reported less pain in her right arm when she performed bed mobility at end of session.  Patient with reports of overall less pain at end of session.  OBJECTIVE IMPAIRMENTS: decreased strength, impaired perceived functional ability, increased muscle spasms, impaired sensation, impaired UE functional use, postural dysfunction, and pain.   ACTIVITY LIMITATIONS: carrying, lifting, and sleeping  PARTICIPATION LIMITATIONS: cleaning, laundry, shopping, community activity, and occupation  PERSONAL FACTORS: Past/current experiences, Time since onset of injury/illness/exacerbation, and 1-2 comorbidities: Right wrist ganglion cyst removal in July 2024, right medial epicondylitis  are also affecting patient's functional outcome.   REHAB POTENTIAL: Good  CLINICAL DECISION MAKING: Evolving/moderate complexity  EVALUATION COMPLEXITY: Moderate  GOALS: Goals reviewed with patient? Yes  SHORT TERM GOALS: Target date: 12/26/2022  Pt will be independent with initial HEP. Baseline: Goal status: MET  2.  Pt will report at least a 25%  improvement in symptoms. Baseline:  Goal status: IN PROGRESS   LONG TERM GOALS: Target date: 01/30/2023  Pt will be independent with advanced HEP. Baseline:  Goal status: INITIAL  2.  Pt will increase right grip strength to at least 30 pounds to allow her to perform job related tasks. Baseline: 20 pounds Goal status: INITIAL  3.  Pt will increase right UE strength to at least 4+/5 to allow her to perform job related tasks with increased ease. Baseline:  Goal status:  INITIAL  4.  Patient will report no increase in pain with playing video games and with drawing. Baseline:  Goal status: INITIAL   PLAN: PT FREQUENCY: 2x/week  PT DURATION: 8 weeks  PLANNED INTERVENTIONS: Therapeutic exercises, Therapeutic activity, Neuromuscular re-education, Balance training, Gait training, Patient/Family education, Self Care, Joint mobilization, Joint manipulation, Aquatic Therapy, Dry Needling, Electrical stimulation, Spinal manipulation, Spinal mobilization, Cryotherapy, Moist heat, Taping, Vasopneumatic device, Traction, Ultrasound, Ionotophoresis 4mg /ml Dexamethasone, Manual therapy, and Re-evaluation  PLAN FOR NEXT SESSION: Assess response to dry needling, manual therapy as indicated, strengthening, NO IONTO - makes patient nauseated   Reather Laurence, PT, DPT 12/30/22, 12:26 PM  [facility] [address], [address] [place], [place] [address] Phone # [phone] Fax [phone]

## 2023-01-06 ENCOUNTER — Ambulatory Visit: Payer: 59 | Attending: Sports Medicine | Admitting: Rehabilitative and Restorative Service Providers"

## 2023-01-06 ENCOUNTER — Encounter: Payer: Self-pay | Admitting: Rehabilitative and Restorative Service Providers"

## 2023-01-06 DIAGNOSIS — M6281 Muscle weakness (generalized): Secondary | ICD-10-CM | POA: Insufficient documentation

## 2023-01-06 DIAGNOSIS — M25521 Pain in right elbow: Secondary | ICD-10-CM | POA: Insufficient documentation

## 2023-01-06 DIAGNOSIS — M7701 Medial epicondylitis, right elbow: Secondary | ICD-10-CM | POA: Insufficient documentation

## 2023-01-06 DIAGNOSIS — R252 Cramp and spasm: Secondary | ICD-10-CM | POA: Diagnosis present

## 2023-01-06 NOTE — Therapy (Signed)
OUTPATIENT PHYSICAL THERAPY TREATMENT NOTE   Patient Name: Natalie Leblanc MRN: [id number] DOB:12-28-1990, 32 y.o., female Today's Date: 01/06/2023  END OF SESSION:  PT End of Session - 01/06/23 1151     Visit Number 5    Date for PT Re-Evaluation 01/30/23    Authorization Type [employer] - [employer] approved 8 visits 12/04/2022-02/02/2023    Authorization - Visit Number 4    Authorization - Number of Visits 8    PT Start Time 1149    PT Stop Time 1230    PT Time Calculation (min) 41 min    Activity Tolerance Patient tolerated treatment well    Behavior During Therapy [facility] for tasks assessed/performed             Past Medical History:  Diagnosis Date   COVID    Depression    PTSD (post-traumatic stress disorder)    Past Surgical History:  Procedure Laterality Date   BREAST LUMPECTOMY Left    benign per patient   Patient Active Problem List   Diagnosis Date Noted   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 12/28/2019   Vitamin D deficiency 12/23/2019   Depression    PTSD (post-traumatic stress disorder)     PCP: Philip Aspen, Limmie Patricia, MD  REFERRING PROVIDER: Richardean Sale, DO  REFERRING DIAG: [phone] (ICD-10-CM) - Right elbow pain  THERAPY DIAG:  Pain in right elbow  Muscle weakness  Medial epicondylitis of elbow, right  Cramp and spasm  Rationale for Evaluation and Treatment: Rehabilitation  ONSET DATE: Last September (but had this injury in the past before)  SUBJECTIVE:                                                                                                                                                                                      SUBJECTIVE STATEMENT: Pt reports that her shoulder felt better after dry needling last session.  Hand dominance: Right  PERTINENT HISTORY: S/P Right Ganglion Cyst removal on 10/16/2022, PTSD  PAIN:  Are you having pain? Yes: NPRS scale: 2-4/10 Pain location: right medial elbow Pain  description: sharp Aggravating factors: movement, pushing, reaching Relieving factors: medication, PT helped in the past  PRECAUTIONS: Other: 20 pound lifting restriction per Dr Edison Pace note  RED FLAGS: None   WEIGHT BEARING RESTRICTIONS: No  FALLS:  Has patient fallen in last 6 months? Yes. 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  NEXT MD VISIT: Dr Jean Rosenthal 02/02/2023  OBJECTIVE:   DIAGNOSTIC FINDINGS:  Right Elbow MRI on 06/03/2022: IMPRESSION: 1. No specific internal derangement is identified to explain the patient's symptoms.  PATIENT SURVEYS :  Eval:  Quick Dash 52.27  COGNITION: Overall cognitive status: Within functional limits for tasks assessed     SENSATION: Pt reports that she has nerve pain "like when you hit your funny bone" at some points  POSTURE: Rounded shoulders, forward flexed  UPPER EXTREMITY ROM:   Eval:  Pt has ROM WFL, however, has pain with right elbow flexion/extension and supination/pronation  UPPER EXTREMITY MMT:  Eval:   Right shoulder and elbow strength of 4/5 Left shoulder/UE strength is 4+ to 5/5 grossly throughout Right grip strength of 20 lbs Left grip strength of 25 lbs  SHOULDER SPECIAL TESTS: Impingement tests: Hawkins/Kennedy impingement test: positive  reports pain in right elbow and shoulder Rotator cuff assessment: Empty can test: positive  reports shoulder pain on right   PALPATION:  Tender to palpation around right  shoulder region and biceps tendon insertion. Tenderness with muscle spasms noted in right medial elbow region   TODAY'S TREATMENT:                                                                                                                                          Date:  01/06/2023 UBE level 1.0 x3 min each direction with PT present to discuss status Standing rows with red tband 2x10 Standing shoulder extension with red tband 2x10 Standing rows with red tband 2x10 Standing shoulder ER with red tband 2x10 Standing shoulder horizontal abduction with red tband 2x10 Standing 4D scapular stabilization with 2# light blue plyoball on wall x20 each bilat Trigger Point Dry-Needling  Treatment instructions: Expect mild to moderate muscle soreness. S/S of pneumothorax if dry needled over a lung field, and to seek immediate medical attention should they occur. Patient verbalized understanding of these instructions and education. Patient Consent Given: Yes Education handout provided: Yes Muscles treated: right upper trap, right rhomboids, right wrist extensors, and right delts Electrical stimulation performed: No Parameters: N/A Treatment response/outcome: Utilized skilled palpation to identify bony landmarks and trigger points.  Able to illicit twitch response and muscle elongation.  Soft tissue mobilization following to further promote tissue elongation to bilat cervical paraspinals and right upper arm.    Date:  12/30/2022 UBE level 1.0 x3 min each direction with PT present to discuss status Standing rows with red tband 2x10 Standing shoulder extension with red tband 2x10 Standing shoulder ER with red tband 2x10 Standing shoulder horizontal abduction with red tband 2x10 Standing 4D scapular stabilization with 2# light  blue plyoball on wall x20 each bilat Standing 3 way scapular stabilization with green loop 2x5 each bilat Standing facing wall lower trap lift offs 2x10 Supine cervical retraction  2x10 Supine shoulder press 2x10 Supine serratus punch with 1# 2x10 bilat Supine hand tracing of alphabet with 1# A-Z bilat Trigger Point Dry-Needling  Treatment instructions: Expect mild to moderate muscle soreness. S/S of pneumothorax if dry needled over a lung field, and to seek immediate medical attention should they occur. Patient verbalized understanding of these instructions and education. Patient Consent Given: Yes Education handout provided: Yes Muscles treated: Right cervical multifidi, right suboccipitals, right upper trap, and right delts Electrical stimulation performed: No Parameters: N/A Treatment response/outcome: Utilized skilled palpation to identify bony landmarks and trigger points.  Able to illicit twitch response and muscle elongation.  Soft tissue mobilization following to further promote tissue elongation to bilat cervical paraspinals and right upper arm.     Date:  12/23/22 Standing ulnar nerve glide Wrist prayer stretch x30" Rt wrist ext stretch x30" Standing red tband Rt UE x 10 each: elbow extension, shoulder extension, ER; increased anterior shoulder pain w/ all exercises Supine Chest Press 2# x15 Supine shoulder flexion w/ cane 2#  x15 Supine shoulder diagonal w/ cane 2# x15 each way Skull crushers w/ cane 2# x15 3 way shoulder stabilization w/ green loop 2x5 each way Manual: PT performed ulnar nerve glides Ball on the wall circles clockwise/counterclockwise x10 each way Ball on the wall 4way x10 each Drop catches w/ blue ball x15 Seated Rt wrist 2lb x 10 each: extension/flexion, pron/sup, radial/ulnar deviation Lower trap lift offs from wall x 10 Standing Rt UE bicep curl 2x10 red tband     PATIENT EDUCATION: Education details: Issued HEP Person educated: Patient Education method: Explanation, Demonstration, and Handouts Education comprehension: verbalized understanding and returned demonstration  HOME EXERCISE PROGRAM: Access Code:  [email or web address] URL: [email or web address] Date: 12/04/2022 Prepared by: Clydie Braun Meika Earll  Exercises - Seated Wrist Extension Stretch  - 1 x daily - 7 x weekly - 2 reps - 30 sec hold - Standing Tricep Extensions with Resistance  - 1 x daily - 7 x weekly - 2 sets - 10 reps - red band hold - Wrist Prayer Stretch  - 1 x daily - 7 x weekly - 2 reps - 20 sec hold - Seated Scapular Retraction  - 1 x daily - 7 x weekly - 2 sets - 10 reps - Standing Ulnar Nerve Glide  - 1 x daily - 7 x weekly - 2 sets - 10 reps  ASSESSMENT:  CLINICAL IMPRESSION: Leonie presents to skilled PT reporting that her shoulder did feel better after dry needling last session.  Added in wrist extensors for dry needling this session and able to illicit strong twitch responses.  Patient with does require occasional brief recovery periods throughout therex secondary to muscle fatigue.  Patient with good response to adding in continued scapular strengthening exercises.  Patient continues to require skilled PT to progress towards goal related activities.  OBJECTIVE IMPAIRMENTS: decreased strength, impaired perceived functional ability, increased muscle spasms, impaired sensation, impaired UE functional use, postural dysfunction, and pain.   ACTIVITY LIMITATIONS: carrying, lifting, and sleeping  PARTICIPATION LIMITATIONS: cleaning, laundry, shopping, community activity, and occupation  PERSONAL FACTORS: Past/current experiences, Time since onset of injury/illness/exacerbation, and 1-2 comorbidities: Right wrist ganglion cyst removal in July 2024, right medial epicondylitis  are also affecting patient's functional outcome.   REHAB POTENTIAL: Good  CLINICAL DECISION MAKING: Evolving/moderate complexity  EVALUATION COMPLEXITY: Moderate  GOALS: Goals reviewed with patient? Yes  SHORT TERM GOALS: Target date: 12/26/2022  Pt will be independent with initial HEP. Baseline: Goal status: MET  2.  Pt will report at least a  25% improvement in symptoms. Baseline:  Goal status: IN PROGRESS   LONG TERM GOALS: Target date: 01/30/2023  Pt will be independent with advanced HEP. Baseline:  Goal status: INITIAL  2.  Pt will increase right grip strength to at least 30 pounds to allow her to perform job related tasks. Baseline: 20 pounds Goal status: INITIAL  3.  Pt will increase right UE strength to at least 4+/5 to allow her to perform job related tasks with increased ease. Baseline:  Goal status: INITIAL  4.  Patient will report no increase in pain with playing video games and with drawing. Baseline:  Goal status: INITIAL   PLAN: PT FREQUENCY: 2x/week  PT DURATION: 8 weeks  PLANNED INTERVENTIONS: Therapeutic exercises, Therapeutic activity, Neuromuscular re-education, Balance training, Gait training, Patient/Family education, Self Care, Joint mobilization, Joint manipulation, Aquatic Therapy, Dry Needling, Electrical stimulation, Spinal manipulation, Spinal mobilization, Cryotherapy, Moist heat, Taping, Vasopneumatic device, Traction, Ultrasound, Ionotophoresis 4mg /ml Dexamethasone, Manual therapy, and Re-evaluation  PLAN FOR NEXT SESSION: Assess response to dry needling, manual therapy as indicated, strengthening, NO IONTO - makes patient nauseated   Reather Natalie, PT, DPT 01/06/23, 1:41 PM  [facility] [address], [address] [place], [place] [address] Phone # [phone] Fax [phone]

## 2023-01-12 ENCOUNTER — Ambulatory Visit: Payer: 59 | Admitting: Rehabilitative and Restorative Service Providers"

## 2023-01-12 ENCOUNTER — Encounter: Payer: Self-pay | Admitting: Rehabilitative and Restorative Service Providers"

## 2023-01-12 DIAGNOSIS — M25521 Pain in right elbow: Secondary | ICD-10-CM

## 2023-01-12 DIAGNOSIS — M7701 Medial epicondylitis, right elbow: Secondary | ICD-10-CM

## 2023-01-12 DIAGNOSIS — M6281 Muscle weakness (generalized): Secondary | ICD-10-CM

## 2023-01-12 DIAGNOSIS — R252 Cramp and spasm: Secondary | ICD-10-CM

## 2023-01-12 NOTE — Therapy (Signed)
OUTPATIENT PHYSICAL THERAPY TREATMENT NOTE   Patient Name: Natalie Leblanc MRN: [id number] DOB:April 26, 1990, 32 y.o., female Today's Date: 01/12/2023  END OF SESSION:  PT End of Session - 01/12/23 1233     Visit Number 6    Date for PT Re-Evaluation 01/30/23    Authorization Type [employer] - [employer] approved 8 visits 12/04/2022-02/02/2023    Authorization - Visit Number 5    Authorization - Number of Visits 8    PT Start Time 1231    PT Stop Time 1310    PT Time Calculation (min) 39 min    Activity Tolerance Patient tolerated treatment well    Behavior During Therapy [facility] for tasks assessed/performed             Past Medical History:  Diagnosis Date   COVID    Depression    PTSD (post-traumatic stress disorder)    Past Surgical History:  Procedure Laterality Date   BREAST LUMPECTOMY Left    benign per patient   Patient Active Problem List   Diagnosis Date Noted   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 12/28/2019   Vitamin D deficiency 12/23/2019   Depression    PTSD (post-traumatic stress disorder)     PCP: Philip Aspen, Limmie Patricia, MD  REFERRING PROVIDER: Richardean Sale, DO  REFERRING DIAG: [phone] (ICD-10-CM) - Right elbow pain  THERAPY DIAG:  Pain in right elbow  Muscle weakness  Medial epicondylitis of elbow, right  Cramp and spasm  Rationale for Evaluation and Treatment: Rehabilitation  ONSET DATE: Last September (but had this injury in the past before)  SUBJECTIVE:                                                                                                                                                                                      SUBJECTIVE STATEMENT: Pt states that she did not sleep well last night, states some increased pain with her elbow today.  Hand dominance: Right  PERTINENT HISTORY: S/P Right Ganglion Cyst removal on 10/16/2022, PTSD  PAIN:  Are you having pain? Yes: NPRS scale: 5/10 Pain location:  right medial elbow Pain description: sharp Aggravating factors: movement, pushing, reaching Relieving factors: medication, PT helped in the past  PRECAUTIONS: Other: 20 pound lifting restriction per Dr Edison Pace note  RED FLAGS: None   WEIGHT BEARING RESTRICTIONS: No  FALLS:  Has patient fallen in last 6 months? Yes. 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  NEXT MD VISIT: Dr Jean Rosenthal 02/02/2023  OBJECTIVE:   DIAGNOSTIC FINDINGS:  Right Elbow MRI on 06/03/2022: IMPRESSION: 1. No specific internal derangement is identified to explain the patient's symptoms.  PATIENT SURVEYS :  Eval:  [name] 52.27 01/12/2023:  Quick Dash 40.91   COGNITION: Overall cognitive status: Within functional limits for tasks assessed     SENSATION: Pt reports that she has nerve pain "like when you hit your funny bone" at some points  POSTURE: Rounded shoulders, forward flexed  UPPER EXTREMITY ROM:   Eval:  Pt has ROM WFL, however, has pain with right elbow flexion/extension and supination/pronation  UPPER EXTREMITY MMT:  Eval:   Right shoulder and elbow strength of 4/5 Left shoulder/UE strength is 4+ to 5/5 grossly throughout Right grip strength of 20 lbs Left grip strength of 25 lbs  SHOULDER SPECIAL TESTS: Impingement tests: Hawkins/Kennedy impingement test: positive  reports pain in right elbow and shoulder Rotator cuff assessment: Empty can test: positive  reports shoulder pain on  right   PALPATION:  Tender to palpation around right shoulder region and biceps tendon insertion. Tenderness with muscle spasms noted in right medial elbow region   TODAY'S TREATMENT:                                                                                                                                          Date:  01/12/2023 UBE level 1.2 x3 min each direction with PT present to discuss status Quick Dash 40.91  Seated grip strengthening with webbed orange disc 2x10 bilat Seated shoulder extension with red tband 2x10 Seated supination/pronation with 2# 2x10 bilat Seated shoulder flexion with 2# 2x10 bilat Seated shoulder abduction with 2# 2x10 bilat Seated shoulder 'punch' with 2# 2x10 bilat Manual therapy:  soft tissue mobilization to right forearm and elbow region and manual trigger point release to palpable trigger points. Seated prayer stretch 2x20 sec   Date:  01/06/2023 UBE level 1.0 x3 min each direction with PT present to discuss status Standing rows with red tband 2x10 Standing shoulder extension with red tband 2x10 Standing rows with red tband 2x10 Standing shoulder ER with red tband 2x10 Standing shoulder horizontal abduction with red tband 2x10 Standing 4D scapular stabilization with 2# light blue plyoball on wall x20 each bilat Trigger Point Dry-Needling  Treatment instructions: Expect mild to moderate muscle soreness. S/S of pneumothorax if dry needled over a lung field, and to seek immediate medical attention should they occur. Patient verbalized understanding of these instructions and education. Patient Consent Given: Yes Education handout provided: Yes Muscles treated: right upper trap, right rhomboids, right wrist  extensors, and right delts Electrical stimulation performed: No Parameters: N/A Treatment response/outcome: Utilized skilled palpation to identify bony landmarks and trigger points.  Able to illicit twitch response and muscle elongation.  Soft  tissue mobilization following to further promote tissue elongation to bilat cervical paraspinals and right upper arm.    Date:  12/30/2022 UBE level 1.0 x3 min each direction with PT present to discuss status Standing rows with red tband 2x10 Standing shoulder extension with red tband 2x10 Standing shoulder ER with red tband 2x10 Standing shoulder horizontal abduction with red tband 2x10 Standing 4D scapular stabilization with 2# light blue plyoball on wall x20 each bilat Standing 3 way scapular stabilization with green loop 2x5 each bilat Standing facing wall lower trap lift offs 2x10 Supine cervical retraction 2x10 Supine shoulder press 2x10 Supine serratus punch with 1# 2x10 bilat Supine hand tracing of alphabet with 1# A-Z bilat Trigger Point Dry-Needling  Treatment instructions: Expect mild to moderate muscle soreness. S/S of pneumothorax if dry needled over a lung field, and to seek immediate medical attention should they occur. Patient verbalized understanding of these instructions and education. Patient Consent Given: Yes Education handout provided: Yes Muscles treated: Right cervical multifidi, right suboccipitals, right upper trap, and right delts Electrical stimulation performed: No Parameters: N/A Treatment response/outcome: Utilized skilled palpation to identify bony landmarks and trigger points.  Able to illicit twitch response and muscle elongation.  Soft tissue mobilization following to further promote tissue elongation to bilat cervical paraspinals and right upper arm.     PATIENT EDUCATION: Education details: Issued HEP Person educated: Patient Education method: Explanation, Demonstration, and Handouts Education comprehension: verbalized understanding and returned demonstration  HOME EXERCISE PROGRAM: Access Code: [email or web address] URL: [email or web address] Date: 12/04/2022 Prepared by: Clydie Braun Tenita Cue  Exercises - Seated Wrist Extension Stretch  - 1 x  daily - 7 x weekly - 2 reps - 30 sec hold - Standing Tricep Extensions with Resistance  - 1 x daily - 7 x weekly - 2 sets - 10 reps - red band hold - Wrist Prayer Stretch  - 1 x daily - 7 x weekly - 2 reps - 20 sec hold - Seated Scapular Retraction  - 1 x daily - 7 x weekly - 2 sets - 10 reps - Standing Ulnar Nerve Glide  - 1 x daily - 7 x weekly - 2 sets - 10 reps  ASSESSMENT:  CLINICAL IMPRESSION: Natalie Leblanc presents to skilled PT reporting some increased elbow pain today.  Patient does report that her shoulder pain is less.  Patient able to progress through strengthening exercises and does have an improvement noted in Dash score.  Patient with some difficulty with grip strengthening exercise secondary to weakness.  Patient continues to require skilled PT to progress towards goal related activities.  OBJECTIVE IMPAIRMENTS: decreased strength, impaired perceived functional ability, increased muscle spasms, impaired sensation, impaired UE functional use, postural dysfunction, and pain.   ACTIVITY LIMITATIONS: carrying, lifting, and sleeping  PARTICIPATION LIMITATIONS: cleaning, laundry, shopping, community activity, and occupation  PERSONAL FACTORS: Past/current experiences, Time since onset of injury/illness/exacerbation, and 1-2 comorbidities: Right wrist ganglion cyst removal in July 2024, right medial epicondylitis  are also affecting patient's functional outcome.   REHAB POTENTIAL: Good  CLINICAL DECISION MAKING: Evolving/moderate complexity  EVALUATION COMPLEXITY: Moderate  GOALS: Goals reviewed with patient? Yes  SHORT TERM GOALS: Target date: 12/26/2022  Pt will be independent with initial HEP. Baseline: Goal status: MET  2.  Pt will report at least a 25% improvement in  symptoms. Baseline:  Goal status: IN PROGRESS   LONG TERM GOALS: Target date: 01/30/2023  Pt will be independent with advanced HEP. Baseline:  Goal status: IN PROGRESS  2.  Pt will increase right grip  strength to at least 30 pounds to allow her to perform job related tasks. Baseline: 20 pounds Goal status: INITIAL  3.  Pt will increase right UE strength to at least 4+/5 to allow her to perform job related tasks with increased ease. Baseline:  Goal status: INITIAL  4.  Patient will report no increase in pain with playing video games and with drawing. Baseline:  Goal status: INITIAL   PLAN: PT FREQUENCY: 2x/week  PT DURATION: 8 weeks  PLANNED INTERVENTIONS: Therapeutic exercises, Therapeutic activity, Neuromuscular re-education, Balance training, Gait training, Patient/Family education, Self Care, Joint mobilization, Joint manipulation, Aquatic Therapy, Dry Needling, Electrical stimulation, Spinal manipulation, Spinal mobilization, Cryotherapy, Moist heat, Taping, Vasopneumatic device, Traction, Ultrasound, Ionotophoresis 4mg /ml Dexamethasone, Manual therapy, and Re-evaluation  PLAN FOR NEXT SESSION: Assess response to dry needling, manual therapy as indicated, strengthening, NO IONTO - makes patient nauseated   Reather Laurence, PT, DPT 01/12/23, 1:30 PM  [facility] [address], [address] [place], [place] [address] Phone # [phone] Fax [phone]

## 2023-01-13 ENCOUNTER — Ambulatory Visit (INDEPENDENT_AMBULATORY_CARE_PROVIDER_SITE_OTHER): Payer: Self-pay | Admitting: Sports Medicine

## 2023-01-13 ENCOUNTER — Encounter: Payer: Self-pay | Admitting: Physical Therapy

## 2023-01-13 ENCOUNTER — Ambulatory Visit: Payer: 59 | Admitting: Physical Therapy

## 2023-01-13 DIAGNOSIS — M25521 Pain in right elbow: Secondary | ICD-10-CM

## 2023-01-13 DIAGNOSIS — R252 Cramp and spasm: Secondary | ICD-10-CM

## 2023-01-13 DIAGNOSIS — M7701 Medial epicondylitis, right elbow: Secondary | ICD-10-CM

## 2023-01-13 DIAGNOSIS — M6281 Muscle weakness (generalized): Secondary | ICD-10-CM

## 2023-01-13 NOTE — Therapy (Signed)
OUTPATIENT PHYSICAL THERAPY TREATMENT NOTE   Patient Name: Natalie Leblanc MRN: [id number] DOB:November 15, 1990, 32 y.o., female Today's Date: 01/13/2023  END OF SESSION:  PT End of Session - 01/13/23 1104     Visit Number 7    Date for PT Re-Evaluation 01/30/23    Authorization Type [employer] - [employer] approved 8 visits 12/04/2022-02/02/2023    Authorization - Visit Number 6    Authorization - Number of Visits 8    PT Start Time 1102    PT Stop Time 1142    PT Time Calculation (min) 40 min    Activity Tolerance Patient tolerated treatment well    Behavior During Therapy [facility] for tasks assessed/performed              Past Medical History:  Diagnosis Date   COVID    Depression    PTSD (post-traumatic stress disorder)    Past Surgical History:  Procedure Laterality Date   BREAST LUMPECTOMY Left    benign per patient   Patient Active Problem List   Diagnosis Date Noted   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 12/28/2019   Vitamin D deficiency 12/23/2019   Depression    PTSD (post-traumatic stress disorder)     PCP: Philip Aspen, Limmie Patricia, MD  REFERRING PROVIDER: Richardean Sale, DO  REFERRING DIAG: [phone] (ICD-10-CM) - Right elbow pain  THERAPY DIAG:  Pain in right elbow  Muscle weakness  Medial epicondylitis of elbow, right  Cramp and spasm  Rationale for Evaluation and Treatment: Rehabilitation  ONSET DATE: Last September (but had this injury in the past before)  SUBJECTIVE:                                                                                                                                                                                      SUBJECTIVE STATEMENT: My elbow is hurting today.  Really no change in elbow pain since starting PT  Hand dominance: Right  PERTINENT HISTORY: S/P Right Ganglion Cyst removal on 10/16/2022, PTSD  PAIN:  Are you having pain? Yes: NPRS scale: 8/10 Pain location: right medial  elbow Pain description: sharp Aggravating factors: movement, pushing, reaching Relieving factors: medication, PT helped in the past  PRECAUTIONS: Other: 20 pound lifting restriction per Dr Edison Pace note  RED FLAGS: None   WEIGHT BEARING RESTRICTIONS: No  FALLS:  Has patient fallen in last 6 months? Yes. 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  NEXT MD VISIT: Dr Jean Rosenthal 02/02/2023  OBJECTIVE:   DIAGNOSTIC FINDINGS:  Right Elbow MRI on 06/03/2022: IMPRESSION: 1. No specific internal derangement is identified to explain the patient's symptoms.  PATIENT SURVEYS :  Eval:  [name] 52.27 01/12/2023:  Quick Dash 40.91   COGNITION: Overall cognitive status: Within functional limits for tasks assessed     SENSATION: Pt reports that she has nerve pain "like when you hit your funny bone" at some points  POSTURE: Rounded shoulders, forward flexed  UPPER EXTREMITY ROM:   Eval:  Pt has ROM WFL, however, has pain with right elbow flexion/extension and supination/pronation  UPPER EXTREMITY MMT:  Eval:   Right shoulder and elbow strength of 4/5 Left shoulder/UE strength is 4+ to 5/5 grossly throughout Right grip strength of 20 lbs Left grip strength of 25 lbs  SHOULDER SPECIAL TESTS: Impingement tests: Hawkins/Kennedy impingement test: positive  reports pain in right elbow and shoulder Rotator cuff assessment: Empty can test: positive  reports shoulder pain on  right   PALPATION:  Tender to palpation around right shoulder region and biceps tendon insertion. Tenderness with muscle spasms noted in right medial elbow region   TODAY'S TREATMENT:                                                                                                                                         Date:  01/13/2023 UBE L1 fwd x1 min, bwd x 2 min STM Rt wrist flexors, cross friction to common flexor tendon Trigger Point Dry-Needling  Treatment instructions: Expect mild to moderate muscle soreness. S/S of pneumothorax if dry needled over a lung field, and to seek immediate medical attention should they occur. Patient verbalized understanding of these instructions and education.  Patient Consent Given: Yes Education handout provided: Previously provided Muscles treated: wrist flexors and common flexor tendon Electrical stimulation performed: No Parameters: N/A Treatment response/outcome: deep ache with referral to medial elbow Passive wrist flexor stretches with elbow bent 3x30" Rt Seated 4lb wrist isometric palm up elbow bent 10x5" Ice to wrist flexors and medial elbow  Date:  01/12/2023 UBE level 1.2 x3 min each direction with PT present to discuss status Quick Dash 40.91  Seated grip strengthening with webbed orange disc 2x10 bilat Seated shoulder extension with red tband 2x10 Seated supination/pronation with 2# 2x10 bilat Seated shoulder flexion with 2# 2x10 bilat Seated shoulder abduction with 2# 2x10 bilat Seated shoulder 'punch' with 2# 2x10 bilat Manual therapy:  soft tissue mobilization to right forearm and elbow region and manual trigger point release to palpable trigger points. Seated prayer stretch 2x20 sec   Date:  01/06/2023  UBE level 1.0 x3 min each direction with PT present to discuss status Standing rows with red tband 2x10 Standing shoulder extension with red tband 2x10 Standing rows with red tband 2x10 Standing shoulder ER with red tband  2x10 Standing shoulder horizontal abduction with red tband 2x10 Standing 4D scapular stabilization with 2# light blue plyoball on wall x20 each bilat Trigger Point Dry-Needling  Treatment instructions: Expect mild to moderate muscle soreness. S/S of pneumothorax if dry needled over a lung field, and to seek immediate medical attention should they occur. Patient verbalized understanding of these instructions and education. Patient Consent Given: Yes Education handout provided: Yes Muscles treated: right upper trap, right rhomboids, right wrist extensors, and right delts Electrical stimulation performed: No Parameters: N/A Treatment response/outcome: Utilized skilled palpation to identify bony landmarks and trigger points.  Able to illicit twitch response and muscle elongation.  Soft tissue mobilization following to further promote tissue elongation to bilat cervical paraspinals and right upper arm.   PATIENT EDUCATION: Education details: Issued HEP Person educated: Patient Education method: Explanation, Demonstration, and Handouts Education comprehension: verbalized understanding and returned demonstration  HOME EXERCISE PROGRAM: Access Code: [email or web address] URL: [email or web address] Date: 12/04/2022 Prepared by: Clydie Braun Menke  Exercises - Seated Wrist Extension Stretch  - 1 x daily - 7 x weekly - 2 reps - 30 sec hold - Standing Tricep Extensions with Resistance  - 1 x daily - 7 x weekly - 2 sets - 10 reps - red band hold - Wrist Prayer Stretch  - 1 x daily - 7 x weekly - 2 reps - 20 sec hold - Seated Scapular Retraction  - 1 x daily - 7 x weekly - 2 sets - 10 reps - Standing Ulnar Nerve Glide  - 1 x daily - 7 x weekly - 2 sets - 10 reps  ASSESSMENT:  CLINICAL IMPRESSION: Inda reports ongoing medial elbow pain which has remained unchanged since starting PT.  She reported she has been playing video games and was educated that this is still using the muscles that can affect  the elbow. She does have signif tension and trigger points in wrist flexors so we did targeted DN today including into common flexor tendon to initiate a new healing stage given stubbornness of symptoms.  All locations referred pain into medial elbow suggesting pain source.  Initiated wrist flexor isometrics today with 4lb.    OBJECTIVE IMPAIRMENTS: decreased strength, impaired perceived functional ability, increased muscle spasms, impaired sensation, impaired UE functional use, postural dysfunction, and pain.   ACTIVITY LIMITATIONS: carrying, lifting, and sleeping  PARTICIPATION LIMITATIONS: cleaning, laundry, shopping, community activity, and occupation  PERSONAL FACTORS: Past/current experiences, Time since onset of injury/illness/exacerbation, and 1-2 comorbidities: Right wrist ganglion cyst removal in July 2024, right medial epicondylitis  are also affecting patient's functional outcome.   REHAB POTENTIAL: Good  CLINICAL DECISION MAKING: Evolving/moderate complexity  EVALUATION COMPLEXITY: Moderate  GOALS: Goals reviewed with patient? Yes  SHORT TERM GOALS: Target date: 12/26/2022  Pt will be independent with initial HEP. Baseline: Goal status: MET  2.  Pt will report at least a 25% improvement in symptoms. Baseline:  Goal status: IN PROGRESS   LONG TERM GOALS: Target date: 01/30/2023  Pt will be independent with advanced HEP. Baseline:  Goal status: IN PROGRESS  2.  Pt will increase right grip strength to at least 30 pounds to allow her to perform job related tasks. Baseline: 20 pounds Goal status: INITIAL  3.  Pt will increase right UE strength to  at least 4+/5 to allow her to perform job related tasks with increased ease. Baseline:  Goal status: INITIAL  4.  Patient will report no increase in pain with playing video games and with drawing. Baseline:  Goal status: INITIAL   PLAN: PT FREQUENCY: 2x/week  PT DURATION: 8 weeks  PLANNED INTERVENTIONS:  Therapeutic exercises, Therapeutic activity, Neuromuscular re-education, Balance training, Gait training, Patient/Family education, Self Care, Joint mobilization, Joint manipulation, Aquatic Therapy, Dry Needling, Electrical stimulation, Spinal manipulation, Spinal mobilization, Cryotherapy, Moist heat, Taping, Vasopneumatic device, Traction, Ultrasound, Ionotophoresis 4mg /ml Dexamethasone, Manual therapy, and Re-evaluation  PLAN FOR NEXT SESSION: did Pt make follow up appt with referring MD? Assess response to dry needling Rt forearm flexors and common flexor tendon, repeat wrist flexor isometrics and add eccentrics, manual therapy as indicated, strengthening, NO IONTO - makes patient nauseated  Morton Peters, PT 01/13/23 11:48 AM     [facility] [address], [address] [place], [place] [address] Phone # [phone] Fax [phone]

## 2023-01-13 NOTE — Progress Notes (Signed)
   Richardean Sale [facility] [address] [place] [address] Phone: [phone]   Extracorporeal Shockwave Therapy Note    Patient is being treated today with ECSWT. Informed consent was obtained and patient tolerated procedure well.   Therapy performed by Richardean Sale  Condition treated: Medial epicondylitis Treatment preset used: Epicondylitis chronic Energy used: 120 mJ Frequency used: 16 hz Number of pulses: 3000 Head Size: Medium Treatment #3 of #3  Follow-up as needed.  Could repeat ECSWT if patient found it beneficial on same day as physical therapy dry needling.  Could consider PRP versus hydrodissection  Electronically signed by:  [facility] 1:34 PM 01/13/23

## 2023-01-19 ENCOUNTER — Ambulatory Visit: Payer: 59 | Admitting: Rehabilitative and Restorative Service Providers"

## 2023-01-20 ENCOUNTER — Encounter: Payer: Self-pay | Admitting: Rehabilitative and Restorative Service Providers"

## 2023-01-20 ENCOUNTER — Ambulatory Visit: Payer: 59 | Admitting: Rehabilitative and Restorative Service Providers"

## 2023-01-20 DIAGNOSIS — M7701 Medial epicondylitis, right elbow: Secondary | ICD-10-CM

## 2023-01-20 DIAGNOSIS — M25521 Pain in right elbow: Secondary | ICD-10-CM

## 2023-01-20 DIAGNOSIS — M6281 Muscle weakness (generalized): Secondary | ICD-10-CM

## 2023-01-20 DIAGNOSIS — R252 Cramp and spasm: Secondary | ICD-10-CM

## 2023-01-20 NOTE — Progress Notes (Signed)
Natalie Leblanc D.[facility] [address] [place] [address] Phone: [phone]   Assessment and Plan:     1. Right elbow pain 2. 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  I suspect flares of pain are likely related to repetitive use at work.  As long as patient continues a job that requires repetitive motions with right arm, I suspect this condition will continue to flare as it has been present for over 1 year with completely unremarkable right elbow MRI findings on 06/03/2022 - Patient is concerned for alternative explanations for her pain.  She is concerned for myofascial pain, neurologic symptoms, triceps tendinopathy, shoulder pathology.  Patient's pain has never fit a neurologic pattern.  She has not presented with triceps or shoulder pain prior to today's visit, though she says they have been intermittently present.  Patient's symptoms continue to be consistent with primarily medial epicondylitis and partially lateral epicondylitis based on physical exam.  On patient request, we will refer to pain management to discuss alternative options - Patient states she did not tolerate meloxicam because she "felt hot and had an upset stomach", so we will not prescribe meloxicam.  Alternatively we will try a different NSAID to see if she tolerates it better.  Start Celebrex 200 mg twice daily for 2 weeks and can complete third week if still in pain. - Continue HEP and restart physical therapy.  New PT referral placed - May continue to use wrist brace which will decrease tension over wrist flexors and extensors on elbow   Pertinent previous records  reviewed include none   Follow Up: 6 weeks for reevaluation.  We have discussed PRP in the past, however cost has been a deterrent.  Could try experimental CSI such as intra-articular right elbow   Subjective:   I, Natalie Leblanc, am serving as a Neurosurgeon for Doctor Richardean Sale   Chief Complaint: right elbow pain    HPI:    12/24/21 Patient is a 32 year old female complaining of right elbow pain . Patient states  she suffered an injury at work about a year ago.  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  She is wondering what she can do. She states that the task of lifting the boxes undid all of her healing . Looks for new restrictions .  Went to work place occupational therapy for 3 weeks.    No numbness or tingling in the elbow , feels like she can hear her elbow grinding, no meds for the pain, is not able to lift things with out pain, one of the meds she takes makes her drowsy, hurts her when she extends her arm    01/14/2022 Patient states that its a little better but if we take the restrictions off she thinks the pain will come back    02/10/2022 Patient states that its probably a little better  but she keeps hitting it on things   04/14/2022 Patient states elbow is the same, only hurts when she lifts heavy , taking out the trash  05/22/2022 Patient states she needs FMLA , states she still has elbow pain    07/28/2022 Patient states the elbow is okay , still has pain    09/09/2022 Patient states she needs to follow up on work restrictions, recommendations for the cyst that has developed in her wrist she was told by PT, she needs a diagnosis for work    02/02/2023 Patient states she was doing better. But, after her last PT session her pain is now worse   Relevant Historical Information: None pertinent  Additional pertinent review of systems  negative.   Current Outpatient Medications:    celecoxib (CELEBREX) 200 MG capsule, Take 1 capsule (200 mg total) by mouth 2 (two) times daily., Disp: 60 capsule, Rfl: 0   hydrOXYzine (ATARAX) 25 MG tablet, Take 25 mg by mouth at bedtime as needed., Disp: , Rfl:    Multiple Vitamin (MULTIVITAMIN) capsule, Take 1 capsule by mouth daily., Disp: , Rfl:    rizatriptan (MAXALT) 10 MG tablet, Take 1 tablet (10 mg total) by mouth as needed for migraine. May repeat in 2 hours if needed, Disp: 10 tablet, Rfl: 6   sertraline (ZOLOFT) 100 MG tablet, Take 100 mg by mouth daily., Disp: , Rfl:    Objective:     Vitals:   02/02/23 1102  BP: 120/80  Pulse: 86  SpO2: 99%  Weight: 103 lb (46.7 kg)  Height: 4\' 11"  (1.499 m)      Body mass index is 20.8 kg/m.    Physical Exam:    General: Appears well, no acute distress, nontoxic and pleasant Neck: FROM, no pain Neuro: sensation is intact distally with no deficits, strenghth is 5/5 in elbow flexors/extenders/supinator/pronators and wrist flexors/extensors Psych: no evidence of anxiety or depression   Right ELBOW and upper extremity: no deformity, swelling or muscle wasting Normal Carrying angle Elbow ROM:0-140, supination and pronation 90 TTP medial epicondyle   NTTP over triceps, ticeps tendon, olecronon, lat epicondyle,  antecubital fossa, biceps tendon, supinator, pronator Negative tinnels over cubital tunnel Medial elbow pain with resisted wrist and middle digit extension Mild pain with resisted wrist flexion Mild pain with resisted supination Moderate pain with resisted pronation Negative valgus stress Negative varus stress Negative milking maneuver  Small well-healed surgical scar over dorsum of wrist at location where ganglion cyst used to be    Electronically signed by:  Natalie Leblanc D.[facility] 11:56 AM 02/02/23

## 2023-01-20 NOTE — Therapy (Signed)
OUTPATIENT PHYSICAL THERAPY TREATMENT NOTE   Patient Name: Natalie Leblanc MRN: [id number] DOB:July 10, 1990, 32 y.o., female Today's Date: 01/20/2023  END OF SESSION:  PT End of Session - 01/20/23 1100     Visit Number 8    Date for PT Re-Evaluation 01/30/23    Authorization Type U[employer] - [employer] approved 8 visits 12/04/2022-02/02/2023    Authorization - Visit Number 7    Authorization - Number of Visits 8    PT Start Time 1058    PT Stop Time 1140    PT Time Calculation (min) 42 min    Activity Tolerance Patient tolerated treatment well    Behavior During Therapy [facility] for tasks assessed/performed              Past Medical History:  Diagnosis Date   COVID    Depression    PTSD (post-traumatic stress disorder)    Past Surgical History:  Procedure Laterality Date   BREAST LUMPECTOMY Left    benign per patient   Patient Active Problem List   Diagnosis Date Noted   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 12/28/2019   Vitamin D deficiency 12/23/2019   Depression    PTSD (post-traumatic stress disorder)     PCP: Philip Aspen, Limmie Patricia, MD  REFERRING PROVIDER: Richardean Sale, DO  REFERRING DIAG: [phone] (ICD-10-CM) - Right elbow pain  THERAPY DIAG:  Pain in right elbow  Muscle weakness  Medial epicondylitis of elbow, right  Cramp and spasm  Rationale for Evaluation and Treatment: Rehabilitation  ONSET DATE: Last September (but had this injury in the past before)  SUBJECTIVE:                                                                                                                                                                                      SUBJECTIVE STATEMENT: Patient reports that she had the electrowave treatment by Dr Jean Rosenthal and it did seem to help some.  Pt reports overall having approximately 40-50% improvement in symptoms.  States that she has had greater improvement with strengthening vs pain management.  Hand  dominance: Right  PERTINENT HISTORY: S/P Right Ganglion Cyst removal on 10/16/2022, PTSD  PAIN:  Are you having pain? Yes: NPRS scale: 7/10 Pain location: right medial elbow Pain description: sharp Aggravating factors: movement, pushing, reaching Relieving factors: medication, PT helped in the past  PRECAUTIONS: Other: 20 pound lifting restriction per Dr Edison Pace note  RED FLAGS: None   WEIGHT BEARING RESTRICTIONS: No  FALLS:  Has patient fallen in last 6 months? Yes. 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  NEXT MD VISIT: Dr Jean Rosenthal 02/02/2023  OBJECTIVE:   DIAGNOSTIC FINDINGS:  Right Elbow MRI on 06/03/2022: IMPRESSION: 1. No specific internal derangement is identified to explain the patient's symptoms.  PATIENT SURVEYS :  Eval:  [name] 52.27 01/12/2023:  Quick Dash 40.91   COGNITION: Overall cognitive status: Within functional limits for tasks assessed     SENSATION: Pt reports that she has nerve pain "like when you hit your funny bone" at some points  POSTURE: Rounded shoulders, forward flexed  UPPER EXTREMITY ROM:   Eval:  Pt has ROM WFL, however, has pain with right elbow flexion/extension and supination/pronation  UPPER EXTREMITY MMT:  Eval:   Right shoulder and elbow strength of 4/5 Left shoulder/UE strength is 4+ to 5/5 grossly throughout Right grip strength of 20 lbs Left grip strength of 25 lbs  01/20/2023: Right grip strength of 25 lbs Left  grip strength of 25 lbs  SHOULDER SPECIAL TESTS: Impingement tests: Hawkins/Kennedy impingement test: positive  reports pain in right elbow and shoulder Rotator cuff assessment: Empty can test: positive  reports shoulder pain on right   PALPATION:  Tender to palpation around right shoulder region and biceps tendon insertion. Tenderness with muscle spasms noted in right medial elbow region   TODAY'S TREATMENT:                                                                                                                                          Date:  01/20/2023 UBE level 1.0 x3 min each direction with PT present to discuss status Seated 4lb wrist isometric palm up elbow bent 10x5" Grip Strength Seated shoulder flexion with 2# x10 bilat Seated shoulder abduction with 2# x10 bilat Seated shoulder 'punch' with 2# x10 bilat Seated supination/pronation with 2# x10 bilat Seated grip strengthening with webbed orange disc x10 bilat Triceps pressdown 10# 2x5 Ultrasound to right medial elbow area:  3.3 MHz, 2.0 W/cm2 x8 min Kinesiotape to right medial elbow:  two "I" strips in a cross pattern over area of pain   Date:  01/13/2023 UBE L1 fwd x1 min, bwd x 2 min STM Rt wrist flexors, cross friction to common flexor tendon Trigger Point Dry-Needling  Treatment instructions: Expect mild to moderate muscle soreness. S/S of pneumothorax if dry needled over a lung field, and to seek immediate medical attention should they occur. Patient verbalized understanding of these instructions and education.  Patient Consent Given: Yes Education handout provided: Previously provided Muscles treated: wrist  flexors and common flexor tendon Electrical stimulation performed: No Parameters: N/A Treatment response/outcome: deep ache with referral to medial elbow Passive wrist flexor stretches with elbow bent 3x30" Rt Seated 4lb wrist isometric palm up elbow bent 10x5" Ice to wrist flexors and medial  elbow  Date:  01/12/2023 UBE level 1.2 x3 min each direction with PT present to discuss status Quick Dash 40.91  Seated grip strengthening with webbed orange disc 2x10 bilat Seated shoulder extension with red tband 2x10 Seated supination/pronation with 2# 2x10 bilat Seated shoulder flexion with 2# 2x10 bilat Seated shoulder abduction with 2# 2x10 bilat Seated shoulder 'punch' with 2# 2x10 bilat Manual therapy:  soft tissue mobilization to right forearm and elbow region and manual trigger point release to palpable trigger points. Seated prayer stretch 2x20 sec    PATIENT EDUCATION: Education details: Issued HEP Person educated: Patient Education method: Explanation, Demonstration, and Handouts Education comprehension: verbalized understanding and returned demonstration  HOME EXERCISE PROGRAM: Access Code: [email or web address] URL: [email or web address] Date: 12/04/2022 Prepared by: Clydie Braun Lindia Garms  Exercises - Seated Wrist Extension Stretch  - 1 x daily - 7 x weekly - 2 reps - 30 sec hold - Standing Tricep Extensions with Resistance  - 1 x daily - 7 x weekly - 2 sets - 10 reps - red band hold - Wrist Prayer Stretch  - 1 x daily - 7 x weekly - 2 reps - 20 sec hold - Seated Scapular Retraction  - 1 x daily - 7 x weekly - 2 sets - 10 reps - Standing Ulnar Nerve Glide  - 1 x daily - 7 x weekly - 2 sets - 10 reps  ASSESSMENT:  CLINICAL IMPRESSION: Kamea reports skilled PT reporting that she is overall making progress towards increased strengthening.  Patient has improved right grip strength to 25 pounds.  Patient with continued increased pain, so initiated ultrasound treatment today to assess for decreased overall pain.  Additionally, applied K-tape to right medial elbow to assist with pain management.  Educated pt on the benefits and uses of tape.  Educated pt on proper removal of tape when ready to remove.  Patient reports at end of session, the tape does seem to be helping some  with the decreased pain during motion.  OBJECTIVE IMPAIRMENTS: decreased strength, impaired perceived functional ability, increased muscle spasms, impaired sensation, impaired UE functional use, postural dysfunction, and pain.   ACTIVITY LIMITATIONS: carrying, lifting, and sleeping  PARTICIPATION LIMITATIONS: cleaning, laundry, shopping, community activity, and occupation  PERSONAL FACTORS: Past/current experiences, Time since onset of injury/illness/exacerbation, and 1-2 comorbidities: Right wrist ganglion cyst removal in July 2024, right medial epicondylitis  are also affecting patient's functional outcome.   REHAB POTENTIAL: Good  CLINICAL DECISION MAKING: Evolving/moderate complexity  EVALUATION COMPLEXITY: Moderate  GOALS: Goals reviewed with patient? Yes  SHORT TERM GOALS: Target date: 12/26/2022  Pt will be independent with initial HEP. Baseline: Goal status: MET  2.  Pt will report at least a 25% improvement in symptoms. Baseline:  Goal status: MET (reports 40-50% improvement on 01/20/2023)   LONG TERM GOALS: Target date: 01/30/2023  Pt will be independent with advanced HEP. Baseline:  Goal status: IN PROGRESS  2.  Pt will increase right grip strength to at least 30 pounds to allow her to perform job related tasks. Baseline: 20 pounds Goal status: IN PROGRESS (see above)  3.  Pt will increase right UE strength to at least 4+/5 to allow her to perform job related tasks with increased ease. Baseline:  Goal status: IN PROGRESS  4.  Patient will report no increase in pain with playing video games and with drawing. Baseline:  Goal status: IN PROGRESS   PLAN: PT FREQUENCY: 2x/week  PT DURATION: 8 weeks  PLANNED INTERVENTIONS: Therapeutic exercises, Therapeutic activity, Neuromuscular re-education, Balance training, Gait training, Patient/Family education, Self Care, Joint mobilization, Joint manipulation, Aquatic Therapy, Dry Needling, Electrical stimulation,  Spinal manipulation, Spinal mobilization, Cryotherapy, Moist heat, Taping, Vasopneumatic device, Traction, Ultrasound, Ionotophoresis 4mg /ml Dexamethasone, Manual therapy, and Re-evaluation  PLAN FOR NEXT SESSION: Assess response to ultrasound and kinesiotape, repeat wrist flexor isometrics and add eccentrics, manual therapy as indicated, strengthening, NO IONTO - makes patient nauseated    Reather Laurence, PT, DPT 01/20/23, 12:00 PM  [facility] [address], [address] [place], [place] [address] Phone # [phone] Fax [phone]

## 2023-01-23 NOTE — Therapy (Addendum)
OUTPATIENT PHYSICAL THERAPY TREATMENT NOTE AND LATE ENTRY DISCHARGE SUMMARY   Patient Name: Natalie Leblanc MRN: [id number] DOB:[birth year], 32 y.o., female Today's Date: 01/26/2023  END OF SESSION:  PT End of Session - 01/26/23 1108     Visit Number 9    Date for PT Re-Evaluation 01/30/23    Authorization Type U[employer] - [employer] approved 4 visits to 12/27    Authorization - Visit Number 1    Authorization - Number of Visits 4    PT Start Time 1105    PT Stop Time 1144    PT Time Calculation (min) 39 min    Activity Tolerance Patient tolerated treatment well;Patient limited by pain    Behavior During Therapy [facility] for tasks assessed/performed               Past Medical History:  Diagnosis Date   COVID    Depression    PTSD (post-traumatic stress disorder)    Past Surgical History:  Procedure Laterality Date   BREAST LUMPECTOMY Left    benign per patient   Patient Active Problem List   Diagnosis Date Noted   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 12/28/2019   Vitamin D deficiency 12/23/2019   Depression    PTSD (post-traumatic stress disorder)     PCP: Natalie Leblanc, Natalie Patricia, MD  REFERRING PROVIDER: Richardean Sale, DO  REFERRING DIAG: [phone] (ICD-10-CM) - Right elbow pain  THERAPY DIAG:  Pain in right elbow  Muscle weakness  Medial epicondylitis of elbow, right  Cramp and spasm  Rationale for Evaluation and Treatment: Rehabilitation  ONSET DATE: Last September (but had this injury in the past before)  SUBJECTIVE:                                                                                                                                                                                      SUBJECTIVE STATEMENT: The tape helped some.    Hand dominance: Right  PERTINENT HISTORY: S/P Right Ganglion Cyst removal on 10/16/2022, PTSD  PAIN:  Are you having pain? Yes: NPRS scale: 5/10 Pain location: right medial elbow Pain  description: sharp Aggravating factors: movement, pushing, reaching Relieving factors: medication, PT helped in the past  PRECAUTIONS: Other: 20 pound lifting restriction per Natalie Leblanc note  RED FLAGS: None   WEIGHT BEARING RESTRICTIONS: No  FALLS:  Has patient fallen in last 6 months? Yes. 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  NEXT MD VISIT: Natalie Leblanc 02/02/2023  OBJECTIVE:   DIAGNOSTIC FINDINGS:  Right Elbow MRI on 06/03/2022: IMPRESSION: 1. No specific internal derangement is identified to explain the patient's symptoms.  PATIENT SURVEYS :  Eval:  Natalie Leblanc 52.27 01/12/2023:  Quick Dash 40.91   COGNITION: Overall cognitive status: Within functional limits for tasks assessed     SENSATION: Pt reports that she has nerve pain "like when you hit your funny bone" at some points  POSTURE: Rounded shoulders, forward flexed  UPPER EXTREMITY ROM:   Eval:  Pt has ROM WFL, however, has pain with right elbow flexion/extension and supination/pronation  UPPER EXTREMITY MMT:  Eval:   Right shoulder and elbow strength of 4/5 Left shoulder/UE strength is 4+ to 5/5 grossly throughout Right grip strength of 20 lbs Left grip strength of 25 lbs  01/20/2023: Right grip strength of 25 lbs Left grip strength of 25 lbs  SHOULDER SPECIAL TESTS: Impingement tests: Hawkins/Kennedy impingement test: positive  reports pain in right elbow and shoulder Rotator cuff assessment:  Empty can test: positive  reports shoulder pain on right   PALPATION:  Tender to palpation around right shoulder region and biceps tendon insertion. Tenderness with muscle spasms noted in right medial elbow region   TODAY'S TREATMENT:                                                                                                                                          Date:  01/26/2023 UBE level 1.0 x3 min each direction with PT present to discuss status Seated 4lb wrist isometric palm up elbow bent 5x5", with fwd reach x 5 (increases pain) Seated shoulder flexion with 2# x10 bilat Seated shoulder abduction with 2# x10 bilat Seated shoulder 'punch' with 2# x10 bilat Seated supination/pronation with 2# x10 bilat Triceps pressdown 10# 2x5 Passive wrist flexor and extension stretch x 10 sec ea Rt Trigger Point Dry-Needling  Treatment instructions: Expect mild to moderate muscle soreness. S/S of pneumothorax if dry needled over a lung field, and to seek immediate medical attention should they occur. Patient verbalized understanding of these instructions and education. Skilled palpation and monitoring of soft tissues during DN . STM and IASTM to R triceps and wrist flexors post DN. Patient Consent Given: Yes Education handout provided: Previously provided Muscles treated: wrist flexors and common flexor tendon, R triceps Electrical stimulation performed: No Parameters: N/A Treatment response/outcome: twitch response/elongation of tissues   Date:  01/20/2023 UBE level 1.0 x3 min each direction with PT present to discuss status Seated 4lb wrist isometric palm up elbow bent 10x5" Grip Strength Seated shoulder flexion with 2#  x10 bilat Seated shoulder abduction with 2# x10 bilat Seated shoulder 'punch' with 2# x10 bilat Seated supination/pronation with 2# x10 bilat Seated grip strengthening with webbed orange disc x10 bilat Triceps pressdown 10# 2x5 Ultrasound to right medial elbow  area:  3.3 MHz, 2.0 W/cm2 x8 min Kinesiotape to right medial elbow:  two "I" strips in a cross pattern over area of pain   Date:  01/13/2023 UBE L1 fwd x1 min, bwd x 2 min STM Rt wrist flexors, cross friction to common flexor tendon Trigger Point Dry-Needling  Treatment instructions: Expect mild to moderate muscle soreness. S/S of pneumothorax if dry needled over a lung field, and to seek immediate medical attention should they occur. Patient verbalized understanding of these instructions and education.  Patient Consent Given: Yes Education handout provided: Previously provided Muscles treated: wrist flexors and common flexor tendon Electrical stimulation performed: No Parameters: N/A Treatment response/outcome: deep ache with referral to medial elbow Passive wrist flexor stretches with elbow bent 3x30" Rt Seated 4lb wrist isometric palm up elbow bent 10x5" Ice to wrist flexors and medial elbow  Date:  01/12/2023 UBE level 1.2 x3 min each direction with PT present to discuss status Quick Dash 40.91  Seated grip strengthening with webbed orange disc 2x10 bilat Seated shoulder extension with red tband 2x10 Seated supination/pronation with 2# 2x10 bilat Seated shoulder flexion with 2# 2x10 bilat Seated shoulder abduction with 2# 2x10 bilat Seated shoulder 'punch' with 2# 2x10 bilat Manual therapy:  soft tissue mobilization to right forearm and elbow region and manual trigger point release to palpable trigger points. Seated prayer stretch 2x20 sec    PATIENT EDUCATION: Education details: Issued HEP Person educated: Patient Education method: Explanation, Demonstration, and Handouts Education comprehension: verbalized understanding and returned demonstration  HOME EXERCISE PROGRAM: Access Code: [email or web address] URL: [email or web address] Date: 12/04/2022 Prepared by: Clydie Braun Menke  Exercises - Seated Wrist Extension Stretch  - 1 x daily - 7 x weekly - 2 reps - 30 sec  hold - Standing Tricep Extensions with Resistance  - 1 x daily - 7 x weekly - 2 sets - 10 reps - red band hold - Wrist Prayer Stretch  - 1 x daily - 7 x weekly - 2 reps - 20 sec hold - Seated Scapular Retraction  - 1 x daily - 7 x weekly - 2 sets - 10 reps - Standing Ulnar Nerve Glide  - 1 x daily - 7 x weekly - 2 sets - 10 reps  ASSESSMENT:  CLINICAL IMPRESSION: Gianne presents with ongoing R elbow pain. She thought the tape helped some, but still reports 5/10 pain. She was aggravated by elbow isometrics with 4# weight today and demonstrates weakness and had some referral of pain in to deltoids with shoulder ABD. Trial of DN to R triceps in addition to wrist flexors today. Significant twitch responses in both areas.   OBJECTIVE IMPAIRMENTS: decreased strength, impaired perceived functional ability, increased muscle spasms, impaired sensation, impaired UE functional use, postural dysfunction, and pain.   ACTIVITY LIMITATIONS: carrying, lifting, and sleeping  PARTICIPATION LIMITATIONS: cleaning, laundry, shopping, community activity, and occupation  PERSONAL FACTORS: Past/current experiences, Time since onset of injury/illness/exacerbation, and 1-2 comorbidities: Right wrist ganglion cyst removal in July 2024, right medial epicondylitis  are also affecting patient's functional outcome.   REHAB POTENTIAL: Good  CLINICAL DECISION MAKING: Evolving/moderate complexity  EVALUATION COMPLEXITY: Moderate  GOALS: Goals reviewed with patient? Yes  SHORT TERM GOALS: Target date: 12/26/2022  Pt will be independent with initial HEP.  Baseline: Goal status: MET  2.  Pt will report at least a 25% improvement in symptoms. Baseline:  Goal status: MET (reports 40-50% improvement on 01/20/2023)   LONG TERM GOALS: Target date: 01/30/2023  Pt will be independent with advanced HEP. Baseline:  Goal status: IN PROGRESS  2.  Pt will increase right grip strength to at least 30 pounds to allow her to  perform job related tasks. Baseline: 20 pounds Goal status: IN PROGRESS (see above)  3.  Pt will increase right UE strength to at least 4+/5 to allow her to perform job related tasks with increased ease. Baseline:  Goal status: IN PROGRESS  4.  Patient will report no increase in pain with playing video games and with drawing. Baseline:  Goal status: IN PROGRESS   PLAN: PT FREQUENCY: 2x/week  PT DURATION: 8 weeks  PLANNED INTERVENTIONS: Therapeutic exercises, Therapeutic activity, Neuromuscular re-education, Balance training, Gait training, Patient/Family education, Self Care, Joint mobilization, Joint manipulation, Aquatic Therapy, Dry Needling, Electrical stimulation, Spinal manipulation, Spinal mobilization, Cryotherapy, Moist heat, Taping, Vasopneumatic device, Traction, Ultrasound, Ionotophoresis 4mg /ml Dexamethasone, Manual therapy, and Re-evaluation  PLAN FOR NEXT SESSION: Assess response to ultrasound and kinesiotape, repeat wrist flexor isometrics and add eccentrics, manual therapy as indicated, strengthening, NO IONTO - makes patient nauseated    Solon Palm, PT  01/26/23, 6:26 PM  [facility] [address], [address] [place], [place] [address] Phone # [phone] Fax [phone]    PHYSICAL THERAPY DISCHARGE SUMMARY  Patient returned to Natalie Leblanc on 02/02/2023, and she is still having some pain.  Patient will be discharging PT and transitioning to outpatient OT for further treatment of elbow pain.  Natalie Leblanc has sent order for OT Evaluation to [facility].  Patient agrees to discharge. Patient goals were partially met. Patient is being discharged due to  to transition from PT to OT for further treatment.  Clydie Braun Menke, PT, DPT 02/03/23, 8:57 AM

## 2023-01-26 ENCOUNTER — Encounter: Payer: Self-pay | Admitting: Physical Therapy

## 2023-01-26 ENCOUNTER — Ambulatory Visit: Payer: 59 | Admitting: Physical Therapy

## 2023-01-26 DIAGNOSIS — M7701 Medial epicondylitis, right elbow: Secondary | ICD-10-CM

## 2023-01-26 DIAGNOSIS — M6281 Muscle weakness (generalized): Secondary | ICD-10-CM

## 2023-01-26 DIAGNOSIS — R252 Cramp and spasm: Secondary | ICD-10-CM

## 2023-01-26 DIAGNOSIS — M25521 Pain in right elbow: Secondary | ICD-10-CM | POA: Diagnosis not present

## 2023-01-27 ENCOUNTER — Ambulatory Visit: Payer: 59 | Admitting: Rehabilitative and Restorative Service Providers"

## 2023-02-02 ENCOUNTER — Ambulatory Visit (INDEPENDENT_AMBULATORY_CARE_PROVIDER_SITE_OTHER): Payer: 59 | Admitting: Sports Medicine

## 2023-02-02 DIAGNOSIS — M7701 Medial epicondylitis, right elbow: Secondary | ICD-10-CM

## 2023-02-02 DIAGNOSIS — M25521 Pain in right elbow: Secondary | ICD-10-CM | POA: Diagnosis not present

## 2023-02-02 MED ORDER — CELECOXIB 200 MG PO CAPS: 200.0000 mg | ORAL_CAPSULE | Freq: Two times a day (BID) | ORAL | 0 refills | Status: DC

## 2023-02-02 NOTE — Patient Instructions (Addendum)
-   Start celebrex 200mg  daily x2 weeks.  If still having pain after 2 weeks, complete 3rd-week of celebrex. May use remaining celebrex as needed once daily for pain control.  Do not to use additional NSAIDs while taking celebrex.  May use Tylenol [phone] mg 2 to 3 times a day for breakthrough pain.  PT referral Follow up in 6 weeks

## 2023-02-03 ENCOUNTER — Other Ambulatory Visit: Payer: Self-pay | Admitting: Sports Medicine

## 2023-02-03 DIAGNOSIS — M7701 Medial epicondylitis, right elbow: Secondary | ICD-10-CM

## 2023-02-03 DIAGNOSIS — M25521 Pain in right elbow: Secondary | ICD-10-CM

## 2023-02-03 NOTE — Progress Notes (Signed)
New referral placed.

## 2023-02-10 ENCOUNTER — Other Ambulatory Visit: Payer: Self-pay

## 2023-02-10 ENCOUNTER — Ambulatory Visit: Payer: 59 | Attending: Sports Medicine | Admitting: Occupational Therapy

## 2023-02-10 DIAGNOSIS — M6281 Muscle weakness (generalized): Secondary | ICD-10-CM | POA: Insufficient documentation

## 2023-02-10 DIAGNOSIS — M25521 Pain in right elbow: Secondary | ICD-10-CM | POA: Insufficient documentation

## 2023-02-10 DIAGNOSIS — M7701 Medial epicondylitis, right elbow: Secondary | ICD-10-CM | POA: Diagnosis present

## 2023-02-10 DIAGNOSIS — R208 Other disturbances of skin sensation: Secondary | ICD-10-CM | POA: Diagnosis present

## 2023-02-10 NOTE — Therapy (Signed)
OUTPATIENT OCCUPATIONAL THERAPY ORTHO EVALUATION  Patient Name: Natalie Leblanc MRN: [id number] DOB:[birth year], 32 y.o., female Today's Date: 02/10/2023  PCP: Philip Aspen, Limmie Patricia MD REFERRING PROVIDER: Richardean Sale, DO  END OF SESSION:  OT End of Session - 02/10/23 1455     Visit Number 1    Number of Visits 13    Date for OT Re-Evaluation 04/10/23    Authorization Type UHC / [facility] Medicaid [facility] 2024             Past Medical History:  Diagnosis Date   COVID    Depression    PTSD (post-traumatic stress disorder)    Past Surgical History:  Procedure Laterality Date   BREAST LUMPECTOMY Left    benign per patient   Patient Active Problem List   Diagnosis Date Noted   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 12/28/2019   Vitamin D deficiency 12/23/2019   Depression    PTSD (post-traumatic stress disorder)     ONSET DATE: referral date 02/03/23 (injury at work ~1 year ago)  REFERRING DIAG: M25.521 (ICD-10-CM) - Right elbow pain M77.01 (ICD-10-CM) - Medial epicondylitis of right elbow  THERAPY DIAG:  Pain in right elbow  Muscle weakness  Other disturbances of skin sensation  Rationale for Evaluation and Treatment: Rehabilitation  SUBJECTIVE:   SUBJECTIVE STATEMENT: Pt reports a year ago at work, 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  Pt reports having returned to work and is working in a different area at [employer] where she has to [employer] and pallets - therefore with less repetition, but only has issues if a box is falling off and she has to stabilize it. Pt accompanied by: self  PERTINENT HISTORY: S/P Right Ganglion Cyst removal on 10/16/2022, PTSD   PRECAUTIONS: Other: 20 pound lifting restriction per Dr Edison Pace note  RED  FLAGS: None   WEIGHT BEARING RESTRICTIONS: No  PAIN:  Are you having pain? Yes: NPRS scale: 7/10 Pain location: R elbow (behind the elbow) medial - radiating up to the shoulder Pain description: pointy throbbing Aggravating factors: grasping items, lifting heavy items, pushing/pulling Relieving factors: ibuprofen  FALLS: Has patient fallen in last 6 months? 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  HAND DOMINANCE: Right  ADLs: Overall ADLs: Mod I, reports pain with repetitiveness and pressure when washing hair Transfers/ambulation related to ADLs: Independent Eating: Mod I Grooming: pain with repetitiveness of washing hair UB Dressing: Mod I LB Dressing: Mod I Toileting: Mod I Bathing: Mod I Tub Shower transfers: Mod I Equipment: Long handled sponge  IADLs: Housekeeping: pt reports pain with pressure when sweeping or mopping, no issues when washing dishes  FUNCTIONAL OUTCOME MEASURES: Quick Dash: 50% impairment  UPPER EXTREMITY ROM:   Pt has ROM  WFL, however, has pain with right elbow flexion/extension and supination/pronation   Active ROM Right eval Left eval  Shoulder flexion    Shoulder abduction    Shoulder adduction    Shoulder extension    Shoulder internal rotation    Shoulder external rotation    Elbow flexion 147 156  Elbow extension -7 -7  Wrist flexion [facility] WFL   Wrist extension [facility] WFL  Wrist ulnar deviation    Wrist radial deviation    Wrist pronation WFL, mod pain WFL  Wrist supination WFL, mod pain WFL  (Blank rows = not tested)   UPPER EXTREMITY MMT: Right shoulder and elbow strength of 4/5 Left shoulder/UE strength is 4+ to 5/5 grossly throughout  HAND FUNCTION: Grip strength: Right: 21 lbs; Left: 17 lbs, Assessed in stress testing position R: 16# , L: 20#  COORDINATION: 9 hole peg test: TBD  SENSATION: Pt reports that she has nerve pain "like when you hit your funny bone" at some points  EDEMA: no swelling observed or measured  COGNITION: Overall cognitive status: Within functional limits for tasks assessed  OBSERVATIONS: Tenderness with muscle spasms noted in right medial elbow region.  Pt grimacing in pain with elbow flexion/extension and supination/pronation.   TODAY'S TREATMENT:                                                                                                                              DATE:  02/10/23 Educated on POC, with recommendation to purchase counter force brace for wear during daytime, particularly at work and with repetitive movements.  Provided recommendations for purchasing options.  Also reviewed rest as possible with decreasing repetitive movements and plan to initiate massage and pain management.   PATIENT EDUCATION: Education details: Educated on role and purpose of OT as well as potential interventions and goals for therapy based on initial evaluation findings. Person educated: Patient Education method: Explanation and Handouts Education comprehension: verbalized understanding and needs further education  HOME EXERCISE PROGRAM: TBD  GOALS: Goals reviewed with patient? Yes  SHORT TERM GOALS: Target date: 03/13/23  Pt will be independent with initial HEP. Baseline: Goal status: INITIAL  2.  Pt will report understanding of use of modalities and splinting for decreased  pain. Baseline:  Goal status: INITIAL  3.  Pt will verbalize understanding of positioning to aid in sleep. Baseline:  Goal status: INITIAL   LONG TERM GOALS: Target date: 04/10/23  Pt will be independent with advanced HEP.  Baseline:  Goal status: INITIAL  2.  Pt will increase right grip strength to at least 30 pounds to allow her to perform work related tasks.  Baseline: 16-20# based on positioning Goal status: INITIAL  3.  Pt will demonstrate improved RUE strength and endurance as needed to place items into high range and low range as needed for work related tasks with increased ease. Baseline:  Goal status: INITIAL  4.  Pt will improve functional ability by  decreased impairment per Quick DASH assessment from 50% to 35% or better, for better quality of life.  Baseline: 50% Goal status: INITIAL   ASSESSMENT:  CLINICAL IMPRESSION: Patient is a 32 y.o. female who was seen today for occupational therapy evaluation for R elbow flare up of pain. Pt has experienced intermittent periods of improvement in pain from previous physical therapy. 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 PMHx includes increased pain in dry needling, S/P Right Ganglion Cyst removal on 10/16/2022, PTSD.  Pt will benefit from skilled occupational therapy services to address strength and coordination, ROM, pain management, altered sensation, GM/FM control, safety awareness, introduction of compensatory strategies/AE prn, and implementation of an HEP to improve participation and safety during ADLs, IADLs, and work tasks.    PERFORMANCE DEFICITS: in functional skills including ADLs, IADLs, sensation, ROM, strength, pain, Fine motor control, Gross motor control, decreased knowledge of precautions, decreased knowledge of use of DME, and UE functional use and psychosocial skills including coping  strategies, environmental adaptation, and routines and behaviors.   IMPAIRMENTS: are limiting patient from ADLs, IADLs, rest and sleep, work, and leisure.   COMORBIDITIES: may have co-morbidities  that affects occupational performance. Patient will benefit from skilled OT to address above impairments and improve overall function.  MODIFICATION OR ASSISTANCE TO COMPLETE EVALUATION: Min-Moderate modification of tasks or assist with assess necessary to complete an evaluation.  OT OCCUPATIONAL PROFILE AND HISTORY: Detailed assessment: Review of records and additional review of physical, cognitive, psychosocial history related to current functional performance.  CLINICAL DECISION MAKING: Moderate - several treatment options, min-mod task modification necessary  REHAB POTENTIAL: Good  EVALUATION COMPLEXITY: Moderate      PLAN:  OT FREQUENCY: 2x/week  OT DURATION: 6 weeks (asking for 8 weeks due to difficulty getting scheduled right away)  PLANNED INTERVENTIONS: 03474 OT Re-evaluation, 97535 self care/ADL training, 25956 therapeutic exercise, 97530 therapeutic activity, 97112 neuromuscular re-education, 97140 manual therapy, 97035 ultrasound, 97010 moist heat, 97010 cryotherapy, 97033 iontophoresis, 97760 Orthotics management and training, 38756 Splinting (initial encounter), [id number] Subsequent splinting/medication, passive range of motion, compression bandaging, energy conservation, coping strategies training, patient/family education, and DME and/or AE instructions  RECOMMENDED OTHER SERVICES: NA  CONSULTED AND AGREED WITH PLAN OF CARE: Patient  PLAN FOR NEXT SESSION: initiate massage strategies, educate on use of modalities to decrease pain.  May benefit from ultrasound.   Rosalio Loud, OTR/L 02/10/2023, 2:57 PM   [facility] at [facility] [address] [place], [address] [place], [place] [address] Phone # [phone] Fax # [phone]

## 2023-02-10 NOTE — Patient Instructions (Signed)
Counterforce brace

## 2023-02-16 ENCOUNTER — Telehealth: Payer: 59 | Admitting: Internal Medicine

## 2023-02-26 ENCOUNTER — Ambulatory Visit: Payer: 59 | Admitting: Occupational Therapy

## 2023-02-26 DIAGNOSIS — R208 Other disturbances of skin sensation: Secondary | ICD-10-CM

## 2023-02-26 DIAGNOSIS — M25521 Pain in right elbow: Secondary | ICD-10-CM | POA: Diagnosis not present

## 2023-02-26 DIAGNOSIS — M7701 Medial epicondylitis, right elbow: Secondary | ICD-10-CM

## 2023-02-26 DIAGNOSIS — M6281 Muscle weakness (generalized): Secondary | ICD-10-CM

## 2023-02-26 NOTE — Therapy (Signed)
OUTPATIENT OCCUPATIONAL THERAPY ORTHO Treatment Note  Patient Name: Natalie Leblanc MRN: [id number] DOB:1991-01-13, 32 y.o., female Today's Date: 02/26/2023  PCP: Philip Aspen, Limmie Patricia MD REFERRING PROVIDER: Richardean Sale, DO  END OF SESSION:  OT End of Session - 02/26/23 1154     Visit Number 2    Number of Visits 13    Date for OT Re-Evaluation 04/10/23    Authorization Type UHC / [place] [employer] [facility] 2024    OT Start Time 1153   arrival time   OT Stop Time 1230    OT Time Calculation (min) 37 min              Past Medical History:  Diagnosis Date   COVID    Depression    PTSD (post-traumatic stress disorder)    Past Surgical History:  Procedure Laterality Date   BREAST LUMPECTOMY Left    benign per patient   Patient Active Problem List   Diagnosis Date Noted   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 12/28/2019   Vitamin D deficiency 12/23/2019   Depression    PTSD (post-traumatic stress disorder)     ONSET DATE: referral date 02/03/23 (injury at work ~1 year ago)  REFERRING DIAG: M25.521 (ICD-10-CM) - Right elbow pain M77.01 (ICD-10-CM) - Medial epicondylitis of right elbow  THERAPY DIAG:  Pain in right elbow  Muscle weakness  Other disturbances of skin sensation  Medial epicondylitis of elbow, right  Rationale for Evaluation and Treatment: Rehabilitation  SUBJECTIVE:   SUBJECTIVE STATEMENT: Pt reports trying to do more at work.  Pt reports that she is now able to lift boxes, but after about 15 mins pt reports increased pain.   Pt accompanied by: self  PERTINENT HISTORY: S/P Right Ganglion Cyst removal on 10/16/2022, PTSD   PRECAUTIONS: Other: 20 pound lifting restriction per Dr Edison Pace note  RED FLAGS: None   WEIGHT BEARING RESTRICTIONS: No  PAIN:  Are you having pain? Yes: NPRS scale: 7/10 Pain location: R elbow (behind the elbow) medial - radiating up to the shoulder Pain description: pointy  throbbing Aggravating factors: grasping items, lifting heavy items, pushing/pulling Relieving factors: ibuprofen  FALLS: Has patient fallen in last 6 months? 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  HAND DOMINANCE: Right  ADLs: Overall ADLs: Mod I, reports pain with repetitiveness and pressure when washing hair Transfers/ambulation related to ADLs: Independent Eating: Mod I Grooming: pain with repetitiveness of washing hair UB Dressing: Mod I LB Dressing: Mod I Toileting: Mod I Bathing: Mod I Tub Shower transfers: Mod I Equipment: Long handled sponge  IADLs: Housekeeping: pt reports pain with pressure when sweeping or mopping, no issues when washing dishes  FUNCTIONAL OUTCOME MEASURES: Quick Dash: 50% impairment  UPPER EXTREMITY ROM:   Pt has ROM WFL, however, has pain with right elbow flexion/extension and supination/pronation   Active ROM Right eval Left eval  Shoulder flexion    Shoulder abduction  Shoulder adduction    Shoulder extension    Shoulder internal rotation    Shoulder external rotation    Elbow flexion 147 156  Elbow extension -7 -7  Wrist flexion [facility] WFL  Wrist extension [facility] WFL  Wrist ulnar deviation    Wrist radial deviation    Wrist pronation WFL, mod pain WFL  Wrist supination WFL, mod pain WFL  (Blank rows = not tested)   UPPER EXTREMITY MMT: Right  shoulder and elbow strength of 4/5 Left shoulder/UE strength is 4+ to 5/5 grossly throughout  HAND FUNCTION: Grip strength: Right: 21 lbs; Left: 17 lbs, Assessed in stress testing position R: 16# , L: 20#  COORDINATION: 9 hole peg test: TBD  SENSATION: Pt reports that she has nerve pain "like when you hit your funny bone" at some points  EDEMA: no swelling observed or measured  COGNITION: Overall cognitive status: Within functional limits for tasks assessed  OBSERVATIONS: Tenderness with muscle spasms noted in right medial elbow region.  Pt grimacing in pain with elbow flexion/extension and supination/pronation.   TODAY'S TREATMENT:                                                             DATE:  02/26/23 Brace: pt purchased an elbow support sleeve with padding at medial and lateral epicondyle.  Pt reports some benefits from compression and support, but unsure if enough as counter force brace.  However due to small stature and circumference of arm, pt concerned counter force brace would not fit appropriately.  OT added padded sleeve underneath elbow sleeve to decrease discomfort of ridges on inside of elbow.  Massage: OT educated pt on massage technique in clockwise, counter clockwise, parallel to muscle and perpendicular to muscle.  OT utilizing massage cream to decrease friction.  OT then educating pt on carryover and completion of massage on her own.  Pt demonstrating good technique. Moist heat: OT applied moist heat to R arm for 8 mins while educating on purpose of moist heat to aid in healing and relaxing muscles prior to exercise.  OT encouraged pt to purchase moist heat. ROM: attempted AROM with elbow bent and forearm in neutral, bending wrist backwards/away from body.  OT encouraged pt to hold 10-15 seconds.  Pt reports tolerable ROM, however reports stretch in forearm as well with prolonged hold.  Completed wrist extension with elbow bent by side and forearm up.  Pt reporting  increased stretch in this position. Joint protection principles: educated on avoiding carryings items with palms up, recommending palms down or least in neutral to decrease overuse.  Pt reports difficulty with this, especially at work/with work tasks.    02/10/23 Educated on POC, with recommendation to purchase counter force brace for wear during daytime, particularly at work and with repetitive movements.  Provided recommendations for purchasing options.  Also reviewed rest as possible with decreasing repetitive movements and plan to initiate massage and pain management.   PATIENT EDUCATION: Education details: ongoing condition specific education Person educated: Patient Education method: Chief Technology Officer Education comprehension: verbalized understanding and needs further education  HOME EXERCISE PROGRAM: Medial Epicondylitis handout from [place] [employer] [facility] pgs [phone]  GOALS: Goals reviewed with patient? Yes  SHORT TERM GOALS: Target date: 03/13/23  Pt will be independent with initial HEP.  Baseline: Goal status: IN PROGRESS  2.  Pt will report understanding of use of modalities and splinting for decreased pain. Baseline:  Goal status: IN PROGRESS  3.  Pt will verbalize understanding of positioning to aid in sleep. Baseline:  Goal status: IN PROGRESS   LONG TERM GOALS: Target date: 04/10/23  Pt will be independent with advanced HEP.  Baseline:  Goal status: IN PROGRESS  2.  Pt will increase right grip strength to at least 30 pounds to allow her to perform work related tasks.  Baseline: 16-20# based on positioning Goal status: IN PROGRESS  3.  Pt will demonstrate improved RUE strength and endurance as needed to place items into high range and low range as needed for work related tasks with increased ease. Baseline:  Goal status: IN PROGRESS  4.  Pt will improve functional ability by decreased impairment per Quick DASH assessment from 50% to 35% or better, for  better quality of life.  Baseline: 50% Goal status: IN PROGRESS   ASSESSMENT:  CLINICAL IMPRESSION: Pt demonstrating good technique with massage in all 4 directions and attempts at AROM with wrist extension with elbow bent at side and with wrist in neutral and then palm up.  Pt reports mild increase in stretch with palm up, compared to wrist in neutral.  Pt will benefit from continued massage and moist heat to decrease pain and increase tolerance to AROM.   PERFORMANCE DEFICITS: in functional skills including ADLs, IADLs, sensation, ROM, strength, pain, Fine motor control, Gross motor control, decreased knowledge of precautions, decreased knowledge of use of DME, and UE functional use and psychosocial skills including coping strategies, environmental adaptation, and routines and behaviors.     PLAN:  OT FREQUENCY: 2x/week  OT DURATION: 6 weeks (asking for 8 weeks due to difficulty getting scheduled right away)  PLANNED INTERVENTIONS: 97168 OT Re-evaluation, 97535 self care/ADL training, 16109 therapeutic exercise, 97530 therapeutic activity, 97112 neuromuscular re-education, 97140 manual therapy, 97035 ultrasound, 97010 moist heat, 97010 cryotherapy, 97033 iontophoresis, 97760 Orthotics management and training, 60454 Splinting (initial encounter), [id number] Subsequent splinting/medication, passive range of motion, compression bandaging, energy conservation, coping strategies training, patient/family education, and DME and/or AE instructions  RECOMMENDED OTHER SERVICES: NA  CONSULTED AND AGREED WITH PLAN OF CARE: Patient  PLAN FOR NEXT SESSION: Review massage strategies, educate on use of modalities to decrease pain.  May benefit from ultrasound.   Rosalio Loud, OTR/L 02/26/2023, 12:30 PM   [facility] Outpatient Rehab at [facility] [address] [place], [address] [place], [place] [address] Phone # [phone] Fax # [phone]

## 2023-03-04 ENCOUNTER — Ambulatory Visit: Payer: 59 | Admitting: Occupational Therapy

## 2023-03-09 ENCOUNTER — Ambulatory Visit: Payer: 59 | Attending: Sports Medicine | Admitting: Occupational Therapy

## 2023-03-09 DIAGNOSIS — M6281 Muscle weakness (generalized): Secondary | ICD-10-CM | POA: Diagnosis present

## 2023-03-09 DIAGNOSIS — R208 Other disturbances of skin sensation: Secondary | ICD-10-CM

## 2023-03-09 DIAGNOSIS — M25521 Pain in right elbow: Secondary | ICD-10-CM

## 2023-03-09 NOTE — Therapy (Signed)
OUTPATIENT OCCUPATIONAL THERAPY ORTHO Treatment Note  Patient Name: Natalie Leblanc MRN: [id number] DOB:1991-02-03, 32 y.o., female Today's Date: 03/09/2023  PCP: Philip Aspen, Limmie Patricia MD REFERRING PROVIDER: Richardean Sale, DO  END OF SESSION:  OT End of Session - 03/09/23 1455     Visit Number 3    Number of Visits 13    Date for OT Re-Evaluation 04/10/23    Authorization Type UHC / [place] [employer] 2024    OT Start Time 1320    OT Stop Time 1400    OT Time Calculation (min) 40 min               Past Medical History:  Diagnosis Date   COVID    Depression    PTSD (post-traumatic stress disorder)    Past Surgical History:  Procedure Laterality Date   BREAST LUMPECTOMY Left    benign per patient   Patient Active Problem List   Diagnosis Date Noted   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   Pt accompanied by: self  PERTINENT HISTORY: S/P Right Ganglion Cyst removal on 10/16/2022, PTSD   PRECAUTIONS: Other: 20 pound lifting restriction per Dr Edison Pace note  RED FLAGS: None   WEIGHT BEARING RESTRICTIONS: No  PAIN:  Are you having pain? Yes: NPRS scale: 5/10 Pain location: R elbow (behind the elbow) medial - radiating up to the shoulder Pain description: pointy throbbing Aggravating factors: grasping items, lifting heavy items, pushing/pulling Relieving  factors: ibuprofen  FALLS: Has patient fallen in last 6 months? 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  HAND DOMINANCE: Right  ADLs: Overall ADLs: Mod I, reports pain with repetitiveness and pressure when washing hair Transfers/ambulation related to ADLs: Independent Eating: Mod I Grooming: pain with repetitiveness of washing hair UB Dressing: Mod I LB Dressing: Mod I Toileting: Mod I Bathing: Mod I Tub Shower transfers: Mod I Equipment: Long handled sponge  IADLs: Housekeeping: pt reports pain with pressure when sweeping or mopping, no issues when washing dishes  FUNCTIONAL OUTCOME MEASURES: Quick Dash: 50% impairment  UPPER EXTREMITY ROM:   Pt has ROM WFL, however, has pain with right elbow flexion/extension and supination/pronation   Active ROM Right eval Left eval  Shoulder flexion    Shoulder abduction    Shoulder adduction    Shoulder extension    Shoulder internal rotation    Shoulder external  rotation    Elbow flexion 147 156  Elbow extension -7 -7  Wrist flexion [facility] WFL  Wrist extension [facility] WFL  Wrist ulnar deviation    Wrist radial deviation    Wrist pronation WFL, mod pain WFL  Wrist supination WFL, mod pain WFL  (Blank rows = not tested)   UPPER EXTREMITY MMT: Right shoulder and elbow strength of 4/5 Left shoulder/UE strength is 4+ to 5/5 grossly  throughout  HAND FUNCTION: Grip strength: Right: 21 lbs; Left: 17 lbs, Assessed in stress testing position R: 16# , L: 20#  COORDINATION: 9 hole peg test: TBD  SENSATION: Pt reports that she has nerve pain "like when you hit your funny bone" at some points  EDEMA: no swelling observed or measured  COGNITION: Overall cognitive status: Within functional limits for tasks assessed  OBSERVATIONS: Tenderness with muscle spasms noted in right medial elbow region.  Pt grimacing in pain with elbow flexion/extension and supination/pronation.   TODAY'S TREATMENT:                                                             DATE:  03/09/23 Exercises: engaged in prayer stretch x5 holding for 15 seconds. AROM: with elbow bent and forearm in neutral, wrist extension x5 AROM: with elbow bent and forearm supinated, wrist extension x5 AROM: with elbow straight, forearm in neutral, wrist extension x5 AROM: with elbow straight and forearm supinated, wrist extension x5 Attempted elbow straight and shoulder flexed to 90*, wrist extension - however pt with reports of increased pain and fatigue in shoulder with shoulder in flexed position - therefore encouraged resumption of AROM with elbow bent and straight at side during exercises. Massage: OT reiterated education regarding massage technique in clockwise, counter clockwise, parallel to muscle, and perpendicular to muscle.  OT utilizing massage cream to decrease friction.  Pt with some grimacing along muscle belly, but reporting no increase in pain.   02/26/23 Brace: pt purchased an elbow support sleeve with padding at medial and lateral epicondyle.  Pt reports some benefits from compression and support, but unsure if enough as counter force brace.  However due to small stature and circumference of arm, pt concerned counter force brace would not fit appropriately.  OT added padded sleeve underneath elbow sleeve to decrease discomfort of ridges on inside of  elbow.  Massage: OT educated pt on massage technique in clockwise, counter clockwise, parallel to muscle and perpendicular to muscle.  OT utilizing massage cream to decrease friction.  OT then educating pt on carryover and completion of massage on her own.  Pt demonstrating good technique. Moist heat: OT applied moist heat to R arm for 8 mins while educating on purpose of moist heat to aid in healing and relaxing muscles prior to exercise.  OT encouraged pt to purchase moist heat. ROM: attempted AROM with elbow bent and forearm in neutral, bending wrist backwards/away from body.  OT encouraged pt to hold 10-15 seconds.  Pt reports tolerable ROM, however reports stretch in forearm as well with prolonged hold.  Completed wrist extension with elbow bent by side and forearm up.  Pt reporting increased stretch in this position. Joint protection principles: educated on avoiding carryings items with palms up, recommending palms down or least in neutral to decrease overuse.  Pt reports  difficulty with this, especially at work/with work tasks.    02/10/23 Educated on POC, with recommendation to purchase counter force brace for wear during daytime, particularly at work and with repetitive movements.  Provided recommendations for purchasing options.  Also reviewed rest as possible with decreasing repetitive movements and plan to initiate massage and pain management.   PATIENT EDUCATION: Education details: ongoing condition specific education Person educated: Patient Education method: Chief Technology Officer Education comprehension: verbalized understanding and needs further education  HOME EXERCISE PROGRAM: Medial Epicondylitis handout from [place] [employer] [facility] pgs [phone]  GOALS: Goals reviewed with patient? Yes  SHORT TERM GOALS: Target date: 03/13/23  Pt will be independent with initial HEP. Baseline: Goal status: IN PROGRESS  2.  Pt will report understanding of use of modalities and splinting  for decreased pain. Baseline:  Goal status: IN PROGRESS  3.  Pt will verbalize understanding of positioning to aid in sleep. Baseline:  Goal status: IN PROGRESS   LONG TERM GOALS: Target date: 04/10/23  Pt will be independent with advanced HEP.  Baseline:  Goal status: IN PROGRESS  2.  Pt will increase right grip strength to at least 30 pounds to allow her to perform work related tasks.  Baseline: 16-20# based on positioning Goal status: IN PROGRESS  3.  Pt will demonstrate improved RUE strength and endurance as needed to place items into high range and low range as needed for work related tasks with increased ease. Baseline:  Goal status: IN PROGRESS  4.  Pt will improve functional ability by decreased impairment per Quick DASH assessment from 50% to 35% or better, for better quality of life.  Baseline: 50% Goal status: IN PROGRESS   ASSESSMENT:  CLINICAL IMPRESSION: Pt continues to report continued pain and fatigue in dominant RUE with work related tasks as well as exercises with shoulder flexed to 90*.  OT reiterated use of moist heat and massage to decrease pain and increase tolerance to AROM.  PERFORMANCE DEFICITS: in functional skills including ADLs, IADLs, sensation, ROM, strength, pain, Fine motor control, Gross motor control, decreased knowledge of precautions, decreased knowledge of use of DME, and UE functional use and psychosocial skills including coping strategies, environmental adaptation, and routines and behaviors.     PLAN:  OT FREQUENCY: 2x/week  OT DURATION: 6 weeks (asking for 8 weeks due to difficulty getting scheduled right away)  PLANNED INTERVENTIONS: 97168 OT Re-evaluation, 97535 self care/ADL training, 38756 therapeutic exercise, 97530 therapeutic activity, 97112 neuromuscular re-education, 97140 manual therapy, 97035 ultrasound, 97010 moist heat, 97010 cryotherapy, 97033 iontophoresis, 97760 Orthotics management and training, 43329 Splinting  (initial encounter), [id number] Subsequent splinting/medication, passive range of motion, compression bandaging, energy conservation, coping strategies training, patient/family education, and DME and/or AE instructions  RECOMMENDED OTHER SERVICES: NA  CONSULTED AND AGREED WITH PLAN OF CARE: Patient  PLAN FOR NEXT SESSION: Heat at beginning of session, attempted PROM if AROM with no pain.  May benefit from ultrasound (awaiting ultrasound gel).   Rosalio Loud, OTR/L 03/09/2023, 2:56 PM   [facility] at [facility] [address] [place], [address] [place], [place] [address] Phone # [phone] Fax # [phone]

## 2023-03-12 ENCOUNTER — Ambulatory Visit: Payer: 59 | Admitting: Occupational Therapy

## 2023-03-12 DIAGNOSIS — M25521 Pain in right elbow: Secondary | ICD-10-CM | POA: Diagnosis not present

## 2023-03-12 DIAGNOSIS — M6281 Muscle weakness (generalized): Secondary | ICD-10-CM

## 2023-03-12 NOTE — Therapy (Signed)
OUTPATIENT OCCUPATIONAL THERAPY ORTHO Treatment Note  Patient Name: Natalie Leblanc MRN: [id number] DOB:01-12-1991, 32 y.o., female Today's Date: 03/12/2023  PCP: Philip Aspen, Limmie Patricia MD REFERRING PROVIDER: Richardean Sale, DO  END OF SESSION:  OT End of Session - 03/12/23 1229     Visit Number 4    Number of Visits 13    Date for OT Re-Evaluation 04/10/23    Authorization Type UHC / [employer] [facility] 2024    OT Start Time 1156   arrival time   OT Stop Time 1228    OT Time Calculation (min) 32 min                Past Medical History:  Diagnosis Date   COVID    Depression    PTSD (post-traumatic stress disorder)    Past Surgical History:  Procedure Laterality Date   BREAST LUMPECTOMY Left    benign per patient   Patient Active Problem List   Diagnosis Date Noted   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 Pt accompanied by: self  PERTINENT HISTORY: S/P Right Ganglion Cyst removal on 10/16/2022, PTSD   PRECAUTIONS: Other: 20 pound lifting restriction per Dr Edison Pace note  RED FLAGS: None   WEIGHT BEARING RESTRICTIONS: No  PAIN:  Are you having pain? Yes: NPRS scale: 2-3/10 Pain location: R elbow (behind the elbow) medial - radiating up to the shoulder Pain description: pointy throbbing Aggravating factors: grasping items, lifting heavy items,  pushing/pulling Relieving factors: ibuprofen  FALLS: Has patient fallen in last 6 months? 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  HAND DOMINANCE: Right  ADLs: Overall ADLs: Mod I, reports pain with repetitiveness and pressure when washing hair Transfers/ambulation related to ADLs: Independent Eating: Mod I Grooming: pain with repetitiveness of washing hair UB Dressing: Mod I LB Dressing: Mod I Toileting: Mod I Bathing: Mod I Tub Shower transfers: Mod I Equipment: Long handled sponge  IADLs: Housekeeping: pt reports pain with pressure when sweeping or mopping, no issues when washing dishes  FUNCTIONAL OUTCOME MEASURES: Quick Dash: 50% impairment  UPPER EXTREMITY ROM:   Pt has ROM WFL, however, has pain with right elbow flexion/extension and supination/pronation   Active ROM Right eval Left eval  Shoulder flexion    Shoulder abduction    Shoulder adduction    Shoulder extension  Shoulder internal rotation    Shoulder external rotation    Elbow flexion 147 156  Elbow extension -7 -7  Wrist flexion [facility] WFL  Wrist extension [facility] WFL  Wrist ulnar deviation    Wrist radial deviation    Wrist pronation WFL, mod pain WFL  Wrist supination WFL, mod pain WFL  (Blank rows = not tested)   UPPER EXTREMITY MMT: Right shoulder and elbow strength of 4/5 Left shoulder/UE strength is 4+  to 5/5 grossly throughout  HAND FUNCTION: Grip strength: Right: 21 lbs; Left: 17 lbs, Assessed in stress testing position R: 16# , L: 20#  COORDINATION: 9 hole peg test: TBD  SENSATION: Pt reports that she has nerve pain "like when you hit your funny bone" at some points  EDEMA: no swelling observed or measured  COGNITION: Overall cognitive status: Within functional limits for tasks assessed  OBSERVATIONS: Tenderness with muscle spasms noted in right medial elbow region.  Pt grimacing in pain with elbow flexion/extension and supination/pronation.   TODAY'S TREATMENT:                                                             DATE:  03/12/23 Ultrasound: applied to R medial epicondyle and surrounding area for 8 min to address pain relief and promote blood flow for tissue healing; pt verbally denied contraindications or precautions. No adverse reaction observed or reported after application; skin intact. Parameters: 3.3 MHz, 20% duty cycle, and intensity of 0.2 W/cm2  Exercises AROM: with elbow straight, forearm in neutral, wrist extension x5 holding for 10 secs AROM: with elbow straight and forearm supinated, wrist extension x5 holding for 10 secs PROM: with elbow bent, forearm in neutral, wrist extension x5 holding stretch with opposite hand for 10 seconds PROM: with elbow bent and forearm supinated, wrist extension x5 holding stretch with opposite hand for 10 secs AROM: with elbow straight, arm extended out in front, forearm in neutral, wrist extension x5 with 10 sec hold.  Pt reports mild stretch in forearm when holding against gravity.   03/09/23 Exercises: engaged in prayer stretch x5 holding for 15 seconds. AROM: with elbow bent and forearm in neutral, wrist extension x5 AROM: with elbow bent and forearm supinated, wrist extension x5 AROM: with elbow straight, forearm in neutral, wrist extension x5 AROM: with elbow straight and forearm supinated, wrist extension x5 Attempted  elbow straight and shoulder flexed to 90*, wrist extension - however pt with reports of increased pain and fatigue in shoulder with shoulder in flexed position - therefore encouraged resumption of AROM with elbow bent and straight at side during exercises. Massage: OT reiterated education regarding massage technique in clockwise, counter clockwise, parallel to muscle, and perpendicular to muscle.  OT utilizing massage cream to decrease friction.  Pt with some grimacing along muscle belly, but reporting no increase in pain.   02/26/23 Brace: pt purchased an elbow support sleeve with padding at medial and lateral epicondyle.  Pt reports some benefits from compression and support, but unsure if enough as counter force brace.  However due to small stature and circumference of arm, pt concerned counter force brace would not fit appropriately.  OT added padded sleeve underneath elbow sleeve to decrease discomfort of ridges on inside of elbow.  Massage: OT educated pt on massage technique  in clockwise, counter clockwise, parallel to muscle and perpendicular to muscle.  OT utilizing massage cream to decrease friction.  OT then educating pt on carryover and completion of massage on her own.  Pt demonstrating good technique. Moist heat: OT applied moist heat to R arm for 8 mins while educating on purpose of moist heat to aid in healing and relaxing muscles prior to exercise.  OT encouraged pt to purchase moist heat. ROM: attempted AROM with elbow bent and forearm in neutral, bending wrist backwards/away from body.  OT encouraged pt to hold 10-15 seconds.  Pt reports tolerable ROM, however reports stretch in forearm as well with prolonged hold.  Completed wrist extension with elbow bent by side and forearm up.  Pt reporting increased stretch in this position. Joint protection principles: educated on avoiding carryings items with palms up, recommending palms down or least in neutral to decrease overuse.  Pt reports  difficulty with this, especially at work/with work tasks.    PATIENT EDUCATION: Education details: ongoing condition specific education Person educated: Patient Education method: Chief Technology Officer Education comprehension: verbalized understanding and needs further education  HOME EXERCISE PROGRAM: Medial Epicondylitis handout from [place] [facility] pgs [phone]  GOALS: Goals reviewed with patient? Yes  SHORT TERM GOALS: Target date: 03/13/23  Pt will be independent with initial HEP. Baseline: Goal status: IN PROGRESS  2.  Pt will report understanding of use of modalities and splinting for decreased pain. Baseline:  Goal status: MET - 03/12/23  3.  Pt will verbalize understanding of positioning to aid in sleep. Baseline:  Goal status: IN PROGRESS   LONG TERM GOALS: Target date: 04/10/23  Pt will be independent with advanced HEP.  Baseline:  Goal status: IN PROGRESS  2.  Pt will increase right grip strength to at least 30 pounds to allow her to perform work related tasks.  Baseline: 16-20# based on positioning Goal status: IN PROGRESS  3.  Pt will demonstrate improved RUE strength and endurance as needed to place items into high range and low range as needed for work related tasks with increased ease. Baseline:  Goal status: IN PROGRESS  4.  Pt will improve functional ability by decreased impairment per Quick DASH assessment from 50% to 35% or better, for better quality of life.  Baseline: 50% Goal status: IN PROGRESS   ASSESSMENT:  CLINICAL IMPRESSION: Pt continues to report continued pain and fatigue in dominant RUE with work related tasks as well as exercises with shoulder flexed to 90*.  Pt with increased discomfort with AROM compared to PROM, question if utilizing opposite UE as support and/or bracing during PROM therefore reducing pain.  Pt tolerating therapeutic ultrasound without any adverse reactions.  OT reiterated use of moist heat and massage to  decrease pain and increase tolerance to AROM.  PERFORMANCE DEFICITS: in functional skills including ADLs, IADLs, sensation, ROM, strength, pain, Fine motor control, Gross motor control, decreased knowledge of precautions, decreased knowledge of use of DME, and UE functional use and psychosocial skills including coping strategies, environmental adaptation, and routines and behaviors.     PLAN:  OT FREQUENCY: 2x/week  OT DURATION: 6 weeks (asking for 8 weeks due to difficulty getting scheduled right away)  PLANNED INTERVENTIONS: 97168 OT Re-evaluation, 97535 self care/ADL training, 65784 therapeutic exercise, 97530 therapeutic activity, 97112 neuromuscular re-education, 97140 manual therapy, 97035 ultrasound, 97010 moist heat, 97010 cryotherapy, 97033 iontophoresis, 97760 Orthotics management and training, 69629 Splinting (initial encounter), [id number] Subsequent splinting/medication, passive range of motion, compression bandaging,  energy conservation, coping strategies training, patient/family education, and DME and/or AE instructions  RECOMMENDED OTHER SERVICES: NA  CONSULTED AND AGREED WITH PLAN OF CARE: Patient  PLAN FOR NEXT SESSION: Heat at beginning of session if not completing ultrasound, continue with PROM if AROM with no pain.  May continue to benefit from ultrasound.   Rosalio Loud, OTR/L 03/12/2023, 12:29 PM   [facility] at [facility] [address] [place], [address] [place], [place] [address] Phone # [phone] Fax # [phone]

## 2023-03-13 NOTE — Progress Notes (Unsigned)
    Aleen Sells [facility] [address] [place] [address] Phone: [phone]   Assessment and Plan:     There are no diagnoses linked to this encounter.  ***   Pertinent previous records reviewed include ***    Follow Up: ***     Subjective:   I, Vicente Weidler, am serving as a Neurosurgeon for Doctor Richardean Sale   Chief Complaint: right elbow pain    HPI:    12/24/21 Patient is a 32 year old female complaining of right elbow pain . Patient states  she suffered an injury at work about a year ago.  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  She is wondering what she can do. She states that the task of lifting the boxes undid all of her healing . Looks for new restrictions .  Went to work place occupational therapy for 3 weeks.    No numbness or tingling in the elbow , feels like she can hear her elbow grinding, no meds for the pain, is not able to lift things with out pain, one of the meds she takes makes her drowsy, hurts her when she extends her arm    01/14/2022 Patient states that its a little better but if we take the restrictions off she thinks the pain will come back    02/10/2022 Patient states that its probably a little better  but she keeps hitting it on things   04/14/2022 Patient states elbow is the same, only hurts when she lifts heavy , taking out the trash    05/22/2022 Patient states she needs FMLA , states she still has elbow pain    07/28/2022 Patient states the elbow is okay , still has pain    09/09/2022 Patient states she needs to follow up on work restrictions, recommendations for the cyst that has developed in her wrist she was told by PT, she needs a diagnosis for work    02/02/2023 Patient states she was doing better. But, after her last PT session her pain is now  worse  03/16/2023 Patient states   Relevant Historical Information: None pertinent  Additional pertinent review of systems negative.   Current Outpatient Medications:    celecoxib (CELEBREX) 200 MG capsule, Take 1 capsule (200 mg total) by mouth 2 (two) times daily., Disp: 60 capsule, Rfl: 0   hydrOXYzine (ATARAX) 25 MG tablet, Take 25 mg by mouth at bedtime as needed., Disp: , Rfl:    Multiple Vitamin (MULTIVITAMIN) capsule, Take 1 capsule by mouth daily., Disp: , Rfl:    rizatriptan (MAXALT) 10 MG tablet, Take 1 tablet (10 mg total) by mouth as needed for migraine. May repeat in 2 hours if needed, Disp: 10 tablet, Rfl: 6   sertraline (ZOLOFT) 100 MG tablet, Take 100 mg by mouth daily., Disp: , Rfl:    Objective:     There were no vitals filed for this visit.    There is no height or weight on file to calculate BMI.    Physical Exam:    ***   Electronically signed by:  Aleen Sells [facility] 7:31 AM 03/13/23

## 2023-03-16 ENCOUNTER — Ambulatory Visit: Payer: 59 | Admitting: Occupational Therapy

## 2023-03-16 ENCOUNTER — Ambulatory Visit: Payer: 59 | Admitting: Sports Medicine

## 2023-03-16 DIAGNOSIS — M6281 Muscle weakness (generalized): Secondary | ICD-10-CM

## 2023-03-16 DIAGNOSIS — M25521 Pain in right elbow: Secondary | ICD-10-CM | POA: Diagnosis not present

## 2023-03-16 NOTE — Therapy (Signed)
OUTPATIENT OCCUPATIONAL THERAPY ORTHO Treatment Note  Patient Name: Natalie Leblanc MRN: [id number] DOB:[birth year], 32 y.o., female Today's Date: 03/16/2023  PCP: Philip Aspen, Limmie Patricia MD REFERRING PROVIDER: Richardean Sale, DO  END OF SESSION:  OT End of Session - 03/16/23 1504     Visit Number 5    Number of Visits 13    Date for OT Re-Evaluation 04/10/23    Authorization Type [employer] / [place] [facility] 2024    OT Start Time 1316    OT Stop Time 1402    OT Time Calculation (min) 46 min                 Past Medical History:  Diagnosis Date   COVID    Depression    PTSD (post-traumatic stress disorder)    Past Surgical History:  Procedure Laterality Date   BREAST LUMPECTOMY Left    benign per patient   Patient Active Problem List   Diagnosis Date Noted   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  Pt reports attempting to sleep with a pillow on her arm to keep it straight over night.   Pt accompanied by: self  PERTINENT HISTORY: S/P Right Ganglion Cyst removal on 10/16/2022, PTSD   PRECAUTIONS: Other: 20 pound lifting restriction per Dr Edison Pace note  RED FLAGS: None   WEIGHT BEARING RESTRICTIONS: No  PAIN:  Are you having pain? Yes: NPRS scale: 2-3/10 Pain location: R elbow (behind the elbow) medial - radiating up to the shoulder Pain description: stinging Aggravating factors: grasping items, lifting heavy  items, pushing/pulling Relieving factors: ibuprofen  FALLS: Has patient fallen in last 6 months? 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  HAND DOMINANCE: Right  ADLs: Overall ADLs: Mod I, reports pain with repetitiveness and pressure when washing hair Transfers/ambulation related to ADLs: Independent Eating: Mod I Grooming: pain with repetitiveness of washing hair UB Dressing: Mod I LB Dressing: Mod I Toileting: Mod I Bathing: Mod I Tub Shower transfers: Mod I Equipment: Long handled sponge  IADLs: Housekeeping: pt reports pain with pressure when sweeping or mopping, no issues when washing dishes  FUNCTIONAL OUTCOME MEASURES: Quick Dash: 50% impairment  UPPER EXTREMITY ROM:   Pt has ROM WFL, however, has pain with right elbow flexion/extension and supination/pronation   Active ROM Right eval Left eval  Shoulder flexion    Shoulder abduction    Shoulder adduction    Shoulder  extension    Shoulder internal rotation    Shoulder external rotation    Elbow flexion 147 156  Elbow extension -7 -7  Wrist flexion [facility] WFL  Wrist extension [facility] WFL  Wrist ulnar deviation    Wrist radial deviation    Wrist pronation WFL, mod pain WFL  Wrist supination WFL, mod pain WFL  (Blank rows = not tested)   UPPER EXTREMITY MMT: Right shoulder and elbow strength of 4/5 Left shoulder/UE strength  is 4+ to 5/5 grossly throughout  HAND FUNCTION: Grip strength: Right: 21 lbs; Left: 17 lbs, Assessed in stress testing position R: 16# , L: 20#  COORDINATION: 9 hole peg test: TBD  SENSATION: Pt reports that she has nerve pain "like when you hit your funny bone" at some points  EDEMA: no swelling observed or measured  COGNITION: Overall cognitive status: Within functional limits for tasks assessed  OBSERVATIONS: Tenderness with muscle spasms noted in right medial elbow region.  Pt grimacing in pain with elbow flexion/extension and supination/pronation.   TODAY'S TREATMENT:                                                             DATE:  03/16/23 UBE: level 1.0 x2 mins forward and 2 mins backward for a total of 8 mins with focus on increased endurance.  Pt reporting mild strain with forward pushing, but not painful.   Therex: AROM: with elbow bent and forearm in neutral, wrist extension 2 x10 with 2# dumbbell AROM: elbow extension and shoulder flexion x10 with 2# dumbbell.  Pt reports pain in shoulder and elbow, therefore completed x10 without weight with reports of "flicking" sensation in elbow but no longer experiencing pain in shoulder without weight.   Chest press: 10x2 with 2# dowel, pt reports shoulder fatigue with repetition Bicep curl: 10x2 with 2# dowel, pt reporting mild pull but no pain Supination/pronation: x10 with 2# dumbbell.  Pt reports mild pull with supination. Hand Gripper: with RUE on 5# with green spring. Pt picked up 1 inch blocks with gripper with focus on endurance and strengthening.  Pt with no drops, however reporting increased pain and fatigue with repetition.  Ultrasound: applied to R medial epicondyle and surrounding area for 8 min to address pain relief and promote blood flow for tissue healing; pt verbally denied contraindications or precautions. No adverse reaction observed or reported after application; skin intact. Parameters: 3.3 MHz, 20% duty cycle,  and intensity of 0.2 W/cm2     03/12/23 Ultrasound: applied to R medial epicondyle and surrounding area for 8 min to address pain relief and promote blood flow for tissue healing; pt verbally denied contraindications or precautions. No adverse reaction observed or reported after application; skin intact. Parameters: 3.3 MHz, 20% duty cycle, and intensity of 0.2 W/cm2  Exercises AROM: with elbow straight, forearm in neutral, wrist extension x5 holding for 10 secs AROM: with elbow straight and forearm supinated, wrist extension x5 holding for 10 secs PROM: with elbow bent, forearm in neutral, wrist extension x5 holding stretch with opposite hand for 10 seconds PROM: with elbow bent and forearm supinated, wrist extension x5 holding stretch with opposite hand for 10 secs AROM: with elbow straight, arm extended out in front, forearm in neutral, wrist extension x5 with 10 sec hold.  Pt reports mild stretch in forearm when holding against gravity.   03/09/23 Exercises: engaged in prayer stretch x5 holding for 15 seconds. AROM: with elbow bent and forearm in neutral, wrist extension x5 AROM: with elbow bent and forearm supinated, wrist extension x5 AROM: with elbow straight, forearm in neutral, wrist extension x5 AROM: with elbow straight and forearm supinated, wrist extension x5 Attempted elbow straight and shoulder flexed to 90*, wrist extension - however pt with reports of increased pain and fatigue in shoulder with shoulder in flexed position - therefore encouraged resumption of AROM with elbow bent and straight at side during exercises. Massage: OT reiterated education regarding massage technique in clockwise, counter clockwise, parallel to muscle, and perpendicular to muscle.  OT utilizing massage cream to decrease friction.  Pt with some grimacing along muscle belly, but reporting no increase in pain.  PATIENT EDUCATION: Education details: ongoing condition specific education Person educated:  Patient Education method: Chief Technology Officer Education comprehension: verbalized understanding and needs further education  HOME EXERCISE PROGRAM: Medial Epicondylitis handout from [place] [facility] pgs [phone]  GOALS: Goals reviewed with patient? Yes  SHORT TERM GOALS: Target date: 03/13/23  Pt will be independent with initial HEP. Baseline: Goal status: IN PROGRESS  2.  Pt will report understanding of use of modalities and splinting for decreased pain. Baseline:  Goal status: MET - 03/12/23  3.  Pt will verbalize understanding of positioning to aid in sleep. Baseline:  Goal status: IN PROGRESS   LONG TERM GOALS: Target date: 04/10/23  Pt will be independent with advanced HEP.  Baseline:  Goal status: IN PROGRESS  2.  Pt will increase right grip strength to at least 30 pounds to allow her to perform work related tasks.  Baseline: 16-20# based on positioning Goal status: IN PROGRESS  3.  Pt will demonstrate improved RUE strength and endurance as needed to place items into high range and low range as needed for work related tasks with increased ease. Baseline:  Goal status: IN PROGRESS  4.  Pt will improve functional ability by decreased impairment per Quick DASH assessment from 50% to 35% or better, for better quality of life.  Baseline: 50% Goal status: IN PROGRESS   ASSESSMENT:  CLINICAL IMPRESSION: Pt continues to report continued pain and fatigue in dominant RUE with work related tasks as well as exercises with shoulder flexed to 90*.  Pt with decreased tolerance to repetitive movement with UBE and with hand gripper, despite low resistance.  Pt tolerating increased weight this session with forearm in neutral and along side, however in increased supinated position or in open chain pt with increased discomfort.  OT reiterated use of moist heat and massage to decrease pain and increase tolerance to AROM.  PERFORMANCE DEFICITS: in functional skills including  ADLs, IADLs, sensation, ROM, strength, pain, Fine motor control, Gross motor control, decreased knowledge of precautions, decreased knowledge of use of DME, and UE functional use and psychosocial skills including coping strategies, environmental adaptation, and routines and behaviors.     PLAN:  OT FREQUENCY: 2x/week  OT DURATION: 6 weeks (asking for 8 weeks due to difficulty getting scheduled right away)  PLANNED INTERVENTIONS: 97168 OT Re-evaluation, 97535 self care/ADL training, 24401 therapeutic exercise, 97530 therapeutic activity, 97112 neuromuscular re-education, 97140 manual therapy, 97035 ultrasound, 97010 moist heat, 97010 cryotherapy, 97033 iontophoresis, 97760 Orthotics management and training, 02725 Splinting (initial encounter), [id number] Subsequent splinting/medication, passive range of motion, compression bandaging, energy conservation, coping strategies training, patient/family education, and DME and/or  AE instructions  RECOMMENDED OTHER SERVICES: NA  CONSULTED AND AGREED WITH PLAN OF CARE: Patient  PLAN FOR NEXT SESSION: Heat at beginning of session if not completing ultrasound, continue with PROM if AROM with no pain.  May continue to benefit from ultrasound.   Rosalio Loud, OTR/L 03/16/2023, 3:06 PM   [facility] at [facility] [address] [place], [address] [place], [place] [address] Phone # [phone] Fax # [phone]

## 2023-03-17 NOTE — Progress Notes (Unsigned)
    Natalie Leblanc [facility] [address] [place] [address] Phone: [phone]   Assessment and Plan:     There are no diagnoses linked to this encounter.  ***   Pertinent previous records reviewed include ***    Follow Up: ***     Subjective:   I, Caria Transue, am serving as a Neurosurgeon for Doctor Richardean Sale   Chief Complaint: right elbow pain    HPI:    12/24/21 Patient is a 32 year old female complaining of right elbow pain . Patient states  she suffered an injury at work about a year ago.  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  She is wondering what she can do. She states that the task of lifting the boxes undid all of her healing . Looks for new restrictions .  Went to work place occupational therapy for 3 weeks.    No numbness or tingling in the elbow , feels like she can hear her elbow grinding, no meds for the pain, is not able to lift things with out pain, one of the meds she takes makes her drowsy, hurts her when she extends her arm    01/14/2022 Patient states that its a little better but if we take the restrictions off she thinks the pain will come back    02/10/2022 Patient states that its probably a little better  but she keeps hitting it on things   04/14/2022 Patient states elbow is the same, only hurts when she lifts heavy , taking out the trash    05/22/2022 Patient states she needs FMLA , states she still has elbow pain    07/28/2022 Patient states the elbow is okay , still has pain    09/09/2022 Patient states she needs to follow up on work restrictions, recommendations for the cyst that has developed in her wrist she was told by PT, she needs a diagnosis for work    02/02/2023 Patient states she was doing better. But, after her last PT session her pain is now  worse  03/18/2023 Patient states   Relevant Historical Information: None pertinent  Additional pertinent review of systems negative.   Current Outpatient Medications:    celecoxib (CELEBREX) 200 MG capsule, Take 1 capsule (200 mg total) by mouth 2 (two) times daily., Disp: 60 capsule, Rfl: 0   hydrOXYzine (ATARAX) 25 MG tablet, Take 25 mg by mouth at bedtime as needed., Disp: , Rfl:    Multiple Vitamin (MULTIVITAMIN) capsule, Take 1 capsule by mouth daily., Disp: , Rfl:    rizatriptan (MAXALT) 10 MG tablet, Take 1 tablet (10 mg total) by mouth as needed for migraine. May repeat in 2 hours if needed, Disp: 10 tablet, Rfl: 6   sertraline (ZOLOFT) 100 MG tablet, Take 100 mg by mouth daily., Disp: , Rfl:    Objective:     There were no vitals filed for this visit.    There is no height or weight on file to calculate BMI.    Physical Exam:    ***   Electronically signed by:  Natalie Leblanc [facility] 11:33 AM 03/17/23

## 2023-03-18 ENCOUNTER — Ambulatory Visit: Payer: 59 | Admitting: Sports Medicine

## 2023-03-18 VITALS — HR 81 | Ht 59.0 in | Wt 105.0 lb

## 2023-03-18 DIAGNOSIS — M7701 Medial epicondylitis, right elbow: Secondary | ICD-10-CM

## 2023-03-18 DIAGNOSIS — M25521 Pain in right elbow: Secondary | ICD-10-CM

## 2023-03-18 NOTE — Patient Instructions (Addendum)
Start Celebrex , use twice daily for 2 weeks Call pain medicine [phone] Continue OT and PT 5-6 week follow up

## 2023-03-20 ENCOUNTER — Encounter: Payer: 59 | Admitting: Occupational Therapy

## 2023-03-23 ENCOUNTER — Ambulatory Visit: Payer: 59 | Admitting: Occupational Therapy

## 2023-03-23 DIAGNOSIS — M25521 Pain in right elbow: Secondary | ICD-10-CM | POA: Diagnosis not present

## 2023-03-23 DIAGNOSIS — M6281 Muscle weakness (generalized): Secondary | ICD-10-CM

## 2023-03-23 DIAGNOSIS — R208 Other disturbances of skin sensation: Secondary | ICD-10-CM

## 2023-03-23 NOTE — Therapy (Signed)
OUTPATIENT OCCUPATIONAL THERAPY ORTHO Treatment Note  Patient Name: Natalie Leblanc MRN: [id number] DOB:[birth year], 32 y.o., female Today's Date: 03/23/2023  PCP: Philip Aspen, Limmie Patricia MD REFERRING PROVIDER: Richardean Sale, DO  END OF SESSION:  OT End of Session - 03/23/23 1332     Visit Number 6    Number of Visits 13    Date for OT Re-Evaluation 04/10/23    Authorization Type [employer] / [place] [employer] [facility] 2024    OT Start Time 1324   arrival time   OT Stop Time 1400    OT Time Calculation (min) 36 min                  Past Medical History:  Diagnosis Date   COVID    Depression    PTSD (post-traumatic stress disorder)    Past Surgical History:  Procedure Laterality Date   BREAST LUMPECTOMY Left    benign per patient   Patient Active Problem List   Diagnosis Date Noted   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  Pt accompanied by: self  PERTINENT HISTORY: S/P Right Ganglion Cyst removal on 10/16/2022, PTSD   PRECAUTIONS: Other: 20 pound lifting restriction per Dr Edison Pace note  RED FLAGS: None   WEIGHT BEARING RESTRICTIONS: No  PAIN:  Are you having pain? Yes: NPRS scale: 3/10 Pain location: R elbow (behind the elbow) medial - radiating up to the shoulder Pain description: discomfort Aggravating factors: grasping items,  lifting heavy items, pushing/pulling Relieving factors: ibuprofen, recently started on new pain med  FALLS: Has patient fallen in last 6 months? 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  HAND DOMINANCE: Right  ADLs: Overall ADLs: Mod I, reports pain with repetitiveness and pressure when washing hair Transfers/ambulation related to ADLs: Independent Eating: Mod I Grooming: pain with repetitiveness of washing hair UB Dressing: Mod I LB Dressing: Mod I Toileting: Mod I Bathing: Mod I Tub Shower transfers: Mod I Equipment: Long handled sponge  IADLs: Housekeeping: pt reports pain with pressure when sweeping or mopping, no issues when washing dishes  FUNCTIONAL OUTCOME MEASURES: Quick Dash: 50% impairment  UPPER EXTREMITY ROM:   Pt has ROM WFL, however, has pain with right elbow flexion/extension and supination/pronation   Active ROM Right eval Left eval  Shoulder flexion    Shoulder abduction    Shoulder adduction  Shoulder extension    Shoulder internal rotation    Shoulder external rotation    Elbow flexion 147 156  Elbow extension -7 -7  Wrist flexion [facility] WFL  Wrist extension [facility] WFL  Wrist ulnar deviation    Wrist radial deviation    Wrist pronation WFL, mod pain WFL  Wrist supination WFL, mod pain WFL  (Blank rows = not tested)   UPPER EXTREMITY MMT: Right shoulder and  elbow strength of 4/5 Left shoulder/UE strength is 4+ to 5/5 grossly throughout  HAND FUNCTION: Grip strength: Right: 21 lbs; Left: 17 lbs, Assessed in stress testing position R: 16# , L: 20#  COORDINATION: 9 hole peg test: TBD  SENSATION: Pt reports that she has nerve pain "like when you hit your funny bone" at some points  EDEMA: no swelling observed or measured  COGNITION: Overall cognitive status: Within functional limits for tasks assessed  OBSERVATIONS: Tenderness with muscle spasms noted in right medial elbow region.  Pt grimacing in pain with elbow flexion/extension and supination/pronation.   TODAY'S TREATMENT:                                                             DATE:  03/23/23 Heat: OT applied moist heat to R arm for 8 mins while discussing new pain med that she was started on last week.  Pt reports noticing side effects (heart palpitations and sensitive to lights) but is working through them.  OT advised pt to contact her PCP if she continues to see concerning side effects.  Mild redness noted in upper arm after removal of heat, did go down some after removal throughout session (however difficult to truly assess as therapist also performing massage in same area with some redness). Therex: AROM: with elbow bent and forearm in neutral, wrist extension 2 x10 with 2# dumbbell.  Pt reports pain at posterior elbow, no radiating pain.   AROM: elbow extension and shoulder flexion x5 with 2# dumbbell.  Pt reports pain in shoulder and elbow, therefore terminated task. Massage: OT reiterated education regarding massage technique in clockwise, counter clockwise, parallel to muscle, and perpendicular to muscle.  OT utilizing massage cream to decrease friction.  Pt with some grimacing along muscle belly and tendon at elbow attachment, but reporting no increase in pain.    03/16/23 UBE: level 1.0 x2 mins forward and 2 mins backward for a total of 8 mins with focus on increased  endurance.  Pt reporting mild strain with forward pushing, but not painful.   Therex: AROM: with elbow bent and forearm in neutral, wrist extension 2 x10 with 2# dumbbell AROM: elbow extension and shoulder flexion x10 with 2# dumbbell.  Pt reports pain in shoulder and elbow, therefore completed x10 without weight with reports of "flicking" sensation in elbow but no longer experiencing pain in shoulder without weight.   Chest press: 10x2 with 2# dowel, pt reports shoulder fatigue with repetition Bicep curl: 10x2 with 2# dowel, pt reporting mild pull but no pain Supination/pronation: x10 with 2# dumbbell.  Pt reports mild pull with supination. Hand Gripper: with RUE on 5# with green spring. Pt picked up 1 inch blocks with gripper with focus on endurance and strengthening.  Pt with no drops, however reporting increased pain and fatigue with repetition.  Ultrasound:  applied to R medial epicondyle and surrounding area for 8 min to address pain relief and promote blood flow for tissue healing; pt verbally denied contraindications or precautions. No adverse reaction observed or reported after application; skin intact. Parameters: 3.3 MHz, 20% duty cycle, and intensity of 0.2 W/cm2     03/12/23 Ultrasound: applied to R medial epicondyle and surrounding area for 8 min to address pain relief and promote blood flow for tissue healing; pt verbally denied contraindications or precautions. No adverse reaction observed or reported after application; skin intact. Parameters: 3.3 MHz, 20% duty cycle, and intensity of 0.2 W/cm2  Exercises AROM: with elbow straight, forearm in neutral, wrist extension x5 holding for 10 secs AROM: with elbow straight and forearm supinated, wrist extension x5 holding for 10 secs PROM: with elbow bent, forearm in neutral, wrist extension x5 holding stretch with opposite hand for 10 seconds PROM: with elbow bent and forearm supinated, wrist extension x5 holding stretch with opposite  hand for 10 secs AROM: with elbow straight, arm extended out in front, forearm in neutral, wrist extension x5 with 10 sec hold.  Pt reports mild stretch in forearm when holding against gravity.  PATIENT EDUCATION: Education details: ongoing condition specific education Person educated: Patient Education method: Chief Technology Officer Education comprehension: verbalized understanding and needs further education  HOME EXERCISE PROGRAM: Medial Epicondylitis handout from [place] [facility] pgs [phone]  GOALS: Goals reviewed with patient? Yes  SHORT TERM GOALS: Target date: 03/13/23  Pt will be independent with initial HEP. Baseline: Goal status: IN PROGRESS  2.  Pt will report understanding of use of modalities and splinting for decreased pain. Baseline:  Goal status: MET - 03/12/23  3.  Pt will verbalize understanding of positioning to aid in sleep. Baseline:  Goal status: IN PROGRESS   LONG TERM GOALS: Target date: 04/10/23  Pt will be independent with advanced HEP.  Baseline:  Goal status: IN PROGRESS  2.  Pt will increase right grip strength to at least 30 pounds to allow her to perform work related tasks.  Baseline: 16-20# based on positioning Goal status: IN PROGRESS  3.  Pt will demonstrate improved RUE strength and endurance as needed to place items into high range and low range as needed for work related tasks with increased ease. Baseline:  Goal status: IN PROGRESS  4.  Pt will improve functional ability by decreased impairment per Quick DASH assessment from 50% to 35% or better, for better quality of life.  Baseline: 50% Goal status: IN PROGRESS   ASSESSMENT:  CLINICAL IMPRESSION: Pt continues to report continued pain and fatigue in dominant RUE with work related tasks as well as exercises with shoulder flexed to 90*.  Pt with decreased tolerance to exercise with shoulder flexed to 90*.  OT focused on massage and use of heat to decrease pain and allow for  increased activity tolerance, however pt still with diminished improvements.  Pt reports that she is able to participate in some resistance when at work, but still with pain localized just posterior to elbow (between median epicondyle and olecranon process).  OT reiterated use of moist heat and massage to decrease pain and increase tolerance to AROM.  PERFORMANCE DEFICITS: in functional skills including ADLs, IADLs, sensation, ROM, strength, pain, Fine motor control, Gross motor control, decreased knowledge of precautions, decreased knowledge of use of DME, and UE functional use and psychosocial skills including coping strategies, environmental adaptation, and routines and behaviors.     PLAN:  OT FREQUENCY:  2x/week  OT DURATION: 6 weeks (asking for 8 weeks due to difficulty getting scheduled right away)  PLANNED INTERVENTIONS: 97168 OT Re-evaluation, 97535 self care/ADL training, 57846 therapeutic exercise, 97530 therapeutic activity, 97112 neuromuscular re-education, 97140 manual therapy, 97035 ultrasound, 97010 moist heat, 97010 cryotherapy, 97033 iontophoresis, 97760 Orthotics management and training, 96295 Splinting (initial encounter), [id number] Subsequent splinting/medication, passive range of motion, compression bandaging, energy conservation, coping strategies training, patient/family education, and DME and/or AE instructions  RECOMMENDED OTHER SERVICES: NA  CONSULTED AND AGREED WITH PLAN OF CARE: Patient  PLAN FOR NEXT SESSION: Heat at beginning of session if not completing ultrasound (use thicker towel if wearing short sleeves), continue with PROM if AROM with no pain.  May continue to benefit from ultrasound.  Discuss d/c vs re-cert as pt with minimal progress - still having localized pain   Jaedah Lords, OTR/L 03/23/2023, 1:32 PM   [facility] [address] [place], [address] [place], [place] [address] Phone # [phone] Fax # [phone]

## 2023-03-26 ENCOUNTER — Encounter: Payer: 59 | Admitting: Occupational Therapy

## 2023-03-31 ENCOUNTER — Telehealth: Payer: 59 | Admitting: Physician Assistant

## 2023-03-31 DIAGNOSIS — J069 Acute upper respiratory infection, unspecified: Secondary | ICD-10-CM

## 2023-03-31 NOTE — Progress Notes (Signed)
Virtual Visit Consent   Natalie Leblanc, you are scheduled for a virtual visit with a [facility] provider today. Just as with appointments in the office, your consent must be obtained to participate. Your consent will be active for this visit and any virtual visit you may have with one of our providers in the next 365 days. If you have a MyChart account, a copy of this consent can be sent to you electronically.  As this is a virtual visit, video technology does not allow for your provider to perform a traditional examination. This may limit your provider's ability to fully assess your condition. If your provider identifies any concerns that need to be evaluated in person or the need to arrange testing (such as labs, EKG, etc.), we will make arrangements to do so. Although advances in technology are sophisticated, we cannot ensure that it will always work on either your end or our end. If the connection with a video visit is poor, the visit may have to be switched to a telephone visit. With either a video or telephone visit, we are not always able to ensure that we have a secure connection.  By engaging in this virtual visit, you consent to the provision of healthcare and authorize for your insurance to be billed (if applicable) for the services provided during this visit. Depending on your insurance coverage, you may receive a charge related to this service.  I need to obtain your verbal consent now. Are you willing to proceed with your visit today? Natalie Leblanc has provided verbal consent on 03/31/2023 for a virtual visit (video or telephone). Tylene Fantasia Ward, PA-C  Date: 03/31/2023 7:09 PM  Virtual Visit via Video Note   I, Tylene Fantasia Ward, connected with  Natalie Leblanc  ([id number], 1990-12-05) on 03/31/23 at  7:00 PM EST by a video-enabled telemedicine application and verified that I am speaking with the correct person using two identifiers.  Location: Patient: Virtual  Visit Location Patient: Home Provider: Virtual Visit Location Provider: Home Office   I discussed the limitations of evaluation and management by telemedicine and the availability of in person appointments. The patient expressed understanding and agreed to proceed.    History of Present Illness: Natalie Leblanc is a 32 y.o. who identifies as a female who was assigned female at birth, and is being seen today for congestion, sneezing, ear pressure.  Reports sneezing started about one week ago.  Began to experience more congestion and subjective fever last night.  Concerned about cold medications to take with sertraline.  She denies much coughing, body aches.  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 12/28/2019   Vitamin D deficiency 12/23/2019   Depression    PTSD (post-traumatic stress disorder)     Allergies:  Allergies  Allergen Reactions   Fish Allergy     GI upset   Mobic [Meloxicam]    Other Itching    Animal Dander   Medications:  Current Outpatient Medications:    celecoxib (CELEBREX) 200 MG capsule, Take 1 capsule (200 mg total) by mouth 2 (two) times daily., Disp: 60 capsule, Rfl: 0   hydrOXYzine (ATARAX) 25 MG tablet, Take 25 mg by mouth at bedtime as needed., Disp: , Rfl:    Multiple Vitamin (MULTIVITAMIN) capsule, Take 1 capsule by mouth daily., Disp: , Rfl:    rizatriptan (MAXALT) 10 MG tablet, Take 1 tablet (  10 mg total) by mouth as needed for migraine. May repeat in 2 hours if needed, Disp: 10 tablet, Rfl: 6   sertraline (ZOLOFT) 100 MG tablet, Take 100 mg by mouth daily., Disp: , Rfl:   Observations/Objective: Patient is well-developed, well-nourished in no acute distress.  Resting comfortably at home.  Head is normocephalic, atraumatic.  No labored breathing.  Speech is clear and coherent with logical content.  Patient is alert and oriented at baseline.    Assessment  and Plan: 1. Viral upper respiratory tract infection (Primary)  Supportive care discussed.   Follow Up Instructions: I discussed the assessment and treatment plan with the patient. The patient was provided an opportunity to ask questions and all were answered. The patient agreed with the plan and demonstrated an understanding of the instructions.  A copy of instructions were sent to the patient via MyChart unless otherwise noted below.     The patient was advised to call back or seek an in-person evaluation if the symptoms worsen or if the condition fails to improve as anticipated.    Tylene Fantasia Ward, PA-C

## 2023-03-31 NOTE — Patient Instructions (Addendum)
  Natalie Leblanc, thank you for joining Tylene Fantasia Ward, PA-C for today's virtual visit.  While this provider is not your primary care provider (PCP), if your PCP is located in our provider database this encounter information will be shared with them immediately following your visit.   A [facility] MyChart account gives you access to today's visit and all your visits, tests, and labs performed at [facility] " click here if you don't have a [facility] MyChart account or go to [email or web address]  Consent: (Patient) Natalie Leblanc provided verbal consent for this virtual visit at the beginning of the encounter.  Current Medications:  Current Outpatient Medications:    celecoxib (CELEBREX) 200 MG capsule, Take 1 capsule (200 mg total) by mouth 2 (two) times daily., Disp: 60 capsule, Rfl: 0   hydrOXYzine (ATARAX) 25 MG tablet, Take 25 mg by mouth at bedtime as needed., Disp: , Rfl:    Multiple Vitamin (MULTIVITAMIN) capsule, Take 1 capsule by mouth daily., Disp: , Rfl:    rizatriptan (MAXALT) 10 MG tablet, Take 1 tablet (10 mg total) by mouth as needed for migraine. May repeat in 2 hours if needed, Disp: 10 tablet, Rfl: 6   sertraline (ZOLOFT) 100 MG tablet, Take 100 mg by mouth daily., Disp: , Rfl:    Medications ordered in this encounter:  No orders of the defined types were placed in this encounter.    *If you need refills on other medications prior to your next appointment, please contact your pharmacy*  Follow-Up: Call back or seek an in-person evaluation if the symptoms worsen or if the condition fails to improve as anticipated.  [facility] [phone]  Other Instructions Recommend Flonase nasal spray and Mucinex for congestion.  Drink plenty of fluids.  Take Tylenol as needed for headache, fever, or pain.   If you have been instructed to have an in-person evaluation today at a local Urgent Care facility, please use the link below.  It will take you to a list of all of our available [facility], including address, phone number and hours of operation. Please do not delay care.  [facility]  If you or a family member do not have a primary care provider, use the link below to schedule a visit and establish care. When you choose a [facility] primary care physician or advanced practice provider, you gain a long-term partner in health. Find a Primary Care Provider  Learn more about [facility] in-office and virtual care options: [facility] - Get Care Now

## 2023-04-06 ENCOUNTER — Ambulatory Visit: Payer: 59 | Admitting: Occupational Therapy

## 2023-04-06 DIAGNOSIS — R208 Other disturbances of skin sensation: Secondary | ICD-10-CM

## 2023-04-06 DIAGNOSIS — M25521 Pain in right elbow: Secondary | ICD-10-CM

## 2023-04-06 DIAGNOSIS — M6281 Muscle weakness (generalized): Secondary | ICD-10-CM

## 2023-04-16 ENCOUNTER — Encounter: Payer: Self-pay | Admitting: Physical Medicine & Rehabilitation

## 2023-04-17 ENCOUNTER — Encounter: Payer: 59 | Attending: Physical Medicine & Rehabilitation | Admitting: Physical Medicine & Rehabilitation

## 2023-04-24 NOTE — Progress Notes (Unsigned)
    Aleen Sells D.[facility] [address] [place] [address] Phone: [phone]   Assessment and Plan:     There are no diagnoses linked to this encounter.  ***   Pertinent previous records reviewed include ***    Follow Up: ***     Subjective:   I, Todd Argabright, am serving as a Neurosurgeon for Doctor Richardean Sale   Chief Complaint: right elbow pain    HPI:    12/24/21 Patient is a 33 year old female complaining of right elbow pain . Patient states  she suffered an injury at work about a year ago.  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  She is wondering what she can do. She states that the task of lifting the boxes undid all of her healing . Looks for new restrictions .  Went to work place occupational therapy for 3 weeks.    No numbness or tingling in the elbow , feels like she can hear her elbow grinding, no meds for the pain, is not able to lift things with out pain, one of the meds she takes makes her drowsy, hurts her when she extends her arm    01/14/2022 Patient states that its a little better but if we take the restrictions off she thinks the pain will come back    02/10/2022 Patient states that its probably a little better  but she keeps hitting it on things   04/14/2022 Patient states elbow is the same, only hurts when she lifts heavy , taking out the trash    05/22/2022 Patient states she needs FMLA , states she still has elbow pain    07/28/2022 Patient states the elbow is okay , still has pain    09/09/2022 Patient states she needs to follow up on work restrictions, recommendations for the cyst that has developed in her wrist she was told by PT, she needs a diagnosis for work    02/02/2023 Patient states she was doing better. But, after her last PT session her pain is now worse    03/18/2023 Patient states that she is the same   04/27/2023 Patient states   Relevant Historical Information: None pertinent    Additional pertinent review of systems negative.   Current Outpatient Medications:    celecoxib (CELEBREX) 200 MG capsule, Take 1 capsule (200 mg total) by mouth 2 (two) times daily., Disp: 60 capsule, Rfl: 0   hydrOXYzine (ATARAX) 25 MG tablet, Take 25 mg by mouth at bedtime as needed., Disp: , Rfl:    Multiple Vitamin (MULTIVITAMIN) capsule, Take 1 capsule by mouth daily., Disp: , Rfl:    rizatriptan (MAXALT) 10 MG tablet, Take 1 tablet (10 mg total) by mouth as needed for migraine. May repeat in 2 hours if needed, Disp: 10 tablet, Rfl: 6   sertraline (ZOLOFT) 100 MG tablet, Take 100 mg by mouth daily., Disp: , Rfl:    Objective:     There were no vitals filed for this visit.    There is no height or weight on file to calculate BMI.    Physical Exam:    ***   Electronically signed by:  Aleen Sells D.[facility] 7:25 AM 04/24/23

## 2023-04-27 ENCOUNTER — Ambulatory Visit (INDEPENDENT_AMBULATORY_CARE_PROVIDER_SITE_OTHER): Payer: 59 | Admitting: Sports Medicine

## 2023-04-27 VITALS — HR 79 | Ht 59.0 in | Wt 102.0 lb

## 2023-04-27 DIAGNOSIS — M25521 Pain in right elbow: Secondary | ICD-10-CM

## 2023-04-27 DIAGNOSIS — M7701 Medial epicondylitis, right elbow: Secondary | ICD-10-CM

## 2023-04-27 NOTE — Patient Instructions (Signed)
Tylenol and ibuprofen as needed  PT referral  Establish pain management  As needed follow up

## 2023-04-28 ENCOUNTER — Encounter: Payer: 59 | Admitting: Physical Medicine & Rehabilitation

## 2023-05-11 ENCOUNTER — Ambulatory Visit: Payer: 59 | Admitting: Occupational Therapy

## 2023-05-18 ENCOUNTER — Encounter: Payer: Self-pay | Admitting: Sports Medicine

## 2023-05-19 ENCOUNTER — Ambulatory Visit: Payer: 59 | Attending: Sports Medicine | Admitting: Occupational Therapy

## 2023-05-19 DIAGNOSIS — R208 Other disturbances of skin sensation: Secondary | ICD-10-CM | POA: Diagnosis present

## 2023-05-19 DIAGNOSIS — M25521 Pain in right elbow: Secondary | ICD-10-CM | POA: Insufficient documentation

## 2023-05-19 DIAGNOSIS — M25511 Pain in right shoulder: Secondary | ICD-10-CM | POA: Insufficient documentation

## 2023-05-19 DIAGNOSIS — M7701 Medial epicondylitis, right elbow: Secondary | ICD-10-CM | POA: Diagnosis not present

## 2023-05-19 DIAGNOSIS — M6281 Muscle weakness (generalized): Secondary | ICD-10-CM | POA: Insufficient documentation

## 2023-05-19 NOTE — Therapy (Signed)
OUTPATIENT OCCUPATIONAL THERAPY ORTHO EVALUATION  Patient Name: Natalie Leblanc MRN: [id number] DOB:[birth year], 33 y.o., female Today's Date: 05/19/2023  PCP: Philip Aspen, Limmie Patricia, MD REFERRING PROVIDER: Richardean Sale, DO  END OF SESSION:  OT End of Session - 05/19/23 1609     Visit Number 1    Number of Visits 11    Date for OT Re-Evaluation 07/03/23    Authorization Type [employer] 2025    OT Start Time 1316    OT Stop Time 1400    OT Time Calculation (min) 44 min             Past Medical History:  Diagnosis Date   COVID    Depression    PTSD (post-traumatic stress disorder)    Past Surgical History:  Procedure Laterality Date   BREAST LUMPECTOMY Left    benign per patient   Patient Active Problem List   Diagnosis Date Noted   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 12/28/2019   Vitamin D deficiency 12/23/2019   Depression    PTSD (post-traumatic stress disorder)     ONSET DATE: referral date 04/27/23 (initial injury   REFERRING DIAG: M25.521 (ICD-10-CM) - Right elbow pain M77.01 (ICD-10-CM) - Medial epicondylitis of right elbow  THERAPY DIAG:  Pain in right elbow  Muscle weakness  Other disturbances of skin sensation  Right shoulder pain, unspecified chronicity  Rationale for Evaluation and Treatment: Rehabilitation  SUBJECTIVE:   SUBJECTIVE STATEMENT: Pt reports trying to get "work restrictions" from Dr. Jean Rosenthal as job is requiring her to push items up to 80#. Pt reports that her R arm has been hurting, especially when raising arm or pushing items.  Pt was taking a new pain reliever as prescribed by MD, but reports that has not been working and was having heart flutters Pt accompanied by: self  PERTINENT HISTORY: S/P Right Ganglion Cyst removal on 10/16/2022, PTSD   PRECAUTIONS: Other: previously with 20 pound lifting restriction per Dr Edison Pace note - however this has most likely lapsed  RED FLAGS: None   WEIGHT  BEARING RESTRICTIONS: No  PAIN:  Are you having pain? Yes: NPRS scale: 3/10 Pain location: R shoulder Pain description: aching, "don't do that" Aggravating factors: reaching up overhead Relieving factors: keeping it down, massage  FALLS: Has patient fallen in last 6 months? 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  HAND DOMINANCE: Right  ADLs: Overall ADLs: Increased pain and effort sometimes when bathing and donning shirt, some pain and limitations when doffing jacket  FUNCTIONAL OUTCOME MEASURES:     UPPER EXTREMITY ROM:     Active ROM Right eval Left eval  Shoulder flexion 140   Shoulder abduction 175   Shoulder adduction    Shoulder extension    Shoulder internal rotation 95%, reports increased pain with this movement   Shoulder external rotation 100%, mild pain at end range  Elbow flexion    Elbow extension    Wrist flexion 65   Wrist extension 49   Wrist ulnar deviation    Wrist radial deviation    Wrist pronation    Wrist supination    (Blank rows = not tested)   UPPER EXTREMITY MMT:    Right shoulder and elbow strength of 4/5 Left shoulder/UE strength is 4+ to 5/5 grossly throughout  HAND FUNCTION: Grip strength: Right: 6 lbs; Left: 15 lbs in stress position. Left: 30 lbs in traditional position.  COORDINATION: 9 Hole Peg test:  Right: 35.93 (dropped one and reports "awkward feeling" sec; Left: 29.44 sec  SENSATION: Pt reports that she has nerve pain "like when you hit your funny bone" at some points  EDEMA: no swelling  COGNITION: Overall cognitive status: Within functional limits for tasks assessed  OBSERVATIONS:  Pt grimacing in pain with elbow flexion/extension and supination/pronation and rubbing forearm and up to elbow after attempts at grip strengthening.  Pt reporting limited strength and endurance with overhead reach with RUE.   TREATMENT DATE:  05/19/23 Educated on POC, with recommendation to continue wear of counter force brace during daytime, particularly at work and with repetitive movements. OT encouraging pt to locate wrist splint and bring to future session to assess proper fit.  Also reviewed rest as possible with decreasing repetitive movements, educating on re-initiating massage with pt providing demonstration and handout as well as use of heat for pain management. Educated on simple shoulder shrugs and circles for gentle ROM at this time.                                             Modalities: TBD     PATIENT EDUCATION: Education details: Educated on role and purpose of OT as well as potential interventions and goals for therapy based on initial evaluation findings. Person educated: Patient Education method: Explanation, Verbal cues, and Handouts Education comprehension: verbalized understanding and needs further education  HOME EXERCISE PROGRAM: Access Code: [email or web address] URL: [email or web address] Date: 05/20/2023 Prepared by: [facility] - Outpatient  Rehab - [facility]  Exercises - Seated Shoulder Shrugs  - 2-3 x daily - 10 reps - Seated Shoulder Shrug Circles AROM Forward  - 2-3 x daily - 10 reps  GOALS: Goals reviewed with patient? Yes  SHORT TERM GOALS: Target date: 06/12/23  Pt will be independent with initial HEP. Baseline: return to OP Goal status:  INITIAL  2.  Pt will report understanding of use of modalities and splinting for decreased pain. Baseline: pain 3/10 at rest, but up to 8/10 with work tasks Goal status: INITIAL  3.  Pt will report understanding of joint protection strategies to aid in decreased pain with ADLs, work, and home management tasks. Baseline: chronic pain with return to work Goal status: INITIAL   LONG TERM GOALS: Target date: 07/03/23  Pt will be independent with advanced HEP.  Baseline: return to OP Goal status: INITIAL  2.  Pt will increase right grip strength to at least 15 pounds in stress positioning to allow her to perform job related tasks. Baseline: 6# on Right, 15# on Left Goal status: INITIAL  3.  Patient will report no increase in pain with playing video games and with writing/drawing. Baseline: pain up to 8/10 with repetitive tasks Goal status: INITIAL  4.  Pt will demonstrate increased R shoulder flexion  to 155* to allow for increased ease with placing items into cabinet or high shelf. Baseline: 140* Goal status: INITIAL  5.  Pt will increase right UE strength to at least 4+/5 to allow her to perform job and home management related tasks (dumping litter box) with increased ease. Baseline: grossly 4/5 Goal status: INITIAL  6. Pt will improve functional ability by decreased impairment per Quick DASH assessment from 56% to 40% or better, for better quality of life.  Baseline: Quick dash 56.8/100 Goal status: INITIAL  ASSESSMENT:  CLINICAL IMPRESSION: Patient is a 33 y.o. female who was seen today for occupational therapy evaluation for  R elbow flare up of pain and R shoulder pain. Pt has experienced intermittent periods of improvement in pain from previous physical therapy, however resumes with decrease in therapy and increased challenges at work. 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 PMHx includes increased pain in dry needling, S/P Right Ganglion Cyst removal on 10/16/2022, PTSD.  Pt will benefit from skilled occupational therapy services to address strength and coordination, ROM, pain management, altered sensation, GM/FM control, safety awareness, introduction of compensatory strategies/AE prn, and implementation of an HEP to improve participation and safety during ADLs, IADLs, and work tasks.  Marland Kitchen   PERFORMANCE DEFICITS: in functional skills including ADLs, IADLs, coordination, sensation, edema, ROM, strength, pain, Fine motor control, Gross motor control, body mechanics, endurance, decreased knowledge of precautions, decreased knowledge of use of DME, and UE functional use and psychosocial skills including environmental adaptation and routines and behaviors.   IMPAIRMENTS: are limiting patient from ADLs, IADLs, rest and sleep, work, and leisure.   COMORBIDITIES: may have co-morbidities  that affects occupational performance. Patient will benefit from skilled OT to address above impairments and improve overall function.  MODIFICATION OR ASSISTANCE TO COMPLETE EVALUATION: Min-Moderate modification of tasks or assist with assess necessary to complete an evaluation.  OT OCCUPATIONAL PROFILE AND HISTORY: Detailed assessment: Review of records and additional review of physical, cognitive, psychosocial history related to current functional performance.  CLINICAL DECISION MAKING: Moderate - several treatment options, min-mod task modification necessary  REHAB POTENTIAL: Good  EVALUATION COMPLEXITY: Moderate      PLAN:  OT FREQUENCY: 1-2x/week  OT DURATION: 6 weeks  PLANNED INTERVENTIONS: 97168 OT Re-evaluation, 97535 self care/ADL training, 16109 therapeutic exercise, 97530 therapeutic activity, 97112 neuromuscular re-education, 97140 manual therapy, 97035 ultrasound, 97010 moist heat, 97010 cryotherapy, 97033 iontophoresis, 97760 Orthotics management  and training, 60454 Splinting (initial encounter), [id number] Subsequent splinting/medication, passive range of motion, compression bandaging, psychosocial skills training, energy conservation, coping strategies training, patient/family education, and DME and/or AE instructions  RECOMMENDED OTHER SERVICES: NA  CONSULTED AND AGREED WITH PLAN OF CARE: Patient  PLAN FOR NEXT SESSION: review massage strategies, educate on use of modalities to decrease pain (to include ultrasound).  Initiate shoulder and elbow AROM.   Rosalio Loud, OTR/L 05/19/2023, 4:10 PM  [facility] Rehab at [facility] [address] [place], [address] [place], [place] [address] Phone # [phone] Fax # [phone]

## 2023-05-20 ENCOUNTER — Encounter: Payer: Self-pay | Admitting: Sports Medicine

## 2023-05-20 ENCOUNTER — Encounter: Payer: Self-pay | Admitting: Internal Medicine

## 2023-05-21 ENCOUNTER — Encounter: Payer: Self-pay | Admitting: Physical Medicine & Rehabilitation

## 2023-05-26 ENCOUNTER — Ambulatory Visit: Payer: 59 | Admitting: Occupational Therapy

## 2023-05-26 DIAGNOSIS — M25521 Pain in right elbow: Secondary | ICD-10-CM

## 2023-05-26 DIAGNOSIS — R208 Other disturbances of skin sensation: Secondary | ICD-10-CM

## 2023-05-26 DIAGNOSIS — M6281 Muscle weakness (generalized): Secondary | ICD-10-CM

## 2023-05-26 DIAGNOSIS — M25511 Pain in right shoulder: Secondary | ICD-10-CM

## 2023-05-26 NOTE — Therapy (Signed)
 OUTPATIENT OCCUPATIONAL THERAPY ORTHO  Treatment Note  Patient Name: Natalie Leblanc MRN: [id number] DOB:1990/04/19, 33 y.o., female Today's Date: 05/26/2023  PCP: Philip Aspen, Limmie Patricia, MD REFERRING PROVIDER: Richardean Sale, DO  END OF SESSION:  OT End of Session - 05/26/23 1023     Visit Number 2    Number of Visits 11    Date for OT Re-Evaluation 07/03/23    Authorization Type UHC 2025    OT Start Time 1023    OT Stop Time 1100    OT Time Calculation (min) 37 min              Past Medical History:  Diagnosis Date   COVID    Depression    PTSD (post-traumatic stress disorder)    Past Surgical History:  Procedure Laterality Date   BREAST LUMPECTOMY Left    benign per patient   Patient Active Problem List   Diagnosis Date Noted   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 12/28/2019   Vitamin D deficiency 12/23/2019   Depression    PTSD (post-traumatic stress disorder)     ONSET DATE: referral date 04/27/23 (initial injury   REFERRING DIAG: M25.521 (ICD-10-CM) - Right elbow pain M77.01 (ICD-10-CM) - Medial epicondylitis of right elbow  THERAPY DIAG:  Pain in right elbow  Muscle weakness  Other disturbances of skin sensation  Right shoulder pain, unspecified chronicity  Rationale for Evaluation and Treatment: Rehabilitation  SUBJECTIVE:   SUBJECTIVE STATEMENT: Pt reports things are "pretty bad".  Pt reports the arm is hurting so bad over the last few days and she cannot find any relief.  Pt reports that she has been attempting to use the massage, however having increase in pain. Pt reports considering cutting herself, to alleviate pain and/or to get seen by a provider.  Pt reports "no intent to hurt myself right now, because I have to go to work and I have my cats."   Pt accompanied by: self  PERTINENT HISTORY: S/P Right Ganglion Cyst removal on 10/16/2022, PTSD   PRECAUTIONS: Other: previously with 20 pound lifting  restriction per Dr Edison Pace note - however this has most likely lapsed  RED FLAGS: None   WEIGHT BEARING RESTRICTIONS: No  PAIN:  Are you having pain? Yes: NPRS scale: 6/10 Pain location: R shoulder Pain description: aching, "don't do that" Aggravating factors: reaching up overhead Relieving factors: keeping it down, massage  FALLS: Has patient fallen in last 6 months? 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  HAND DOMINANCE: Right  ADLs: Overall ADLs: Increased pain and effort sometimes when bathing and donning shirt, some pain and limitations when doffing jacket  FUNCTIONAL OUTCOME MEASURES:     UPPER EXTREMITY ROM:     Active ROM Right eval Left eval  Shoulder flexion 140   Shoulder abduction 175   Shoulder adduction    Shoulder extension  Shoulder internal rotation 95%, reports increased pain with this movement   Shoulder external rotation 100%, mild pain at end range   Elbow flexion    Elbow extension    Wrist flexion 65   Wrist extension 49   Wrist ulnar deviation    Wrist radial deviation    Wrist pronation    Wrist supination    (Blank rows = not tested)   UPPER EXTREMITY MMT:    Right shoulder and elbow strength of 4/5 Left shoulder/UE strength is 4+ to 5/5 grossly throughout  HAND FUNCTION: Grip strength: Right: 6 lbs;  Left: 15 lbs in stress position. Left: 30 lbs in traditional position.  COORDINATION: 9 Hole Peg test: Right: 35.93 (dropped one and reports "awkward feeling" sec; Left: 29.44 sec  SENSATION: Pt reports that she has nerve pain "like when you hit your funny bone" at some points  EDEMA: no swelling  COGNITION: Overall cognitive status: Within functional limits for tasks assessed  OBSERVATIONS:  Pt grimacing in pain with elbow flexion/extension and supination/pronation and rubbing forearm and up to elbow after attempts at grip strengthening.  Pt reporting limited strength and endurance with overhead reach with RUE.   TREATMENT DATE:  05/26/23 Due to pt voicing desire to cut her arm, therapist conducted suicide screen (see below) and provided resources for [facility] and counseling services as pt dealing with h/o depression, chronic and exacerbated pain, and financial issues due to missed work s/p arm injury.  Pt voicing hopelessness and frustration with current status and feeling that she is not being heard by medical professionals and still with minimal insight to source of her pain and best course of treatment.   Pain management: OT reiterating use of heat at rest at this point due to flare up of pain with increase focus on massage.  OT encouraging pt to terminate massage and focus on heat and gentle AROM as tolerated.  Screening for Suicide  Answer the following questions with Yes or No and place an "x" beside the action taken.  1. Over the past two weeks, have you felt down, depressed, or hopeless?   Yes  2. Within the past two weeks, have you felt little interest or pleasure in life?  Yes  If YES to either #1 or #2, then ask #3  3. Have you had thoughts that that life is not worth living or that you might be       better off dead?   No  If answer is NO and suspicion is low, then end   4. Over this past week, have you had any thoughts about hurting or even killing  yourself?   Yes "I thought about cutting my arm".  This is due to chronic ongoing pain and flare up of pain.  If NO, then end. Patient in no immediate danger   5. If so, do you believe that you intend to or will harm yourself?  No     If NO, then end. Patient in no immediate danger  IF YES answers to either #4, #5, #6 or #7, then patient is AT RISK for suicide   Actions Taken  ____  Screening negative; no further action required  ____  Screening positive; no immediate danger and patient already in treatment with a  mental health provider. Advise patient to speak to their mental health provider.  __X__  Screening positive; no immediate danger. Patient advised to contact a mental  health provider for further assessment.  ____  Screening positive; in immediate danger as patient states intention of killing self,  has plan and a sense of imminence. Do not leave alone. Seek permission from  patient to contact a family member to inform them. Direct patient to go to ED.     05/19/23 Educated on POC, with recommendation to continue wear of counter force brace during daytime, particularly at work and with repetitive movements. OT encouraging pt to locate wrist splint and bring to future session to assess proper fit.  Also reviewed rest as possible with decreasing repetitive movements, educating on re-initiating massage with pt providing demonstration and handout as well as use of heat for pain management. Educated on simple shoulder shrugs and circles for gentle ROM at this time.                                                  PATIENT EDUCATION: Education details: pain management, resources for counseling  Person educated: Patient Education method: Explanation, Verbal cues, and Handouts Education comprehension: verbalized understanding and needs further education  HOME EXERCISE PROGRAM: Access Code: [email or web address] URL: [email or web address] Date: 05/20/2023 Prepared by:  [facility] - Outpatient  Rehab - [facility]  Exercises - Seated Shoulder Shrugs  - 2-3 x daily - 10 reps - Seated Shoulder Shrug Circles AROM Forward  - 2-3 x daily - 10 reps  GOALS: Goals reviewed with patient? Yes  SHORT TERM GOALS: Target date: 06/12/23  Pt will be independent with initial HEP. Baseline: return to OP Goal status: IN PROGRESS  2.  Pt will report understanding of use of modalities and splinting for decreased pain. Baseline: pain 3/10 at rest, but up to 8/10 with work tasks Goal status: IN PROGRESS  3.  Pt will report understanding of joint protection strategies to aid in decreased pain with ADLs, work, and home management tasks. Baseline: chronic pain with return to work Goal status: IN PROGRESS   LONG TERM GOALS: Target date: 07/03/23  Pt will be independent with advanced HEP.  Baseline: return to OP Goal status: IN PROGRESS  2.  Pt will increase right grip strength to at least 15 pounds in stress positioning to allow her to perform job related tasks. Baseline: 6# on Right, 15# on Left Goal status: IN PROGRESS  3.  Patient will report no increase in pain with playing video games and with writing/drawing. Baseline: pain up to 8/10 with repetitive tasks Goal status: IN PROGRESS  4.  Pt will demonstrate increased R shoulder flexion to 155* to allow for increased ease with placing items into cabinet or high shelf. Baseline: 140* Goal status: IN PROGRESS  5.  Pt will increase right UE strength to at least 4+/5 to allow her to perform job and home management related tasks (dumping litter box) with increased ease. Baseline: grossly 4/5 Goal status: IN PROGRESS  6. Pt will improve functional ability by decreased impairment per Quick DASH assessment from 56% to 40% or better, for better quality of life.  Baseline: Quick dash 56.8/100 Goal status: IN PROGRESS  ASSESSMENT:  CLINICAL IMPRESSION: Pt limited in therapeutic activity this session due elevated  pain and reports of extreme pain over the last few days, leading her to the desire to injure herself "I have thought about cutting my arm".  Pt receptive to education on counseling resources and agreeable to calling today.  Pt will continue to benefit from skilled occupational therapy to address pain, ROM, and strength as needed for ADLs, IADLs, and leisure pursuits.    PERFORMANCE DEFICITS: in functional skills including ADLs, IADLs, coordination, sensation, edema, ROM, strength, pain, Fine motor control, Gross motor control, body mechanics, endurance, decreased knowledge of precautions, decreased knowledge of use of DME, and UE functional use and psychosocial skills including environmental adaptation and routines and behaviors.      PLAN:  OT FREQUENCY: 1-2x/week  OT DURATION: 6 weeks  PLANNED INTERVENTIONS: 97168 OT Re-evaluation, 97535 self care/ADL training, 78295 therapeutic exercise, 97530 therapeutic activity, 97112 neuromuscular re-education, 97140 manual therapy, 97035 ultrasound, 97010 moist heat, 97010 cryotherapy, 97033 iontophoresis, 97760 Orthotics management and training, 62130 Splinting (initial encounter), [id number] Subsequent splinting/medication, passive range of motion, compression bandaging, psychosocial skills training, energy conservation, coping strategies training, patient/family education, and DME and/or AE instructions  RECOMMENDED OTHER SERVICES: NA  CONSULTED AND AGREED WITH PLAN OF CARE: Patient  PLAN FOR NEXT SESSION: review massage strategies - PRN, educate on use of modalities to decrease pain - may benefit from kinesiotaping for pain relief.   Initiate shoulder and elbow AROM.   Rosalio Loud, OTR/L 05/26/2023, 1:57 PM  [facility] Outpatient Rehab at [facility] [address] [place], [address] [place], [place] [address] Phone # [phone] Fax # [phone]

## 2023-05-26 NOTE — Patient Instructions (Signed)
 [email or web address]  [email or web address]

## 2023-05-29 ENCOUNTER — Ambulatory Visit (HOSPITAL_BASED_OUTPATIENT_CLINIC_OR_DEPARTMENT_OTHER): Payer: 59 | Admitting: Orthopaedic Surgery

## 2023-05-29 ENCOUNTER — Ambulatory Visit (HOSPITAL_BASED_OUTPATIENT_CLINIC_OR_DEPARTMENT_OTHER): Payer: 59

## 2023-05-29 DIAGNOSIS — G8929 Other chronic pain: Secondary | ICD-10-CM

## 2023-05-29 DIAGNOSIS — M25561 Pain in right knee: Secondary | ICD-10-CM | POA: Diagnosis not present

## 2023-05-29 NOTE — Progress Notes (Signed)
 Chief Complaint: Right knee pain     History of Present Illness:    Natalie Leblanc is a 33 y.o. female presents today with ongoing persistent right knee pain for the last several months.  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  She has been experiencing medial based knee pain and making it very difficult to get through an entire day.  There is popping and clicking.  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    PMH/PSH/Family History/Social History/Meds/Allergies:    Past Medical History:  Diagnosis Date   COVID    Depression    PTSD (post-traumatic stress disorder)    Past Surgical History:  Procedure Laterality Date   BREAST LUMPECTOMY Left    benign per patient   Social History   Socioeconomic History   Marital status: Single    Spouse name: Not on file   Number of children: Not on file   Years of education: Not on file   Highest education level: 12th grade  Occupational History   Not on file  Tobacco Use   Smoking status: Never   Smokeless tobacco: Never  Vaping Use   Vaping status: Never Used  Substance and Sexual Activity   Alcohol use: Yes    Comment: occasional   Drug use: No   Sexual activity: Yes  Other Topics Concern   Not on file  Social History Narrative   Not on file   Social Drivers of Health   Financial Resource Strain: Low Risk  (07/30/2021)   Overall Financial Resource Strain (CARDIA)    Difficulty of Paying Living Expenses: Not very hard  Food Insecurity: No Food Insecurity (07/30/2021)   Hunger Vital Sign    Worried About Running Out of Food in the Last Year: Never true    Ran Out of Food in the Last Year: Never true  Transportation Needs: No Transportation Needs (07/30/2021)   PRAPARE - Administrator, Civil Service (Medical): No    Lack of Transportation (Non-Medical):  No  Physical Activity: Unknown (07/30/2021)   Exercise Vital Sign    Days of Exercise per Week: Patient declined    Minutes of Exercise per Session: Not on file  Stress: Stress Concern Present (07/30/2021)   [employer] of Occupational Health - Occupational Stress Questionnaire    Feeling of Stress : Very much  Social Connections: Unknown (08/05/2021)   Received from [facility]   Social Network    Social Network: Not on file  Recent Concern: Social Connections - Socially Isolated (07/30/2021)   Social Connection and Isolation Panel [NHANES]    Frequency of Communication with Friends and Family: Twice a week    Frequency of Social Gatherings with Friends and Family: Once a week    Attends Religious Services: Never    Database administrator or Organizations: No    Attends Engineer, structural: Not on file    Marital Status: Never married   Family History  Problem Relation Age of Onset   Gestational diabetes Mother    Alcohol abuse Mother    Mental illness Mother    Eczema Sister    Depression Sister    Psychosis Maternal Aunt    Thyroid disease Sister    Depression Sister    Heart murmur Sister  Allergies  Allergen Reactions   Fish Allergy     GI upset   Mobic [Meloxicam]    Other Itching    Animal Dander   Current Outpatient Medications  Medication Sig Dispense Refill   celecoxib (CELEBREX) 200 MG capsule Take 1 capsule (200 mg total) by mouth 2 (two) times daily. 60 capsule 0   hydrOXYzine (ATARAX) 25 MG tablet Take 25 mg by mouth at bedtime as needed.     Multiple Vitamin (MULTIVITAMIN) capsule Take 1 capsule by mouth daily.     rizatriptan (MAXALT) 10 MG tablet Take 1 tablet (10 mg total) by mouth as needed for migraine. May repeat in 2 hours if needed 10 tablet 6   sertraline (ZOLOFT) 100 MG tablet Take 100 mg by mouth daily.     No current facility-administered medications for this visit.   No results found.  Review of Systems:    A ROS was performed including pertinent positives and negatives as documented in the HPI.  Physical Exam :   Constitutional: NAD and appears stated age Neurological: Alert and oriented Psych: Appropriate affect and cooperative There were no vitals taken for this visit.   Comprehensive Musculoskeletal Exam:    Right knee with tenderness about the medial joint line, negative Lachman, positive McMurray.  Mild effusion.  Range of motion is -3 to 235 degrees.  Distal neurosensory exam is intact   Imaging:   Xray (4 views right knee): Normal    I personally reviewed and interpreted the radiographs.   Assessment and Plan:   33 y.o. female with evidence of possible right knee medial meniscal tear.  At this time she has having a very difficult time putting weight at the end of a long day at her job in a [employer].  Given this I would like to obtain an MRI to rule out any type of meniscal root injury.  Plan to proceed with this and I will see her back following -Plan for MRI right knee and follow-up discuss results   I personally saw and evaluated the patient, and participated in the management and treatment plan.  Huel Cote, MD Attending Physician, Orthopedic Surgery  This document was dictated using Dragon voice recognition software. A reasonable attempt at proof reading has been made to minimize errors.

## 2023-06-02 ENCOUNTER — Ambulatory Visit: Payer: 59 | Admitting: Occupational Therapy

## 2023-06-02 DIAGNOSIS — R208 Other disturbances of skin sensation: Secondary | ICD-10-CM

## 2023-06-02 DIAGNOSIS — M6281 Muscle weakness (generalized): Secondary | ICD-10-CM

## 2023-06-02 DIAGNOSIS — M25521 Pain in right elbow: Secondary | ICD-10-CM | POA: Diagnosis not present

## 2023-06-02 DIAGNOSIS — M25511 Pain in right shoulder: Secondary | ICD-10-CM

## 2023-06-02 NOTE — Therapy (Signed)
 OUTPATIENT OCCUPATIONAL THERAPY ORTHO  Treatment Note  Patient Name: Natalie Leblanc MRN: [id number] DOB:[name] 06, 1990, 33 y.o., female Today's Date: 06/03/2023  PCP: Philip Aspen, Limmie Patricia, MD REFERRING PROVIDER: Richardean Sale, DO  END OF SESSION:  OT End of Session - 06/03/23 0757     Visit Number 3    Number of Visits 11    Date for OT Re-Evaluation 07/03/23    Authorization Type UHC 2025    OT Start Time 1318    OT Stop Time 1400    OT Time Calculation (min) 42 min               Past Medical History:  Diagnosis Date   COVID    Depression    PTSD (post-traumatic stress disorder)    Past Surgical History:  Procedure Laterality Date   BREAST LUMPECTOMY Left    benign per patient   Patient Active Problem List   Diagnosis Date Noted   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 12/28/2019   Vitamin D deficiency 12/23/2019   Depression    PTSD (post-traumatic stress disorder)     ONSET DATE: referral date 04/27/23 (initial injury   REFERRING DIAG: M25.521 (ICD-10-CM) - Right elbow pain M77.01 (ICD-10-CM) - Medial epicondylitis of right elbow  THERAPY DIAG:  Pain in right elbow  Muscle weakness  Other disturbances of skin sensation  Right shoulder pain, unspecified chronicity  Rationale for Evaluation and Treatment: Rehabilitation  SUBJECTIVE:   SUBJECTIVE STATEMENT: Pt reports "I try do stuff when I can" but reports that she rests when she needs to.  Pt did report wearing her elbow brace yesterday at work due to increased pain.     Pt accompanied by: self  PERTINENT HISTORY: S/P Right Ganglion Cyst removal on 10/16/2022, PTSD   PRECAUTIONS: Other: previously with 20 pound lifting restriction per Dr Edison Pace note - however this has most likely lapsed  RED FLAGS: None   WEIGHT BEARING RESTRICTIONS: No  PAIN:  Are you having pain? Yes: NPRS scale: 2-3/10 Pain location: R shoulder and elbow Pain description: sharp,  aching Aggravating factors: reaching up overhead, lifting boxes Relieving factors: keeping it down, massage  FALLS: Has patient fallen in last 6 months? 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  HAND DOMINANCE: Right  ADLs: Overall ADLs: Increased pain and effort sometimes when bathing and donning shirt, some pain and limitations when doffing jacket  FUNCTIONAL OUTCOME MEASURES:     UPPER EXTREMITY ROM:     Active ROM Right eval Left eval  Shoulder flexion 140   Shoulder abduction 175   Shoulder adduction    Shoulder extension    Shoulder internal rotation 95%, reports increased pain with this movement   Shoulder external rotation 100%, mild pain at end range   Elbow flexion    Elbow extension    Wrist flexion 65   Wrist extension 49  Wrist ulnar deviation    Wrist radial deviation    Wrist pronation    Wrist supination    (Blank rows = not tested)   UPPER EXTREMITY MMT:    Right shoulder and elbow strength of 4/5 Left shoulder/UE strength is 4+ to 5/5 grossly throughout  HAND FUNCTION: Grip strength: Right: 6 lbs; Left: 15 lbs in stress position. Left: 30 lbs in traditional position.  COORDINATION: 9 Hole Peg test: Right: 35.93 (dropped one and reports "awkward feeling" sec; Left: 29.44 sec  SENSATION: Pt reports that she has nerve pain  "like when you hit your funny bone" at some points  EDEMA: no swelling  COGNITION: Overall cognitive status: Within functional limits for tasks assessed  OBSERVATIONS:  Pt grimacing in pain with elbow flexion/extension and supination/pronation and rubbing forearm and up to elbow after attempts at grip strengthening.  Pt reporting limited strength and endurance with overhead reach with RUE.   TREATMENT DATE:  06/02/23 RUE AROM/stretch: OT providing demonstration and education on rationale of ROM and increased activity tolerance. ulnar nerve glide x5 Wrist prayer stretch x30" Rt wrist ext stretch x30" Scapular retraction x5 Kinesiotape: OT applied kinesiotape to R medial aspect of elbow above olecranon process at area of pain/tenderness.  OT applied "X" pattern over area of pain with 80% stretch and then applied longer tape from medial aspect of upper arm, over medial aspect of elbow and anchored at mid forearm with 50% stretch to provide support and decrease pain.  Pt also asking for tape at R shoulder.  Pt pointing to area of pain in trapezius near crook of neck, therefore OT applied "X" pattern over area of pain with 50-75% stretch for pain relief.   Self-care: OT educated on rationale of kinesiotape and typical wear time and how to remove at end of cycle and/or if irritation occurs prior to 4-5 days time.  Pt provided with handout from [employer], see below education.   05/26/23 Due to pt voicing desire to cut her arm, therapist conducted suicide screen (see below) and provided resources for [facility] and counseling services as pt dealing with h/o depression, chronic and exacerbated pain, and financial issues due to missed work s/p arm injury.  Pt voicing hopelessness and frustration with current status and feeling that she is not being heard by medical professionals and still with minimal insight to source of her pain and best course of treatment.   Pain management: OT reiterating  use of heat at rest at this point due to flare up of pain with increase focus on massage.  OT encouraging pt to terminate massage and focus on heat and gentle AROM as tolerated.  Screening for Suicide  Answer the following questions with Yes or No and place an "x" beside the action taken.  1. Over the past two weeks, have you felt down, depressed, or hopeless?   Yes  2. Within the past two weeks, have you felt little interest or pleasure in life?  Yes  If YES to either #1 or #2, then ask #3  3. Have you had thoughts that that life is not worth living or that you might be       better off dead?   No  If answer is NO and suspicion is low, then end   4. Over this past week, have you had any thoughts about hurting or even killing yourself?   Yes "I thought about cutting my arm".  This is due to chronic ongoing  pain and flare up of pain.  If NO, then end. Patient in no immediate danger   5. If so, do you believe that you intend to or will harm yourself?  No     If NO, then end. Patient in no immediate danger  IF YES answers to either #4, #5, #6 or #7, then patient is AT RISK for suicide   Actions Taken  ____  Screening negative; no further action required  ____  Screening positive; no immediate danger and patient already in treatment with a  mental health provider. Advise patient to speak to their mental health provider.  __X__  Screening positive; no immediate danger. Patient advised to contact a mental  health provider for further assessment.   ____  Screening positive; in immediate danger as patient states intention of killing self,  has plan and a sense of imminence. Do not leave alone. Seek permission from  patient to contact a family member to inform them. Direct patient to go to ED.     05/19/23 Educated on POC, with recommendation to continue wear of counter force brace during daytime, particularly at work and with repetitive movements. OT encouraging pt to locate  wrist splint and bring to future session to assess proper fit.  Also reviewed rest as possible with decreasing repetitive movements, educating on re-initiating massage with pt providing demonstration and handout as well as use of heat for pain management. Educated on simple shoulder shrugs and circles for gentle ROM at this time.                                                  PATIENT EDUCATION: Education details: pain management, resources for counseling  Person educated: Patient Education method: Explanation, Verbal cues, and Handouts Education comprehension: verbalized understanding and needs further education  HOME EXERCISE PROGRAM: Access Code: [email or web address] URL: [email or web address] Date: 05/20/2023 Prepared by: [facility] - Outpatient  Rehab - [facility]  Exercises - Seated Shoulder Shrugs  - 2-3 x daily - 10 reps - Seated Shoulder Shrug Circles AROM Forward  - 2-3 x daily - 10 reps  Access Code: [email or web address] URL: [email or web address] Date: 06/03/2023 Prepared by: [facility] - Outpatient  Rehab - [facility]  Exercises - Standing Ulnar Nerve Glide  - 1 x daily - 7 x weekly - 2 sets - 10 reps - Seated Wrist Extension Stretch  - 1 x daily - 7 x weekly - 2 reps - 20 sec hold - Wrist Prayer Stretch  - 1 x daily - 7 x weekly - 2 reps - 20 sec hold - Seated Scapular Retraction  - 1 x daily - 7 x weekly - 2 sets - 10 reps  Patient Education - Kinesiology tape  GOALS: Goals reviewed with patient? Yes  SHORT TERM GOALS: Target date: 06/12/23  Pt will be independent with initial HEP. Baseline: return to OP Goal status: IN PROGRESS  2.  Pt will report understanding of use of modalities and splinting for decreased pain. Baseline: pain 3/10 at rest, but up to 8/10 with work tasks Goal status: IN PROGRESS  3.  Pt will report understanding of joint protection strategies to aid in decreased pain with ADLs, work, and home management  tasks. Baseline: chronic pain with return to work Goal status: IN PROGRESS   LONG TERM GOALS: Target date: 07/03/23  Pt  will be independent with advanced HEP.  Baseline: return to OP Goal status: IN PROGRESS  2.  Pt will increase right grip strength to at least 15 pounds in stress positioning to allow her to perform job related tasks. Baseline: 6# on Right, 15# on Left Goal status: IN PROGRESS  3.  Patient will report no increase in pain with playing video games and with writing/drawing. Baseline: pain up to 8/10 with repetitive tasks Goal status: IN PROGRESS  4.  Pt will demonstrate increased R shoulder flexion to 155* to allow for increased ease with placing items into cabinet or high shelf. Baseline: 140* Goal status: IN PROGRESS  5.  Pt will increase right UE strength to at least 4+/5 to allow her to perform job and home management related tasks (dumping litter box) with increased ease. Baseline: grossly 4/5 Goal status: IN PROGRESS  6. Pt will improve functional ability by decreased impairment per Quick DASH assessment from 56% to 40% or better, for better quality of life.  Baseline: Quick dash 56.8/100 Goal status: IN PROGRESS  ASSESSMENT:  CLINICAL IMPRESSION: Pt tolerating gentle ROM and stretches this session and appreciative of attempts of kinesiotape for pain relief and support at area of pain and tenderness in elbow.  Pt will continue to benefit from skilled occupational therapy to address pain, ROM, and strength as needed for ADLs, IADLs, and leisure pursuits.    PERFORMANCE DEFICITS: in functional skills including ADLs, IADLs, coordination, sensation, edema, ROM, strength, pain, Fine motor control, Gross motor control, body mechanics, endurance, decreased knowledge of precautions, decreased knowledge of use of DME, and UE functional use and psychosocial skills including environmental adaptation and routines and behaviors.      PLAN:  OT FREQUENCY:  1-2x/week  OT DURATION: 6 weeks  PLANNED INTERVENTIONS: 97168 OT Re-evaluation, 97535 self care/ADL training, 81191 therapeutic exercise, 97530 therapeutic activity, 97112 neuromuscular re-education, 97140 manual therapy, 97035 ultrasound, 97010 moist heat, 97010 cryotherapy, 97033 iontophoresis, 97760 Orthotics management and training, 47829 Splinting (initial encounter), [id number] Subsequent splinting/medication, passive range of motion, compression bandaging, psychosocial skills training, energy conservation, coping strategies training, patient/family education, and DME and/or AE instructions  RECOMMENDED OTHER SERVICES: NA  CONSULTED AND AGREED WITH PLAN OF CARE: Patient  PLAN FOR NEXT SESSION: review massage strategies - PRN, educate on use of modalities to decrease pain -  How did Kinesiotape go? Initiate shoulder and elbow AROM. - incorporate WB and use of yellow or red theraband for shoulder and elbow strength   Bradey Luzier, OTR/L 06/03/2023, 7:59 AM  [facility] at [facility] [address] [place], [address] [place], [place] [address] Phone # [phone] Fax # [phone]

## 2023-06-05 ENCOUNTER — Ambulatory Visit: Payer: 59 | Admitting: Physical Medicine & Rehabilitation

## 2023-06-05 ENCOUNTER — Encounter (HOSPITAL_BASED_OUTPATIENT_CLINIC_OR_DEPARTMENT_OTHER): Payer: Self-pay | Admitting: Orthopaedic Surgery

## 2023-06-09 ENCOUNTER — Ambulatory Visit: Payer: 59 | Attending: Sports Medicine | Admitting: Occupational Therapy

## 2023-06-09 DIAGNOSIS — M25521 Pain in right elbow: Secondary | ICD-10-CM | POA: Insufficient documentation

## 2023-06-09 DIAGNOSIS — M6281 Muscle weakness (generalized): Secondary | ICD-10-CM | POA: Diagnosis present

## 2023-06-09 DIAGNOSIS — M25511 Pain in right shoulder: Secondary | ICD-10-CM | POA: Diagnosis present

## 2023-06-09 DIAGNOSIS — R208 Other disturbances of skin sensation: Secondary | ICD-10-CM | POA: Insufficient documentation

## 2023-06-09 NOTE — Therapy (Signed)
 OUTPATIENT OCCUPATIONAL THERAPY ORTHO  Treatment Note  Patient Name: Natalie Leblanc MRN: [id number] DOB:1991-01-17, 33 y.o., female Today's Date: 06/09/2023  PCP: Philip Aspen, Limmie Patricia, MD REFERRING PROVIDER: Richardean Sale, DO  END OF SESSION:  OT End of Session - 06/09/23 1509     Visit Number 4    Number of Visits 11    Date for OT Re-Evaluation 07/03/23    Authorization Type [employer] [id number]    OT Start Time 1320    OT Stop Time 1400    OT Time Calculation (min) 40 min                Past Medical History:  Diagnosis Date   COVID    Depression    PTSD (post-traumatic stress disorder)    Past Surgical History:  Procedure Laterality Date   BREAST LUMPECTOMY Left    benign per patient   Patient Active Problem List   Diagnosis Date Noted   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 12/28/2019   Vitamin D deficiency 12/23/2019   Depression    PTSD (post-traumatic stress disorder)     ONSET DATE: referral date 04/27/23 (initial injury   REFERRING DIAG: M25.521 (ICD-10-CM) - Right elbow pain M77.01 (ICD-10-CM) - Medial epicondylitis of right elbow  THERAPY DIAG:  Pain in right elbow  Muscle weakness  Other disturbances of skin sensation  Right shoulder pain, unspecified chronicity  Rationale for Evaluation and Treatment: Rehabilitation  SUBJECTIVE:   SUBJECTIVE STATEMENT: Pt reports having increased pain last night after having to cut some things down and pushing items at work.     Pt accompanied by: self  PERTINENT HISTORY: S/P Right Ganglion Cyst removal on 10/16/2022, PTSD   PRECAUTIONS: Other: previously with 20 pound lifting restriction per Dr Edison Pace note - however this has most likely lapsed  RED FLAGS: None   WEIGHT BEARING RESTRICTIONS: No  PAIN:  Are you having pain? Yes: NPRS scale: 4-5/10 Pain location: R shoulder and elbow Pain description: sharp, aching Aggravating factors: reaching up overhead, lifting  boxes Relieving factors: keeping it down, massage  FALLS: Has patient fallen in last 6 months? 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  HAND DOMINANCE: Right  ADLs: Overall ADLs: Increased pain and effort sometimes when bathing and donning shirt, some pain and limitations when doffing jacket  FUNCTIONAL OUTCOME MEASURES:     UPPER EXTREMITY ROM:     Active ROM Right eval Left eval  Shoulder flexion 140   Shoulder abduction 175   Shoulder adduction    Shoulder extension    Shoulder internal rotation 95%, reports increased pain with this movement   Shoulder external rotation 100%, mild pain at end range   Elbow flexion    Elbow extension    Wrist flexion 65   Wrist extension 49   Wrist ulnar deviation    Wrist radial deviation  Wrist pronation    Wrist supination    (Blank rows = not tested)   UPPER EXTREMITY MMT:    Right shoulder and elbow strength of 4/5 Left shoulder/UE strength is 4+ to 5/5 grossly throughout  HAND FUNCTION: Grip strength: Right: 6 lbs; Left: 15 lbs in stress position. Left: 30 lbs in traditional position.  COORDINATION: 9 Hole Peg test: Right: 35.93 (dropped one and reports "awkward feeling" sec; Left: 29.44 sec  SENSATION: Pt reports that she has nerve pain "like when you hit your funny bone" at some  points  EDEMA: no swelling  COGNITION: Overall cognitive status: Within functional limits for tasks assessed  OBSERVATIONS:  Pt grimacing in pain with elbow flexion/extension and supination/pronation and rubbing forearm and up to elbow after attempts at grip strengthening.  Pt reporting limited strength and endurance with overhead reach with RUE.   TREATMENT DATE:  06/09/23 Sleeping: OT educating pt in sleeping on side to allow for improved positioning of RUE as pt reporting preferring to sleep on side or stomach.  Encouraged support under R arm to allow for improved positioning. Kinesiotape: pt tolerated kinesiotape until Friday morning, reporting that it did provide additional support and gave arm "room to breathe" to allow pt to participate in work tasks.  Pt agreeable to attempting again. OT applied kinesiotape to R medial aspect of elbow above olecranon process at area of pain/tenderness.  OT applied "X" pattern over area of pain with 80% stretch and then applied longer tape from medial aspect of upper arm, over medial aspect of elbow and anchored at mid forearm with 50% stretch to provide support and decrease pain.  Therex:  WB through BUE on elevated table top.  Engaged in alternating reaching to targets 2 sets of 5, followed by shoulder taps x10 to facilitate increased BUE strengthening as needed for endurance and overhead reaching with work tasks.   Wall slides with focus on shoulder flexion.  Completing x10 with sustained hold for 10 seconds Elbow extension with red theraband, 2 sets of 10 with cues for resistance and technique.      06/02/23 RUE AROM/stretch: OT providing demonstration and education on rationale of ROM and increased activity tolerance. ulnar nerve glide x5 Wrist prayer stretch x30" Rt wrist ext stretch x30" Scapular retraction x5 Kinesiotape: OT applied kinesiotape to R medial aspect of elbow above olecranon process at area of pain/tenderness.  OT applied "X"  pattern over area of pain with 80% stretch and then applied longer tape from medial aspect of upper arm, over medial aspect of elbow and anchored at mid forearm with 50% stretch to provide support and decrease pain.  Pt also asking for tape at R shoulder.  Pt pointing to area of pain in trapezius near crook of neck, therefore OT applied "X" pattern over area of pain with 50-75% stretch for pain relief.   Self-care: OT educated on rationale of kinesiotape and typical wear time and how to remove at end of cycle and/or if irritation occurs prior to 4-5 days time.  Pt provided with handout from [employer], see below education.   05/26/23 Due to pt voicing desire to cut her arm, therapist conducted suicide screen (see below) and provided resources for [facility] and counseling services as pt dealing with h/o depression, chronic and exacerbated pain, and financial issues due to missed work s/p arm injury.  Pt voicing hopelessness and frustration with current status and feeling that she is not being heard by medical professionals and still  with minimal insight to source of her pain and best course of treatment.   Pain management: OT reiterating use of heat at rest at this point due to flare up of pain with increase focus on massage.  OT encouraging pt to terminate massage and focus on heat and gentle AROM as tolerated.  Screening for Suicide  Answer the following questions with Yes or No and place an "x" beside the action taken.  1. Over the past two weeks, have you felt down, depressed, or hopeless?   Yes  2. Within the past two weeks, have you felt little interest or pleasure in life?  Yes  If YES to either #1 or #2, then ask #3  3. Have you had thoughts that that life is not worth living or that you might be       better off dead?   No  If answer is NO and suspicion is low, then end   4. Over this past week, have you had any thoughts about hurting or even killing yourself?   Yes "I  thought about cutting my arm".  This is due to chronic ongoing pain and flare up of pain.  If NO, then end. Patient in no immediate danger   5. If so, do you believe that you intend to or will harm yourself?  No     If NO, then end. Patient in no immediate danger  IF YES answers to either #4, #5, #6 or #7, then patient is AT RISK for suicide   Actions Taken  ____  Screening negative; no further action required  ____  Screening positive; no immediate danger and patient already in treatment with a  mental health provider. Advise patient to speak to their mental health provider.  __X__  Screening positive; no immediate danger. Patient advised to contact a mental  health provider for further assessment.   ____  Screening positive; in immediate danger as patient states intention of killing self,  has plan and a sense of imminence. Do not leave alone. Seek permission from  patient to contact a family member to inform them. Direct patient to go to ED.       PATIENT EDUCATION: Education details: pain management, resources for counseling  Person educated: Patient Education method: Explanation, Verbal cues, and Handouts Education comprehension: verbalized understanding and needs further education  HOME EXERCISE PROGRAM: Access Code: [email or web address] URL: [email or web address] Date: 05/20/2023 Prepared by: [facility] - Outpatient  Rehab - [facility]  Exercises - Seated Shoulder Shrugs  - 2-3 x daily - 10 reps - Seated Shoulder Shrug Circles AROM Forward  - 2-3 x daily - 10 reps  Access Code: [email or web address] URL: [email or web address] Date: 06/09/2023 Prepared by: [facility] - Outpatient  Rehab - [facility]  Exercises - Standing Ulnar Nerve Glide  - 1 x daily - 7 x weekly - 2 sets - 10 reps - Seated Wrist Extension Stretch  - 1 x daily - 7 x weekly - 2 reps - 20 sec hold - Wrist Prayer Stretch  - 1 x daily - 7 x weekly - 2 reps - 20 sec hold - Seated  Scapular Retraction  - 1 x daily - 7 x weekly - 2 sets - 10 reps - Standing shoulder flexion wall slides  - 1 x daily - 7 x weekly - 2 sets - 10 reps - Full Plank on Counter, Opposite Shoulder Taps  - 1 x daily - 7 x weekly - 2 sets - 10  reps - Seated Elbow Extension with Self-Anchored Resistance  - 1 x daily - 7 x weekly - 2 sets - 10 reps  Patient Education - Kinesiology tape  GOALS: Goals reviewed with patient? Yes  SHORT TERM GOALS: Target date: 06/12/23  Pt will be independent with initial HEP. Baseline: return to OP Goal status: IN PROGRESS  2.  Pt will report understanding of use of modalities and splinting for decreased pain. Baseline: pain 3/10 at rest, but up to 8/10 with work tasks Goal status: IN PROGRESS  3.  Pt will report understanding of joint protection strategies to aid in decreased pain with ADLs, work, and home management tasks. Baseline: chronic pain with return to work Goal status: IN PROGRESS   LONG TERM GOALS: Target date: 07/03/23  Pt will be independent with advanced HEP.  Baseline: return to OP Goal status: IN PROGRESS  2.  Pt will increase right grip strength to at least 15 pounds in stress positioning to allow her to perform job related tasks. Baseline: 6# on Right, 15# on Left Goal status: IN PROGRESS  3.  Patient will report no increase in pain with playing video games and with writing/drawing. Baseline: pain up to 8/10 with repetitive tasks Goal status: IN PROGRESS  4.  Pt will demonstrate increased R shoulder flexion to 155* to allow for increased ease with placing items into cabinet or high shelf. Baseline: 140* Goal status: IN PROGRESS  5.  Pt will increase right UE strength to at least 4+/5 to allow her to perform job and home management related tasks (dumping litter box) with increased ease. Baseline: grossly 4/5 Goal status: IN PROGRESS  6. Pt will improve functional ability by decreased impairment per Quick DASH assessment from 56%  to 40% or better, for better quality of life.  Baseline: Quick dash 56.8/100 Goal status: IN PROGRESS  ASSESSMENT:  CLINICAL IMPRESSION: Pt tolerating increased resistance with WB on table top and use of red theraband for elbow extension.  Pt reporting feeling stretch with activities but no increase in pain.  Pt tolerating kinesiotape for 3 days and agreeable to attempting again for continued pain relief and support at area of pain and tenderness in elbow.  Pt will continue to benefit from skilled occupational therapy to address pain, ROM, and strength as needed for ADLs, IADLs, and leisure pursuits.    PERFORMANCE DEFICITS: in functional skills including ADLs, IADLs, coordination, sensation, edema, ROM, strength, pain, Fine motor control, Gross motor control, body mechanics, endurance, decreased knowledge of precautions, decreased knowledge of use of DME, and UE functional use and psychosocial skills including environmental adaptation and routines and behaviors.      PLAN:  OT FREQUENCY: 1-2x/week  OT DURATION: 6 weeks  PLANNED INTERVENTIONS: 97168 OT Re-evaluation, 97535 self care/ADL training, 16109 therapeutic exercise, 97530 therapeutic activity, 97112 neuromuscular re-education, 97140 manual therapy, 97035 ultrasound, 97010 moist heat, 97010 cryotherapy, 97033 iontophoresis, 97760 Orthotics management and training, 60454 Splinting (initial encounter), [id number] Subsequent splinting/medication, passive range of motion, compression bandaging, psychosocial skills training, energy conservation, coping strategies training, patient/family education, and DME and/or AE instructions  RECOMMENDED OTHER SERVICES: NA  CONSULTED AND AGREED WITH PLAN OF CARE: Patient  PLAN FOR NEXT SESSION: review massage strategies - PRN, educate on use of modalities to decrease pain -  How did Kinesiotape go - want to continue with it? Add to shoulder and elbow AROM. - incorporate WB and use of red theraband for  shoulder and elbow strength, may complete with anchored in  door frame   Raywick, Maralyn Sago, OTR/L 06/09/2023, 3:10 PM  [facility] at [facility] [address] [place], [address] [place], [place] [address] Phone # [phone] Fax # [phone]

## 2023-06-16 ENCOUNTER — Ambulatory Visit: Payer: 59 | Admitting: Occupational Therapy

## 2023-06-16 NOTE — Progress Notes (Signed)
 Subjective:    Patient ID: Natalie Leblanc, female    DOB: [birth year], 33 y.o.   MRN: [id number]  HPI  HPI  Natalie Leblanc is a 33 y.o. year old female  who  has a past medical history of COVID, Depression, and PTSD (post-traumatic stress disorder).   They are presenting to PM&R clinic as a new patient for pain management evaluation. They were referred by Dr. Jean Rosenthal for treatment of right elbow medial epicondylittis pain.   Per his last note 1/20: - Mild improvement in chronic elbow pain consistent with primarily medial epicondylitis and less severe lateral epicondylitis.   - Patient states that she felt no improvement with Celebrex, and had heart palpitations, so she discontinued medication after 2 weeks.  Patient has now had no improvement with meloxicam and Celebrex courses, so we will discontinue prescription NSAIDs - Start Tylenol 500 to 1000 mg tablets 2-3 times a day for day-to-day pain relief  - Patient will have intermittent periods of improvement with physical therapy, ECSWT, dry needling, bracing, however she continues to have flares of pain.  I suspect flares of pain are likely related to repetitive use at work.  As long as patient continues a job that requires repetitive motions with right arm, I suspect this condition will continue to flare as it has been present for over 1 year with completely unremarkable right elbow MRI findings on 06/03/2022 - Patient is concerned for alternative explanations for her pain.  She is concerned for myofascial pain, neurologic symptoms, triceps tendinopathy, shoulder pathology.  Patient's pain has never fit a neurologic pattern. Patient's symptoms continue to be consistent with primarily medial epicondylitis and partially lateral epicondylitis based on physical exam.  Patient has established care visit scheduled for February 2025 -Continue HEP, PT, OT.  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 Pain was instantaneous, no swelling/bruising.   MRI right elbow 05/2022 without any significant findings.    Description of pain: "The entire arm" on the right, mostly on the right medial epicondyle and radiating up into her triceps and left shoulder; not down into the wrist at this time. Aching and throbbing pain "stuttery, feels like it's grinding".   Exacerbating factors: Shoulder pain is always there; elbow only when she touches it. Work exacerbates it (wraps pallettes).   Remitting factors: heating pad Red flag symptoms: No red flags for back pain endorsed in Hx or ROS  Medications tried: Topical medications (never tried) : "they tried a patch but it made me sick"; was in OT and "she used magnets with it". Nsaids (unsure of effect) : Mobic gave her heart palpitations Tylenol  (mild effect) : "I try not to take it; I have a higher tolerance to pain medication". Takes once a week or so.  Opiates  (no effect) : Post-op, no effect Gabapentin / Lyrica  (never tried) :  TCAs  (never tried) :  SNRIs  (never tried) :  Other  (never tried) :   Other treatments: PT/OT  (mild effect) : She thinks OT is is helping; she is still in pain but stronger.  Accupuncture/chiropractor/massage  (moderate effect) : Dry needling worked well, but "the  parts that are left are too painful for them to be poking in". Massages help a little.  TENs unit (never tried) :  Injections (never tried) : Never was offered; Ortho sais they did not like doing those injections because they will not help in the long run but did offer, patient declines Surgery (no effect) : Had R wrist cyst removed in July Other  (no effect) : Extracorporeal shockwave helped in the forearm  but not the elbow.  Tried edible THC without benefit, hurt her stomach.   Uses an elbow brace sometimes and taping with OT.  Goals for pain control: "clean my apartment".   She endorses SI 3 week ago, not currently. Goes to [employer]	[place] and goes every 3-4 months; unsure when her next one is. She does not have a therapist, just a doctor for medications. She says the medications work well when she is on them.   Prior UDS results: No results found for: "LABOPIA", "COCAINSCRNUR", "LABBENZ", "AMPHETMU", "THCU", "LABBARB"    Pain Inventory Average Pain 7 Pain Right Now 6 My pain is intermittent, constant, sharp, dull, and aching  In the last 24 hours, has pain interfered with the following? General activity 8 Relation with others 4 Enjoyment of life 8 What TIME of day is your pain at its worst? night and varies Sleep (in general) Fair  Pain is worse with: some activites Pain improves with: rest and heat/ice Relief from Meds: 7  walk without assistance ability to climb steps?  yes do you drive?  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  no  Primary care Natalie Leblanc @ [facility] sertraline    Family History  Problem Relation Age of Onset   Gestational diabetes Mother    Alcohol abuse Mother    Mental illness Mother    Eczema Sister    Depression Sister    Psychosis Maternal Aunt    Thyroid disease Sister    Depression Sister    Heart murmur Sister    Social History   Socioeconomic History   Marital status: Single    Spouse name: Not on file   Number of children: Not on file   Years of education: Not on file   Highest education level: 12th grade  Occupational History   Not on file  Tobacco Use   Smoking status: Never   Smokeless tobacco: Never  Vaping Use   Vaping status: Never Used   Substance and Sexual Activity   Alcohol use: Yes    Comment: occasional   Drug use: No   Sexual activity: Yes  Other Topics Concern   Not on file  Social History Narrative   Not on file   Social Drivers of Health   Financial Resource Strain: Low Risk  (07/30/2021)   Overall Financial Resource Strain (CARDIA)    Difficulty of Paying Living Expenses: Not very hard  Food Insecurity: No Food Insecurity (07/30/2021)   Hunger Vital Sign    Worried About Running Out of Food in the Last Year: Never true    Ran Out of Food in the Last Year: Never true  Transportation Needs: No Transportation Needs (07/30/2021)   PRAPARE - Administrator, Civil Service (Medical): No    Lack of Transportation (Non-Medical): No  Physical Activity: Unknown (07/30/2021)   Exercise Vital Sign  Days of Exercise per Week: Patient declined    Minutes of Exercise per Session: Not on file  Stress: Stress Concern Present (07/30/2021)   [facility]ie Leblanc [employer] - Occupational Stress Questionnaire    Feeling of Stress : Very much  Social Connections: Unknown (08/05/2021)   [facility], [facility]   Social Network    Social Network: Not on file  Recent Concern: Social Connections - Socially Isolated (07/30/2021)   Social Connection and Isolation Panel [NHANES]    Frequency of Communication with Friends and Family: Twice a week    Frequency of Social Gatherings with Friends and Family: Once a week    Attends Religious Services: Never    Database administrator or Organizations: No    Attends Engineer, structural: Not on file    Marital Status: Never married   Past Surgical History:  Procedure Laterality Date   BREAST LUMPECTOMY Left    benign per patient   Past Medical History:  Diagnosis Date   COVID    Depression    PTSD (post-traumatic stress disorder)    There were no vitals taken for this visit.  Opioid Risk Score:   Fall Risk Score:   `1  Depression screen [facility] 2/9     02/25/2022    1:58 PM 12/17/2021   11:53 AM 10/15/2021   11:28 AM 08/23/2021   11:21 AM 08/01/2021   10:52 AM 12/25/2020    5:01 PM 05/11/2020    3:26 PM  Depression screen PHQ 2/9  Decreased Interest 1 0 0 0 2 2 0  Down, Depressed, Hopeless 2 0 1 1 3 1 1   PHQ - 2 Score 3 0 1 1 5 3 1   Altered sleeping 2 1 1  3 3  0  Tired, decreased energy 2 0 0  2 1 1   Change in appetite 2 1 0  2 1 1   Feeling bad or failure about yourself  1 0 1  1  0  Trouble concentrating 2 0 0  0 0 0  Moving slowly or fidgety/restless 0 0 2  1 0 0  Suicidal thoughts 0 0 0  0 0 0  PHQ-9 Score 12 2 5  14 8 3   Difficult doing work/chores Very difficult Not difficult at all Somewhat difficult  Somewhat difficult Somewhat difficult Not difficult at all     Review of Systems  Musculoskeletal:        Right shoulder and elbow pain  Psychiatric/Behavioral:  Positive for dysphoric mood and suicidal ideas. The patient is nervous/anxious.        Doesn't have an actual plan  All other systems reviewed and are negative.      Objective:   Physical Exam   PE: Constitution: Appropriate appearance for age. No apparent distress  Resp: No respiratory distress. No accessory muscle usage. on RA and CTAB Cardio: Well perfused appearance.  No peripheral edema. Abdomen: Nondistended. Nontender.   Psych: Flat affect, no active SI; some passive SI which she states is her baseline.  Neuro: AAOx4. No apparent cognitive deficits   Neurologic Exam:   DTRs: Reflexes were 2+ in bilateral  biceps, BR and triceps. Hoffmans: negative b/l  Intermittent spasms of entire RUE, large amplitude, induced by activity but resolve after 1 movement. Not consistent, involving alternating joints (wrist only once, entire arm another).  Sensory exam: revealed normal sensation in all dermatomal regions in bilateral upper extremities and with reduced sensation to light touch  in right lateral wrist only Motor exam:  strength 5/-5 throughout bilateral upper extremities Coordination: Fine motor coordination was normal.   Gait: normal   MSK: R arm No skin changes or appreciable deformities.  Full AROM R wrist, elbow, and shoulder; weakness in internal rotation only appreciated on Gerber Liftoff (not painful but could not perform).  + TTP R anterior shoulder, bicepital groove and short head attachment + Yeargason's and Speed's + TTP  L medial elbow; no pain on resisted wrist flexion or extension - Empty can, Neer's, Hawkin's, Apprehension, and Belly liftoff  Neck: Full AROM all planes No weakness appreciated - Spurling's     Assessment & Plan:   Natalie Leblanc is a 33 y.o. year old female  who  has a past medical history of COVID, Depression, and PTSD (post-traumatic stress disorder).   They are presenting to PM&R clinic as a new patient for treatment of right elbow pain . They were referred by Dr. Jean Rosenthal . Based on their presentation, she does have some components of L medial. Epicondylitis with now bicepital tendonitis likely from compensation over the past year.   Medial epicondylitis of elbow, right Bicipital tendonitis of shoulder, right Your exam today is was consistent with biceps tendonitis along with medial epicondylitis; I will be messaging Dr. Jean Rosenthal and your OT regarding targetting treatment for this moving forward, as I think compensating for your elbow is causing this pain.   In the meantime, because you did not tolerate oral NSAIDs, I recommend topical voltaren gel over the counter up to 4 times daily to the shoulder and elbow, along with ongoing therapy, bracing, and ice for inflammation. I have no other therapies to offer outside of what you are already getting based on your current symptoms.  Spasms of the hands or feet    We briefly discussed neurology referral or EMG with our office based on concerns for R arm spasms; given no sensory loss and no weakness on exam, I do  not think your exam is concerning for a neurologic process, but you can schedule an EMG if you would like to look into that.  Follow up as needed.

## 2023-06-17 ENCOUNTER — Encounter: Payer: Self-pay | Admitting: Physical Medicine and Rehabilitation

## 2023-06-17 ENCOUNTER — Encounter: Payer: 59 | Attending: Physical Medicine & Rehabilitation | Admitting: Physical Medicine and Rehabilitation

## 2023-06-17 VITALS — BP 118/79 | HR 73 | Ht 59.0 in | Wt 104.6 lb

## 2023-06-17 DIAGNOSIS — M7701 Medial epicondylitis, right elbow: Secondary | ICD-10-CM | POA: Diagnosis present

## 2023-06-17 DIAGNOSIS — M7521 Bicipital tendinitis, right shoulder: Secondary | ICD-10-CM | POA: Insufficient documentation

## 2023-06-17 DIAGNOSIS — R252 Cramp and spasm: Secondary | ICD-10-CM | POA: Insufficient documentation

## 2023-06-17 NOTE — Patient Instructions (Signed)
 Your exam today is was consistent with biceps tendonitis along with medial epicondylitis; I will be messaging Dr. Jean Rosenthal and your OT regarding targetting treatment for this moving forward, as I think compensating for your elbow is causing this pain.   In the meantime, because you did not tolerate oral NSAIDs, I recommend topical voltaren gel over the counter up to 4 times daily to the shoulder and elbow, along with ongoing therapy, bracing, and ice for inflammation. I have no other therapies to offer outside of what you are already getting based on your current symptoms.  We briefly discussed neurology referral or EMG with our office based on concerns for R arm spasms; given no sensory loss and no weakness on exam, I do not think your exam is concerning for a neurologic process, but you can schedule an EMG if you would like to look into that.  Follow up as needed.

## 2023-06-20 ENCOUNTER — Ambulatory Visit
Admission: RE | Admit: 2023-06-20 | Discharge: 2023-06-20 | Disposition: A | Discharge status: Home / Self Care | Source: Ambulatory Visit | Attending: Orthopaedic Surgery | Admitting: Orthopaedic Surgery

## 2023-06-20 DIAGNOSIS — G8929 Other chronic pain: Secondary | ICD-10-CM

## 2023-06-23 ENCOUNTER — Ambulatory Visit: Payer: 59 | Admitting: Occupational Therapy

## 2023-06-23 DIAGNOSIS — M6281 Muscle weakness (generalized): Secondary | ICD-10-CM

## 2023-06-23 DIAGNOSIS — M25511 Pain in right shoulder: Secondary | ICD-10-CM

## 2023-06-23 DIAGNOSIS — M25521 Pain in right elbow: Secondary | ICD-10-CM | POA: Diagnosis not present

## 2023-06-23 DIAGNOSIS — R208 Other disturbances of skin sensation: Secondary | ICD-10-CM

## 2023-06-23 NOTE — Therapy (Signed)
 OUTPATIENT OCCUPATIONAL THERAPY ORTHO  Treatment Note  Patient Name: Natalie Leblanc MRN: [id number] DOB:08-13-1990, 33 y.o., female Today's Date: 06/24/2023  PCP: Philip Aspen, Limmie Patricia, MD REFERRING PROVIDER: Richardean Sale, DO  END OF SESSION:  OT End of Session - 06/23/23 1323     Visit Number 5    Number of Visits 11    Date for OT Re-Evaluation 07/03/23    Authorization Type [employer] 2025    OT Start Time 1320    OT Stop Time 1400    OT Time Calculation (min) 40 min                 Past Medical History:  Diagnosis Date   COVID    Depression    PTSD (post-traumatic stress disorder)    Past Surgical History:  Procedure Laterality Date   BREAST LUMPECTOMY Left    benign per patient   Patient Active Problem List   Diagnosis Date Noted   Medial epicondylitis of elbow, right 06/17/2023   Bicipital tendonitis of shoulder, right 06/17/2023   Spasms of the hands or feet 06/17/2023   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 12/28/2019   Vitamin D deficiency 12/23/2019   Depression    PTSD (post-traumatic stress disorder)     ONSET DATE: referral date 04/27/23 (initial injury   REFERRING DIAG: M25.521 (ICD-10-CM) - Right elbow pain M77.01 (ICD-10-CM) - Medial epicondylitis of right elbow  THERAPY DIAG:  Pain in right elbow  Muscle weakness  Other disturbances of skin sensation  Right shoulder pain, unspecified chronicity  Rationale for Evaluation and Treatment: Rehabilitation  SUBJECTIVE:   SUBJECTIVE STATEMENT: Pt reports having pain while working yesterday.  Pt reports working with her supervisor to get work accommodations due to unhealthy work environment as some of her co-workers are making aspects of her job harder on her.    Pt reports not having a heavy work load recently.   Pt accompanied by: self  PERTINENT HISTORY: S/P Right Ganglion Cyst removal on 10/16/2022, PTSD   PRECAUTIONS: Other: previously with 20 pound  lifting restriction per Dr Edison Pace note - however this has most likely lapsed  RED FLAGS: None   WEIGHT BEARING RESTRICTIONS: No  PAIN:  Are you having pain? No, stating "I've not done anything today"  FALLS: Has patient fallen in last 6 months? 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  HAND DOMINANCE: Right  ADLs: Overall ADLs: Increased pain and effort sometimes when bathing and donning shirt, some pain and limitations when doffing jacket  FUNCTIONAL OUTCOME MEASURES:     UPPER EXTREMITY ROM:     Active ROM Right eval Left eval  Shoulder flexion 140   Shoulder abduction 175   Shoulder adduction    Shoulder extension    Shoulder internal rotation 95%, reports increased pain with this movement   Shoulder external rotation 100%, mild pain at end  range   Elbow flexion    Elbow extension    Wrist flexion 65   Wrist extension 49   Wrist ulnar deviation    Wrist radial deviation    Wrist pronation    Wrist supination    (Blank rows = not tested)   UPPER EXTREMITY MMT:    Right shoulder and elbow strength of 4/5 Left shoulder/UE strength is 4+ to 5/5 grossly throughout  HAND FUNCTION: Grip strength: Right: 6 lbs; Left: 15 lbs in stress position. Left: 30 lbs in traditional position.  COORDINATION: 9 Hole Peg test: Right: 35.93 (dropped one  and reports "awkward feeling" sec; Left: 29.44 sec  SENSATION: Pt reports that she has nerve pain "like when you hit your funny bone" at some points  EDEMA: no swelling  COGNITION: Overall cognitive status: Within functional limits for tasks assessed  OBSERVATIONS:  Pt grimacing in pain with elbow flexion/extension and supination/pronation and rubbing forearm and up to elbow after attempts at grip strengthening.  Pt reporting limited strength and endurance with overhead reach with RUE.   TREATMENT DATE:  06/16/23 UBE: UBE: level 1.4 x 4 mins forward and 4 mins backward for a total of 8 mins with focus on increased endurance.  Pt tolerating increased activity this session.  Pt reporting mild "ache" when going backwards, but not painful.  Theraband: utilized red theraband anchored in doorway with OT providing intermittent tactile cues to facilitate increased positioning and ROM. - Standing Shoulder Internal Rotation with Anchored Resistance  - 2 sets - 5-10 reps - Shoulder External Rotation with Anchored Resistance  -  2 sets - 5-10 reps Shoulder ROM: engaged in wall slides in standing with focus on shoulder ROM and hold for stretch.   Sleeper stretch: educated on positioning in standing and sidelying, however pt demonstrating difficulty maintaining alignment in standing therefore completed in sidelying on mat table with improved positioning.  Pt reporting pain with internal rotation initially therefore educated on decreasing pressure and maintaining stretch as tolerated for ~15 seconds.      06/09/23 Sleeping: OT educating pt in sleeping on side to allow for improved positioning of RUE as pt reporting preferring to sleep on side or stomach.  Encouraged support under R arm to allow for improved positioning. Kinesiotape: pt tolerated kinesiotape until Friday morning, reporting that it did provide additional support and gave arm "room to breathe" to allow pt to participate in work tasks.  Pt  agreeable to attempting again. OT applied kinesiotape to R medial aspect of elbow above olecranon process at area of pain/tenderness.  OT applied "X" pattern over area of pain with 80% stretch and then applied longer tape from medial aspect of upper arm, over medial aspect of elbow and anchored at mid forearm with 50% stretch to provide support and decrease pain.  Therex:  WB through BUE on elevated table top.  Engaged in alternating reaching to targets 2 sets of 5, followed by shoulder taps x10 to facilitate increased BUE strengthening as needed for endurance and overhead reaching with work tasks.   Wall slides with focus on shoulder flexion.  Completing x10 with sustained hold for 10 seconds Elbow extension with red theraband, 2 sets of 10 with cues for resistance and technique.      06/02/23 RUE AROM/stretch: OT providing demonstration and education on rationale of ROM and increased activity tolerance. ulnar nerve glide x5 Wrist prayer stretch x30" Rt wrist ext stretch x30" Scapular retraction x5 Kinesiotape: OT applied kinesiotape to R medial  aspect of elbow above olecranon process at area of pain/tenderness.  OT applied "X" pattern over area of pain with 80% stretch and then applied longer tape from medial aspect of upper arm, over medial aspect of elbow and anchored at mid forearm with 50% stretch to provide support and decrease pain.  Pt also asking for tape at R shoulder.  Pt pointing to area of pain in trapezius near crook of neck, therefore OT applied "X" pattern over area of pain with 50-75% stretch for pain relief.   Self-care: OT educated on rationale of kinesiotape and typical wear time and how to remove at end of cycle and/or if irritation occurs prior to 4-5 days time.  Pt provided with handout from [employer], see below education.     PATIENT EDUCATION: Education details: pain management, resources for counseling  Person educated: Patient Education method: Explanation, Verbal  cues, and Handouts Education comprehension: verbalized understanding and needs further education  HOME EXERCISE PROGRAM: Access Code: [email or web address] URL: [email or web address] Date: 05/20/2023 Prepared by: [facility] - Outpatient  Rehab - [facility]  Exercises - Seated Shoulder Shrugs  - 2-3 x daily - 10 reps - Seated Shoulder Shrug Circles AROM Forward  - 2-3 x daily - 10 reps  Access Code: [email or web address] URL: [email or web address] Date: 06/23/2023 Prepared by: [facility] - Outpatient  Rehab - [facility]  Exercises - Standing Ulnar Nerve Glide  - 1 x daily - 7 x weekly - 2 sets - 10 reps - Seated Wrist Extension Stretch  - 1 x daily - 7 x weekly - 2 reps - 20 sec hold - Wrist Prayer Stretch  - 1 x daily - 7 x weekly - 2 reps - 20 sec hold - Seated Scapular Retraction  - 1 x daily - 7 x weekly - 2 sets - 10 reps - Standing shoulder flexion wall slides  - 1 x daily - 7 x weekly - 2 sets - 10 reps - Full Plank on Counter, Opposite Shoulder Taps  - 1 x daily - 7 x weekly - 2 sets - 10 reps - Seated Elbow Extension with Self-Anchored Resistance  - 1 x daily - 3 x weekly - 2 sets - 10 reps - Standing Shoulder Internal Rotation with Anchored Resistance  - 1 x daily - 3 x weekly - 2 sets - 5-10 reps - Shoulder External Rotation with Anchored Resistance  - 1 x daily - 3 x weekly - 2 sets - 5-10 reps - Sleeper Stretch  - 1 x daily - 3 x weekly - 3 reps - 15-20 sec hold  Patient Education - Kinesiology tape  GOALS: Goals reviewed with patient? Yes  SHORT TERM GOALS: Target date: 06/12/23  Pt will be independent with initial HEP. Baseline: return to OP Goal status: IN PROGRESS  2.  Pt will report understanding of use of modalities and splinting for decreased pain. Baseline: pain 3/10 at rest, but up to 8/10 with work tasks Goal status: IN PROGRESS  3.  Pt will report understanding of joint protection strategies to aid in decreased pain with ADLs, work, and  home management tasks. Baseline: chronic pain with return to work Goal status: IN PROGRESS   LONG TERM GOALS: Target date: 07/03/23  Pt will be independent with advanced HEP.  Baseline: return to OP Goal status: IN PROGRESS  2.  Pt will increase right grip strength to at least 15 pounds in stress positioning to allow her to perform job related tasks. Baseline: 6# on  Right, 15# on Left Goal status: IN PROGRESS  3.  Patient will report no increase in pain with playing video games and with writing/drawing. Baseline: pain up to 8/10 with repetitive tasks Goal status: IN PROGRESS  4.  Pt will demonstrate increased R shoulder flexion to 155* to allow for increased ease with placing items into cabinet or high shelf. Baseline: 140* Goal status: IN PROGRESS  5.  Pt will increase right UE strength to at least 4+/5 to allow her to perform job and home management related tasks (dumping litter box) with increased ease. Baseline: grossly 4/5 Goal status: IN PROGRESS  6. Pt will improve functional ability by decreased impairment per Quick DASH assessment from 56% to 40% or better, for better quality of life.  Baseline: Quick dash 56.8/100 Goal status: IN PROGRESS  ASSESSMENT:  CLINICAL IMPRESSION: Pt tolerating increased resistance against min workload on UBE as well as resistance with red theraband with shoulder internal and external rotation.  Pt continues to report pain in shoulder > elbow at times which may be due to guarding at elbow.  Pt will continue to benefit from skilled occupational therapy to address pain, ROM, and strength as needed for ADLs, IADLs, and leisure pursuits.    PERFORMANCE DEFICITS: in functional skills including ADLs, IADLs, coordination, sensation, edema, ROM, strength, pain, Fine motor control, Gross motor control, body mechanics, endurance, decreased knowledge of precautions, decreased knowledge of use of DME, and UE functional use and psychosocial skills including  environmental adaptation and routines and behaviors.      PLAN:  OT FREQUENCY: 1-2x/week  OT DURATION: 6 weeks  PLANNED INTERVENTIONS: 97168 OT Re-evaluation, 97535 self care/ADL training, 40981 therapeutic exercise, 97530 therapeutic activity, 97112 neuromuscular re-education, 97140 manual therapy, 97035 ultrasound, 97010 moist heat, 97010 cryotherapy, 97033 iontophoresis, 97760 Orthotics management and training, 19147 Splinting (initial encounter), [id number] Subsequent splinting/medication, passive range of motion, compression bandaging, psychosocial skills training, energy conservation, coping strategies training, patient/family education, and DME and/or AE instructions  RECOMMENDED OTHER SERVICES: NA  CONSULTED AND AGREED WITH PLAN OF CARE: Patient  PLAN FOR NEXT SESSION: review massage strategies - PRN, educate on use of modalities to decrease pain -  How did Kinesiotape go - want to continue with it? Add to shoulder and elbow AROM. - incorporate WB and use of red theraband for shoulder and elbow strength, may complete with anchored in door frame  Biceps tendonitis? Focus on shoulder strengthening and stability as well   Rosalio Loud, OTR/L 06/24/2023, 9:20 AM  [facility]patient Rehab at [facility] [address] [place], [address] [place], [place] [address] Phone # [phone] Fax # [phone]

## 2023-06-26 ENCOUNTER — Ambulatory Visit (HOSPITAL_BASED_OUTPATIENT_CLINIC_OR_DEPARTMENT_OTHER): Payer: 59 | Admitting: Orthopaedic Surgery

## 2023-06-26 DIAGNOSIS — M25561 Pain in right knee: Secondary | ICD-10-CM

## 2023-06-26 DIAGNOSIS — G8929 Other chronic pain: Secondary | ICD-10-CM

## 2023-06-26 MED ORDER — LIDOCAINE HCL 1 % IJ SOLN
4.0000 mL | INTRAMUSCULAR | Status: AC | PRN
  Administered 2023-06-26: 4 mL

## 2023-06-26 MED ORDER — TRIAMCINOLONE ACETONIDE 40 MG/ML IJ SUSP
80.0000 mg | INTRAMUSCULAR | Status: AC | PRN
  Administered 2023-06-26: 80 mg via INTRA_ARTICULAR

## 2023-06-26 NOTE — Progress Notes (Signed)
 Chief Complaint: Right knee pain     History of Present Illness:   06/26/2023: Presents today for follow-up of the left knee and MRI discussion  Natalie Leblanc is a 33 y.o. female presents today with ongoing persistent right knee pain for the last several months.  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  She has been experiencing medial based knee pain and making it very difficult to get through an entire day.  There is popping and clicking.  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    PMH/PSH/Family History/Social History/Meds/Allergies:    Past Medical History:  Diagnosis Date  . COVID   . Depression   . PTSD (post-traumatic stress disorder)    Past Surgical History:  Procedure Laterality Date  . BREAST LUMPECTOMY Left    benign per patient   Social History   Socioeconomic History  . Marital status: Single    Spouse name: Not on file  . Number of children: Not on file  . Years of education: Not on file  . Highest education level: 12th grade  Occupational History  . Not on file  Tobacco Use  . Smoking status: Never  . Smokeless tobacco: Never  Vaping Use  . Vaping status: Never Used  Substance and Sexual Activity  . Alcohol use: Yes    Comment: occasional  . Drug use: No  . Sexual activity: Yes  Other Topics Concern  . Not on file  Social History Narrative  . Not on file   Social Drivers of Health   Financial Resource Strain: Low Risk  (07/30/2021)   Overall Financial Resource Strain (CARDIA)   . Difficulty of Paying Living Expenses: Not very hard  Food Insecurity: No Food Insecurity (07/30/2021)   Hunger Vital Sign   . Worried About Programme researcher, broadcasting/film/video in the Last Year: Never true   . Ran Out of Food in the Last Year: Never true  Transportation Needs: No Transportation Needs (07/30/2021)   PRAPARE -  Transportation   . Lack of Transportation (Medical): No   . Lack of Transportation (Non-Medical): No  Physical Activity: Unknown (07/30/2021)   Exercise Vital Sign   . Days of Exercise per Week: Patient declined   . Minutes of Exercise per Session: Not on file  Stress: Stress Concern Present (07/30/2021)   [employer] of Occupational Health - Occupational Stress Questionnaire   . Feeling of Stress : Very much  Social Connections: Unknown (08/05/2021)   [facility] from [facility]   Social Network   . Social Network: Not on file  Recent Concern: Social Connections - Socially Isolated (07/30/2021)   Social Connection and Isolation Panel [NHANES]   . Frequency of Communication with Friends and Family: Twice a week   . Frequency of Social Gatherings with Friends and Family: Once a week   . Attends Religious Services: Never   . Active Member of Clubs or Organizations: No   . Attends Banker Meetings: Not on file   . Marital Status: Never married   Family History  Problem Relation Age of Onset  . Gestational diabetes Mother   . Alcohol abuse Mother   . Mental illness Mother   . Eczema Sister   . Depression Sister   . Psychosis Maternal Aunt   . Thyroid disease  Sister   . Depression Sister   . Heart murmur Sister    Allergies  Allergen Reactions  . Fish Allergy     GI upset  . Mobic [Meloxicam]   . Other Itching    Animal Dander   Current Outpatient Medications  Medication Sig Dispense Refill  . hydrOXYzine (ATARAX) 25 MG tablet Take 25 mg by mouth at bedtime as needed.    . Multiple Vitamin (MULTIVITAMIN) capsule Take 1 capsule by mouth daily.    . rizatriptan (MAXALT) 10 MG tablet Take 1 tablet (10 mg total) by mouth as needed for migraine. May repeat in 2 hours if needed 10 tablet 6  . sertraline (ZOLOFT) 100 MG tablet Take 100 mg by mouth daily.     No current facility-administered medications for this visit.   No results found.  Review  of Systems:   A ROS was performed including pertinent positives and negatives as documented in the HPI.  Physical Exam :   Constitutional: NAD and appears stated age Neurological: Alert and oriented Psych: Appropriate affect and cooperative There were no vitals taken for this visit.   Comprehensive Musculoskeletal Exam:    Right knee with tenderness about the medial joint line, negative Lachman, positive McMurray.  Mild effusion.  Range of motion is -3 to 235 degrees.  Distal neurosensory exam is intact   Imaging:   Xray (4 views right knee): Normal    I personally reviewed and interpreted the radiographs.   Assessment and Plan:   33 y.o. female with evidence of possible right knee medial meniscal tear.  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  Given this I did provide a intra-articular injection of the right knee.  I will plan to see her back as needed  -Right knee injection provided after verbal consent obtained  Procedure Note  Patient: Natalie Leblanc             Date of Birth: [birth year]           MRN: [id number]             Visit Date: 06/26/2023  Procedures: Visit Diagnoses: No diagnosis found.  Large Joint Inj: R knee on 06/26/2023 12:45 PM Indications: pain Details: 22 G 1.5 in needle, ultrasound-guided anterior approach  Arthrogram: No  Medications: 4 mL lidocaine 1 %; 80 mg triamcinolone acetonide 40 MG/ML Outcome: tolerated well, no immediate complications Procedure, treatment alternatives, risks and benefits explained, specific risks discussed. Consent was given by the patient. Immediately prior to procedure a time out was called to verify the correct patient, procedure, equipment, support staff and site/side marked as required. Patient was prepped and draped in the usual sterile fashion.       I personally saw and evaluated the patient, and participated in the management and treatment plan.  Huel Cote, MD Attending  Physician, Orthopedic Surgery  This document was dictated using Dragon voice recognition software. A reasonable attempt at proof reading has been made to minimize errors.

## 2023-06-29 ENCOUNTER — Encounter (HOSPITAL_BASED_OUTPATIENT_CLINIC_OR_DEPARTMENT_OTHER): Payer: Self-pay | Admitting: Orthopaedic Surgery

## 2023-06-30 ENCOUNTER — Ambulatory Visit: Payer: 59 | Admitting: Occupational Therapy

## 2023-06-30 DIAGNOSIS — M6281 Muscle weakness (generalized): Secondary | ICD-10-CM

## 2023-06-30 DIAGNOSIS — M25511 Pain in right shoulder: Secondary | ICD-10-CM

## 2023-06-30 DIAGNOSIS — M25521 Pain in right elbow: Secondary | ICD-10-CM

## 2023-06-30 DIAGNOSIS — R208 Other disturbances of skin sensation: Secondary | ICD-10-CM

## 2023-06-30 NOTE — Therapy (Signed)
 OUTPATIENT OCCUPATIONAL THERAPY ORTHO  Treatment Note  Patient Name: Natalie Leblanc MRN: [id number] DOB:09-[name]-1990, 33 y.o., female Today's Date: 06/30/2023  PCP: Philip Aspen, Limmie Patricia, MD REFERRING PROVIDER: Richardean Sale, DO  END OF SESSION:  OT End of Session - 06/30/23 1329     Visit Number 6    Number of Visits 11    Date for OT Re-Evaluation 07/03/23    Authorization Type [employer] [id number]    OT Start Time 1320    OT Stop Time 1400    OT Time Calculation (min) 40 min                  Past Medical History:  Diagnosis Date   COVID    Depression    PTSD (post-traumatic stress disorder)    Past Surgical History:  Procedure Laterality Date   BREAST LUMPECTOMY Left    benign per patient   Patient Active Problem List   Diagnosis Date Noted   Medial epicondylitis of elbow, right 06/17/2023   Bicipital tendonitis of shoulder, right 06/17/2023   Spasms of the hands or feet 06/17/2023   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 12/28/2019   Vitamin D deficiency 12/23/2019   Depression    PTSD (post-traumatic stress disorder)     ONSET DATE: referral date 04/27/23 (initial injury   REFERRING DIAG: M25.521 (ICD-10-CM) - Right elbow pain M77.01 (ICD-10-CM) - Medial epicondylitis of right elbow  THERAPY DIAG:  Pain in right elbow  Muscle weakness  Other disturbances of skin sensation  Right shoulder pain, unspecified chronicity  Rationale for Evaluation and Treatment: Rehabilitation  SUBJECTIVE:   SUBJECTIVE STATEMENT: Pt reports sleeping weird last night, so her back is sore.  Pt reports mild discomfort with theraband exercises, but also unsure if she was doing them correctly.     Pt accompanied by: self  PERTINENT HISTORY: S/P Right Ganglion Cyst removal on 10/16/2022, PTSD   PRECAUTIONS: Other: previously with 20 pound lifting restriction per Dr Edison Pace note - however this has most likely lapsed  RED  FLAGS: None   WEIGHT BEARING RESTRICTIONS: No  PAIN:  Are you having pain? No, stating "I've not done anything today"  FALLS: Has patient fallen in last 6 months? 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  HAND DOMINANCE: Right  ADLs: Overall ADLs: Increased pain and effort sometimes when bathing and donning shirt, some pain and limitations when doffing jacket  FUNCTIONAL OUTCOME MEASURES:     UPPER EXTREMITY ROM:     Active ROM Right eval Left eval Right 06/30/23  Shoulder flexion 140  150  Shoulder abduction 175    Shoulder adduction     Shoulder extension     Shoulder internal rotation 95%, reports increased pain with this movement    Shoulder external rotation 100%, mild pain at end range    Elbow flexion     Elbow  extension     Wrist flexion 65    Wrist extension 49    Wrist ulnar deviation     Wrist radial deviation     Wrist pronation     Wrist supination     (Blank rows = not tested)   UPPER EXTREMITY MMT:    Right shoulder and elbow strength of 4/5 Left shoulder/UE strength is 4+ to 5/5 grossly throughout  06/30/23: RUE: Right shoulder 4/5 and elbow strength 4/5  HAND FUNCTION: Grip strength: Right: 6 lbs; Left: 15 lbs in stress position. Left: 30 lbs in traditional position.  COORDINATION: 9 Hole Peg test: Right: 35.93 (dropped  one and reports "awkward feeling" sec; Left: 29.44 sec  SENSATION: Pt reports that she has nerve pain "like when you hit your funny bone" at some points  EDEMA: no swelling  COGNITION: Overall cognitive status: Within functional limits for tasks assessed  OBSERVATIONS:  Pt grimacing in pain with elbow flexion/extension and supination/pronation and rubbing forearm and up to elbow after attempts at grip strengthening.  Pt reporting limited strength and endurance with overhead reach with RUE.   TREATMENT DATE:  06/30/23 Self-care: engaged in discussion about desired plans for further intervention.  Discussed kinesiotape, pt reports helpful while wearing it but understanding that it is not a long term solution as it needs to be reapplied after 3-4 days.  Pt reports dry needling was helping with her shoulder but unsure if she can tolerate it in her elbow without some sort of numbing agent.  Discussed return to [facility] to allow for dry needling.   Therapeutic activity: strength and ROM assessed (see above measurements) with pt making progress, however still not at goal level  Theraband: OT providing demonstration of each exercise and min cues during completion for improved positioning and technique.  OT then upgrading horizontal adduction with completion at chest height progressing to overhead height to facilitate carryover to work tasks requiring overhead movements. - Standing Elbow Extension with Self-Anchored Resistance  -  2 sets - 10 reps - Standing Shoulder Internal Rotation with Anchored Resistance  -  2 sets - 10 reps - Shoulder External Rotation with Anchored Resistance  - 2 sets - 10 reps - Standing Shoulder Horizontal Adduction with Anchored Resistance  - 2 sets - 5 reps in each position (chest and overhead)    06/23/23 UBE: UBE: level 1.4 x 4 mins forward and 4 mins backward for a total of 8 mins with focus on increased endurance.  Pt tolerating increased activity this session.  Pt  reporting mild "ache" when going backwards, but not painful.  Theraband: utilized red theraband anchored in doorway with OT providing intermittent tactile cues to facilitate increased positioning and ROM. - Standing Shoulder Internal Rotation with Anchored Resistance  - 2 sets - 5-10 reps - Shoulder External Rotation with Anchored Resistance  -  2 sets - 5-10 reps Shoulder ROM: engaged in wall slides in standing with focus on shoulder ROM and hold for stretch.   Sleeper stretch: educated on positioning in standing and sidelying, however pt demonstrating difficulty maintaining alignment in standing therefore completed in sidelying on mat table with improved positioning.  Pt reporting pain with internal rotation initially therefore educated on decreasing pressure and maintaining stretch as tolerated for ~15 seconds.      06/09/23 Sleeping: OT educating pt in sleeping on side to allow for improved positioning of RUE as pt reporting preferring to sleep on side or stomach.  Encouraged support under R  arm to allow for improved positioning. Kinesiotape: pt tolerated kinesiotape until Friday morning, reporting that it did provide additional support and gave arm "room to breathe" to allow pt to participate in work tasks.  Pt agreeable to attempting again. OT applied kinesiotape to R medial aspect of elbow above olecranon process at area of pain/tenderness.  OT applied "X" pattern over area of pain with 80% stretch and then applied longer tape from medial aspect of upper arm, over medial aspect of elbow and anchored at mid forearm with 50% stretch to provide support and decrease pain.  Therex:  WB through BUE on elevated table top.  Engaged in alternating reaching to targets 2 sets of 5, followed by shoulder taps x10 to facilitate increased BUE strengthening as needed for endurance and overhead reaching with work tasks.   Wall slides with focus on shoulder flexion.  Completing x10 with sustained hold for 10  seconds Elbow extension with red theraband, 2 sets of 10 with cues for resistance and technique.      PATIENT EDUCATION: Education details: pain management, resources for counseling  Person educated: Patient Education method: Explanation, Verbal cues, and Handouts Education comprehension: verbalized understanding and needs further education  HOME EXERCISE PROGRAM: Access Code: [email or web address] URL: [email or web address] Date: 05/20/2023 Prepared by: [facility] - Outpatient  Rehab - [facility]  Exercises - Seated Shoulder Shrugs  - 2-3 x daily - 10 reps - Seated Shoulder Shrug Circles AROM Forward  - 2-3 x daily - 10 reps  Access Code: [email or web address] URL: [email or web address] Date: 06/23/2023 Prepared by: [facility] - Outpatient  Rehab - [facility]  Exercises - Standing Ulnar Nerve Glide  - 1 x daily - 7 x weekly - 2 sets - 10 reps - Seated Wrist Extension Stretch  - 1 x daily - 7 x weekly - 2 reps - 20 sec hold - Wrist Prayer Stretch  - 1 x daily - 7 x weekly - 2 reps - 20 sec hold - Seated Scapular Retraction  - 1 x daily - 7 x weekly - 2 sets - 10 reps - Standing shoulder flexion wall slides  - 1 x daily - 7 x weekly - 2 sets - 10 reps - Full Plank on Counter, Opposite Shoulder Taps  - 1 x daily - 7 x weekly - 2 sets - 10 reps - Seated Elbow Extension with Self-Anchored Resistance  - 1 x daily - 3 x weekly - 2 sets - 10 reps - Standing Shoulder Internal Rotation with Anchored Resistance  - 1 x daily - 3 x weekly - 2 sets - 5-10 reps - Shoulder External Rotation with Anchored Resistance  - 1 x daily - 3 x weekly - 2 sets - 5-10 reps - Sleeper Stretch  - 1 x daily - 3 x weekly - 3 reps - 15-20 sec hold  Patient Education - Kinesiology tape  GOALS: Goals reviewed with patient? Yes  SHORT TERM GOALS: Target date: 06/12/23  Pt will be independent with initial HEP. Baseline: return to OP Goal status: IN PROGRESS  2.  Pt will report understanding of  use of modalities and splinting for decreased pain. Baseline: pain 3/10 at rest, but up to 8/10 with work tasks Goal status: IN PROGRESS  3.  Pt will report understanding of joint protection strategies to aid in decreased pain with ADLs, work, and home management tasks. Baseline: chronic pain with return to work Goal status: IN PROGRESS   LONG TERM GOALS: Target date: 07/03/23  Pt will be independent with advanced HEP.  Baseline: return to OP Goal status: MET   2.  Pt will increase right grip strength to at least 15 pounds in stress positioning to allow her to perform job related tasks. Baseline: 6# on Right, 15# on Left 06/30/23: 10# on Right Goal status: NOT MET  3.  Patient will report no increase in pain with playing video games and with writing/drawing. Baseline: pain up to 8/10 with repetitive tasks Goal status: IN PROGRESS  4.  Pt will demonstrate increased R shoulder flexion to 155* to allow for increased ease with placing items into cabinet or high shelf. Baseline: 140* 06/30/23: 150* RUE Goal status: NOT MET  5.  Pt will increase right UE strength to at least 4+/5 to allow her to perform job and home management related tasks (dumping litter box) with increased ease. Baseline: grossly 4/5 Goal status: NOT MET  6. Pt will improve functional ability by decreased impairment per Quick DASH assessment from 56% to 40% or better, for better quality of life.  Baseline: Quick dash 56.8/100 Goal status: IN PROGRESS  ASSESSMENT:  CLINICAL IMPRESSION: Pt has made minimal progress since re-cert due to ongoing pain and weakness.  Pt has reported that dry needling was helpful when it was tried before at a different clinic, however it was too tender at her elbow site.  Pt has expressed desire to continue with dry needling for shoulder and would be willing to attempt at elbow as well if there were some sort of "numbing agent".  At this point, due to underlying pain pt has maximized her  potential with therapy at this clinic.  Pt and therapist in agreement to have pt try dry needling again at [facility] to see if pain can be treated in that manner.  At this time, plan to defer for  ~4 weeks to determine continued necessity of skilled OT services or if change to orthopedic PT and dry needling is aiding in reducing the pain. Pt encouraged to call back before that time if wanting to resume OT services.  Pt to be discharged from OP OT if no further therapy is warranted by 08/06/23.   PERFORMANCE DEFICITS: in functional skills including ADLs, IADLs, coordination, sensation, edema, ROM, strength, pain, Fine motor control, Gross motor control, body mechanics, endurance, decreased knowledge of precautions, decreased knowledge of use of DME, and UE functional use and psychosocial skills including environmental adaptation and routines and behaviors.      PLAN:  OT FREQUENCY: 1-2x/week  OT DURATION: 6 weeks  PLANNED INTERVENTIONS: 97168 OT Re-evaluation, 97535 self care/ADL training, 40981 therapeutic exercise, 97530 therapeutic activity, 97112 neuromuscular re-education, 97140 manual therapy, 97035 ultrasound, 97010 moist heat, 97010 cryotherapy, 97033 iontophoresis, 97760 Orthotics management and training, 19147 Splinting (initial encounter), [id number] Subsequent splinting/medication, passive range of motion, compression bandaging, psychosocial skills training, energy conservation, coping strategies training, patient/family education, and DME and/or AE instructions  RECOMMENDED OTHER SERVICES: NA  CONSULTED AND AGREED WITH PLAN OF CARE: Patient  PLAN FOR NEXT SESSION: review massage strategies - PRN, educate on use of modalities to decrease pain -  How did Kinesiotape go - want to continue with it? Add to shoulder and elbow AROM. - incorporate WB and use of red theraband for shoulder and elbow strength, may complete with anchored in door frame  Biceps tendonitis? Focus on shoulder  strengthening and stability as well   Rosalio Loud, OTR/L 06/30/2023, 4:34 PM  [facility]  Neuro [address] [place], [place] [address] Phone # [phone] Fax # [phone]

## 2023-07-01 ENCOUNTER — Other Ambulatory Visit: Payer: Self-pay | Admitting: Sports Medicine

## 2023-07-01 DIAGNOSIS — M7701 Medial epicondylitis, right elbow: Secondary | ICD-10-CM

## 2023-07-01 DIAGNOSIS — M25521 Pain in right elbow: Secondary | ICD-10-CM

## 2023-07-01 DIAGNOSIS — M25519 Pain in unspecified shoulder: Secondary | ICD-10-CM

## 2023-07-03 ENCOUNTER — Ambulatory Visit: Payer: 59 | Admitting: Physical Medicine & Rehabilitation

## 2023-07-13 ENCOUNTER — Ambulatory Visit: Admitting: Physical Therapy

## 2023-07-22 ENCOUNTER — Ambulatory Visit: Attending: Sports Medicine | Admitting: Rehabilitative and Restorative Service Providers"

## 2023-07-22 ENCOUNTER — Encounter: Payer: Self-pay | Admitting: Rehabilitative and Restorative Service Providers"

## 2023-07-22 ENCOUNTER — Other Ambulatory Visit: Payer: Self-pay

## 2023-07-22 DIAGNOSIS — M25519 Pain in unspecified shoulder: Secondary | ICD-10-CM | POA: Insufficient documentation

## 2023-07-22 DIAGNOSIS — M25521 Pain in right elbow: Secondary | ICD-10-CM | POA: Diagnosis present

## 2023-07-22 DIAGNOSIS — M6281 Muscle weakness (generalized): Secondary | ICD-10-CM | POA: Diagnosis present

## 2023-07-22 DIAGNOSIS — M25511 Pain in right shoulder: Secondary | ICD-10-CM | POA: Insufficient documentation

## 2023-07-22 DIAGNOSIS — R252 Cramp and spasm: Secondary | ICD-10-CM | POA: Diagnosis present

## 2023-07-22 DIAGNOSIS — M7701 Medial epicondylitis, right elbow: Secondary | ICD-10-CM | POA: Diagnosis present

## 2023-07-22 DIAGNOSIS — R208 Other disturbances of skin sensation: Secondary | ICD-10-CM | POA: Diagnosis present

## 2023-07-22 NOTE — Therapy (Signed)
 OUTPATIENT PHYSICAL THERAPY UPPER EXTREMITY EVALUATION   Patient Name: Natalie Leblanc MRN: [id number] DOB:December 08, 1990, 33 y.o., female Today's Date: 07/22/2023  END OF SESSION:  PT End of Session - 07/22/23 1156     Visit Number 1    Date for PT Re-Evaluation 09/11/23    Authorization Type [employer]    PT Start Time 1151    PT Stop Time 1225    PT Time Calculation (min) 34 min    Activity Tolerance Patient tolerated treatment well    Behavior During Therapy WFL for tasks assessed/performed              Past Medical History:  Diagnosis Date   COVID    Depression    PTSD (post-traumatic stress disorder)    Past Surgical History:  Procedure Laterality Date   BREAST LUMPECTOMY Left    benign per patient   Patient Active Problem List   Diagnosis Date Noted   Medial epicondylitis of elbow, right 06/17/2023   Bicipital tendonitis of shoulder, right 06/17/2023   Spasms of the hands or feet 06/17/2023   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 12/28/2019   Vitamin D deficiency 12/23/2019   Depression    PTSD (post-traumatic stress disorder)     PCP: Zilphia Hilt, Charyl Coppersmith, MD  REFERRING PROVIDER: Ulysees Gander, DO  REFERRING DIAG: [phone] (ICD-10-CM) - Right elbow pain M77.01 (ICD-10-CM) - Medial epicondylitis of right elbow M25.519 (ICD-10-CM) - Arthralgia of shoulder, unspecified laterality  THERAPY DIAG:  Pain in right elbow - Plan: PT plan of care cert/re-cert  Muscle weakness - Plan: PT plan of care cert/re-cert  Other disturbances of skin sensation - Plan: PT plan of care cert/re-cert  Right shoulder pain, unspecified chronicity - Plan: PT plan of care cert/re-cert  Medial epicondylitis of elbow, right - Plan: PT plan of care cert/re-cert  Cramp and spasm - Plan: PT plan of care cert/re-cert  Rationale for Evaluation and Treatment: Rehabilitation  ONSET DATE: Last September (but had this injury in the past before)  SUBJECTIVE:                                                                                                                                                                                       SUBJECTIVE STATEMENT: Pt reports that she recently complete OT services and was recommended to return to PT to try dry needling again.  Patient states that she has been doing her exercises from OT and has been doing her arm dexterity exercises.  Patient states that her elbow is improved from last time she came to PT, but her shoulder is more bothersome now.  Hand dominance:  Right  PERTINENT HISTORY: S/P Right Ganglion Cyst removal on 10/16/2022, PTSD, History of right knee pain (possible right knee medial meniscal tear vs medial plical type syndrome following Dr Hermina Loosen)  PAIN:  Are you having pain? Yes: NPRS scale: 6/10 Pain location: right medial elbow and right shoulder Pain description: sharp Aggravating factors: movement, pushing, reaching Relieving factors: medication, PT helped in the past  PRECAUTIONS: Other: Per documentation by Dr Cleora Daft, no official restrictions, however, he does feel that repetitive motions and heavy lifting do contribute to increased pain  RED FLAGS: None   WEIGHT BEARING RESTRICTIONS: No  FALLS:  Has patient fallen in last 6 months? 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 No specific internal derangement is identified to explain the patient's  symptoms.  PATIENT SURVEYS :  Eval:  Quick Dash 40.91  COGNITION: Overall cognitive status: Within functional limits for tasks assessed     SENSATION: Patient denies  POSTURE: Rounded shoulders, forward flexed  UPPER EXTREMITY ROM:   Eval:  Pt has ROM WFL, however, has pain with right elbow flexion/extension and supination/pronation  UPPER EXTREMITY MMT:  Eval:   Right shoulder and elbow strength of 4/5 Left shoulder/UE strength is 4+ to 5/5 grossly throughout Right grip strength of 28 lbs Left grip strength of 32 lbs Right key pinch strength is 10 pounds Left key pinch strength is 15 pounds  SHOULDER SPECIAL TESTS: Impingement tests: Hawkins/Kennedy impingement test: negative  Rotator cuff assessment: Empty can test: negative    PALPATION:  Tender to palpation around right shoulder region and biceps tendon insertion. Tenderness with muscle spasms noted in right medial elbow region   TODAY'S TREATMENT:                                                                                                                                          DATE: 07/22/2023 Issued HEP (see below) Trigger Point Dry Needling Initial Treatment: Pt instructed on Dry Needling rational, procedures, and possible side effects. Pt instructed to expect mild to moderate muscle soreness later in the day and/or into the next day.  Pt instructed in methods to reduce muscle soreness. Pt instructed to continue prescribed HEP. Because Dry Needling was performed over or adjacent to a lung field, pt was educated on S/S of pneumothorax and to seek immediate medical attention should they occur.  Patient was educated on  signs and symptoms of infection and other risk factors and advised to seek medical attention should they occur.  Patient verbalized understanding of these instructions and education.  Patient Verbal Consent Given: Yes Education Handout Provided: Yes Muscles Treated: right suboccipital, right  upper trap, right rhomboid, right delts Electrical Stimulation Performed: No Treatment Response/Outcome: Utilized skilled palpation to identify bony landmarks and trigger points.  Able to illicit twitch response and muscle elongation.  Soft tissue mobilization following to further promote tissue elongation.        PATIENT EDUCATION: Education details: Issued HEP Person educated: Patient Education method: Explanation, Demonstration, and Handouts Education comprehension: verbalized understanding and returned demonstration  HOME EXERCISE PROGRAM: Access Code: [email or web address] URL: [email or web address] Date: 07/22/2023 Prepared by: Chaneta Comer Carena [place]  Exercises - Standing Ulnar Nerve Glide  - 1 x daily - 7 x weekly - 2 sets - 10 reps - Seated Wrist Extension Stretch  - 1 x daily - 7 x weekly - 2 reps - 20 sec hold - Wrist Prayer Stretch  - 1 x daily - 7 x weekly - 2 reps - 20 sec hold - Seated Scapular Retraction  - 1 x daily - 7 x weekly - 2 sets - 10 reps - Standing shoulder flexion wall slides  - 1 x daily - 7 x weekly - 2 sets - 10 reps - Standing Shoulder Internal Rotation with Anchored Resistance  - 1 x daily - 3 x weekly - 2 sets - 5-10 reps - Shoulder External Rotation with Anchored Resistance  - 1 x daily - 3 x weekly - 2 sets - 5-10 reps - Sleeper Stretch  - 1 x daily - 3 x weekly - 2 reps - 15-20 sec hold - Standing Shoulder Horizontal Adduction with Anchored Resistance  - 1 x daily - 3 x weekly - 2 sets - 5-10 reps  ASSESSMENT:  CLINICAL IMPRESSION: Patient is a 33 y.o. female who was seen today for physical therapy evaluation and treatment for right elbow pain.  Patient is known to this clinic from a previous episode of care that was essentially, unsuccessful.  Patient was recommended to complete OT services to assess if progress could be made.  Additionally, patient has now seen a Pain Management MD.  Patient with reporting that OT did seem to help her dexterity, but she  is still having some pain, so she was referred back to PT to assess if dry needling could be helpful.  Patient works at a [employer] and has a repetitive job, which Dr Cleora Daft feels is likely worsening her condition.  Per patient, her right shoulder is more of her concern recently than her elbow pain.  Patient presents to skilled PT with muscle weakness, increased pain, muscle spasms, and difficulty performing functional tasks.  Patient with strong twitch response noted, especially with upper trap.  Soft tissue mobilization after treatment helped with further improved tissue movement.  Patient would benefit from skilled PT to address her functional deficits to allow her to learn strategies to decrease her pain with various tasks.   OBJECTIVE IMPAIRMENTS: decreased strength, impaired perceived functional ability, increased muscle spasms, impaired sensation, impaired UE functional use, postural dysfunction, and pain.   ACTIVITY LIMITATIONS: carrying, lifting, and sleeping  PARTICIPATION LIMITATIONS: cleaning, laundry, shopping, community activity, and occupation  PERSONAL FACTORS: Past/current experiences, Time since onset of injury/illness/exacerbation, and 1-2 comorbidities: Right wrist ganglion cyst removal in July 2024, right medial epicondylitis  are also affecting patient's functional outcome.   REHAB POTENTIAL: Good  CLINICAL DECISION MAKING: Evolving/moderate complexity  EVALUATION COMPLEXITY: Moderate  GOALS: Goals reviewed with patient? Yes  SHORT TERM GOALS: Target date: 08/14/2023  Pt will be independent with initial HEP. Baseline: Goal status: INITIAL  2.  Pt will report at least a 25% improvement in symptoms. Baseline:  Goal status: INITIAL   LONG TERM GOALS: Target date: 01/30/2023  Pt will be independent with advanced HEP. Baseline:  Goal status: INITIAL  2.  Pt will increase right grip strength to at least 30 pounds to allow her to perform job related  tasks. Baseline: 20 pounds Goal status: INITIAL  3.  Pt will increase right UE strength to at least 4+/5 to allow her to perform job related tasks with increased ease. Baseline:  Goal status: INITIAL  4.  Patient will report no increase in pain with lifting her cat and with drawing. Baseline:  Goal status: INITIAL   PLAN: PT FREQUENCY: 1-2x/week  PT DURATION: 8 weeks  PLANNED INTERVENTIONS: 97164- PT Re-evaluation, 97110-Therapeutic exercises, 97530- Therapeutic activity, 97112- Neuromuscular re-education, 97535- Self Care, 56213- Manual therapy, [phone]- Gait training, [phone]- Aquatic Therapy, [phone]- Electrical stimulation (unattended), [phone]- Electrical stimulation (manual), [id number]- Vasopneumatic device, [id number]- Ultrasound, [id number]- Traction (mechanical), [id number]- Ionotophoresis 4mg /ml Dexamethasone, Patient/Family education, Balance training, Taping, Dry Needling, Joint mobilization, Joint manipulation, Spinal manipulation, Spinal mobilization, Cryotherapy, Moist heat, Therapeutic exercises, Therapeutic activity, Neuromuscular re-education, and Self Care  PLAN FOR NEXT SESSION: Assess and progress HEP as indicated, dry needling/manual therapy as indicated, strengthening   Robyne Christen, PT, DPT 07/22/23, 12:32 PM  [facility] [address], [address] [place], [place] [address] Phone # [phone] Fax [phone]

## 2023-07-22 NOTE — Patient Instructions (Signed)

## 2023-08-04 ENCOUNTER — Ambulatory Visit

## 2023-08-11 ENCOUNTER — Encounter: Payer: Self-pay | Admitting: Rehabilitative and Restorative Service Providers"

## 2023-08-11 ENCOUNTER — Ambulatory Visit: Attending: Sports Medicine | Admitting: Rehabilitative and Restorative Service Providers"

## 2023-08-11 DIAGNOSIS — R208 Other disturbances of skin sensation: Secondary | ICD-10-CM | POA: Insufficient documentation

## 2023-08-11 DIAGNOSIS — M25511 Pain in right shoulder: Secondary | ICD-10-CM | POA: Diagnosis present

## 2023-08-11 DIAGNOSIS — M7701 Medial epicondylitis, right elbow: Secondary | ICD-10-CM | POA: Diagnosis present

## 2023-08-11 DIAGNOSIS — R252 Cramp and spasm: Secondary | ICD-10-CM | POA: Insufficient documentation

## 2023-08-11 DIAGNOSIS — M25521 Pain in right elbow: Secondary | ICD-10-CM | POA: Insufficient documentation

## 2023-08-11 DIAGNOSIS — M6281 Muscle weakness (generalized): Secondary | ICD-10-CM | POA: Insufficient documentation

## 2023-08-11 NOTE — Therapy (Signed)
 OUTPATIENT PHYSICAL THERAPY TREATMENT NOTE   Patient Name: Natalie Leblanc MRN: [id number] DOB:September 28, 1990, 33 y.o., female Today's Date: 08/11/2023  END OF SESSION:  PT End of Session - 08/11/23 1236     Visit Number 2    Date for PT Re-Evaluation 09/11/23    Authorization Type U[employer]    PT Start Time 1231    PT Stop Time 1311    PT Time Calculation (min) 40 min    Activity Tolerance Patient tolerated treatment well    Behavior During Therapy WFL for tasks assessed/performed              Past Medical History:  Diagnosis Date   COVID    Depression    PTSD (post-traumatic stress disorder)    Past Surgical History:  Procedure Laterality Date   BREAST LUMPECTOMY Left    benign per patient   Patient Active Problem List   Diagnosis Date Noted   Medial epicondylitis of elbow, right 06/17/2023   Bicipital tendonitis of shoulder, right 06/17/2023   Spasms of the hands or feet 06/17/2023   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 12/28/2019   Vitamin D  deficiency 12/23/2019   Depression    PTSD (post-traumatic stress disorder)     PCP: Zilphia Hilt, Charyl Coppersmith, MD  REFERRING PROVIDER: Ulysees Gander, DO  REFERRING DIAG: [phone] (ICD-10-CM) - Right elbow pain M77.01 (ICD-10-CM) - Medial epicondylitis of right elbow M25.519 (ICD-10-CM) - Arthralgia of shoulder, unspecified laterality  THERAPY DIAG:  Pain in right elbow  Muscle weakness  Other disturbances of skin sensation  Right shoulder pain, unspecified chronicity  Medial epicondylitis of elbow, right  Cramp and spasm  Rationale for Evaluation and Treatment: Rehabilitation  ONSET DATE: Last September (but had this injury in the past before)  SUBJECTIVE:                                                                                                                                                                                      SUBJECTIVE STATEMENT: Pt reports that her shoulder  was feeling better after dry needling.  States that she may have over done it after, and she had increased pain.  Patient states that she noticed that she has been having increased stress and that leads to increased pain and migraines.  Hand dominance: Right  PERTINENT HISTORY: S/P Right Ganglion Cyst removal on 10/16/2022, PTSD, History of right knee pain (possible right knee medial meniscal tear vs medial plical type syndrome following Dr Hermina Loosen)  PAIN:  Are you having pain? Yes: NPRS scale: 4/10 Pain location: right medial elbow and right shoulder Pain description: sharp Aggravating factors: movement, pushing, reaching  Relieving factors: medication, PT helped in the past  PRECAUTIONS: Other: Per documentation by Dr Cleora Daft, no official restrictions, however, he does feel that repetitive motions and heavy lifting do contribute to increased pain  RED FLAGS: None   WEIGHT BEARING RESTRICTIONS: No  FALLS:  Has patient fallen in last 6 months? 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 No specific internal derangement is identified to explain the patient's symptoms.  PATIENT SURVEYS :  Eval:  Quick Dash 40.91  COGNITION: Overall cognitive status: Within functional limits for tasks assessed     SENSATION: Patient denies  POSTURE: Rounded shoulders, forward flexed  UPPER EXTREMITY ROM:   Eval:  Pt has ROM WFL, however, has pain with right elbow  flexion/extension and supination/pronation  UPPER EXTREMITY MMT:  Eval:   Right shoulder and elbow strength of 4/5 Left shoulder/UE strength is 4+ to 5/5 grossly throughout Right grip strength of 28 lbs Left grip strength of 32 lbs Right key pinch strength is 10 pounds Left key pinch strength is 15 pounds  SHOULDER SPECIAL TESTS: Impingement tests: Hawkins/Kennedy impingement test: negative  Rotator cuff assessment: Empty can test: negative    PALPATION:  Tender to palpation around right shoulder region and biceps tendon insertion. Tenderness with muscle spasms noted in right medial elbow region   TODAY'S TREATMENT:                                                                                                                                          DATE: 08/11/2023 Nustep level 4 x6 min with PT present to discuss status Seated shoulder ER with red tband 2x8 Seated shoulder horizontal abduction with red tband 2x8 Standing shoulder rows with red tband 2x10 Standing shoulder extension with red tband 2x8 Standing 4D scapular stabilization with small green spiky ball x10 bilat Standing lower trap 2x10 Prone:  shoulder flexion, horizontal abduction, extension, and rows.  X10 each bilat Trigger Point Dry Needling Subsequent Treatment: Instructions provided previously at initial dry needling treatment.  Patient Verbal Consent Given: Yes Education Handout Provided: Previously Provided Muscles Treated: right upper trap, right rhomboid, right delts Electrical Stimulation Performed: No Treatment Response/Outcome: Utilized skilled palpation to identify bony landmarks and trigger points.  Able to illicit twitch response and muscle elongation.  Soft tissue mobilization following to further promote tissue elongation.     DATE: 07/22/2023 Issued HEP (  see below) Trigger Point Dry Needling Initial Treatment: Pt instructed on Dry Needling rational, procedures, and possible side effects. Pt  instructed to expect mild to moderate muscle soreness later in the day and/or into the next day.  Pt instructed in methods to reduce muscle soreness. Pt instructed to continue prescribed HEP. Because Dry Needling was performed over or adjacent to a lung field, pt was educated on S/S of pneumothorax and to seek immediate medical attention should they occur.  Patient was educated on signs and symptoms of infection and other risk factors and advised to seek medical attention should they occur.  Patient verbalized understanding of these instructions and education.  Patient Verbal Consent Given: Yes Education Handout Provided: Yes Muscles Treated: right suboccipital, right upper trap, right rhomboid, right delts Electrical Stimulation Performed: No Treatment Response/Outcome: Utilized skilled palpation to identify bony landmarks and trigger points.  Able to illicit twitch response and muscle elongation.  Soft tissue mobilization following to further promote tissue elongation.        PATIENT EDUCATION: Education details: Issued HEP Person educated: Patient Education method: Explanation, Demonstration, and Handouts Education comprehension: verbalized understanding and returned demonstration  HOME EXERCISE PROGRAM: Access Code: [email or web address] URL: [email or web address] Date: 07/22/2023 Prepared by: Chaneta Comer Milika [place]  Exercises - Standing Ulnar Nerve Glide  - 1 x daily - 7 x weekly - 2 sets - 10 reps - Seated Wrist Extension Stretch  - 1 x daily - 7 x weekly - 2 reps - 20 sec hold - Wrist Prayer Stretch  - 1 x daily - 7 x weekly - 2 reps - 20 sec hold - Seated Scapular Retraction  - 1 x daily - 7 x weekly - 2 sets - 10 reps - Standing shoulder flexion wall slides  - 1 x daily - 7 x weekly - 2 sets - 10 reps - Standing Shoulder Internal Rotation with Anchored Resistance  - 1 x daily - 3 x weekly - 2 sets - 5-10 reps - Shoulder External Rotation with Anchored Resistance  - 1 x daily - 3 x  weekly - 2 sets - 5-10 reps - Sleeper Stretch  - 1 x daily - 3 x weekly - 2 reps - 15-20 sec hold - Standing Shoulder Horizontal Adduction with Anchored Resistance  - 1 x daily - 3 x weekly - 2 sets - 5-10 reps  ASSESSMENT:  CLINICAL IMPRESSION: Twylla presents to skilled PT with continued pain, but states that she attributes some of it to increased stress.  Patient did report that her shoulder pain felt significantly better after dry needling last session.  She states that she is returning to Dr Hermina Loosen this week to follow up with her knee pain.  Patient requires some cuing during session to relax upper traps to decrease overuse.  Patient able to perform shoulder and scapular strengthening and stabilization during session.  Patient continues to have strong twitch response noted during dry needling of right upper body muscles.  Patient with further tissue elongation noted with dry needling at end of session.  Patient continues to require skilled PT to progress towards goal related activities.   OBJECTIVE IMPAIRMENTS: decreased strength, impaired perceived functional ability, increased muscle spasms, impaired sensation, impaired UE functional use, postural dysfunction, and pain.   ACTIVITY LIMITATIONS: carrying, lifting, and sleeping  PARTICIPATION LIMITATIONS: cleaning, laundry, shopping, community activity, and occupation  PERSONAL FACTORS: Past/current experiences, Time since onset of injury/illness/exacerbation, and 1-2 comorbidities: Right wrist ganglion cyst removal in July 2024, right medial epicondylitis  are also affecting patient's functional outcome.   REHAB POTENTIAL: Good  CLINICAL DECISION MAKING: Evolving/moderate complexity  EVALUATION COMPLEXITY: Moderate  GOALS: Goals reviewed with patient? Yes  SHORT TERM GOALS: Target date: 08/14/2023  Pt will be independent with initial HEP. Baseline: Goal status: MET on 08/11/2023  2.  Pt will report at least a 25% improvement in  symptoms. Baseline:  Goal status: IN PROGRESS   LONG TERM GOALS: Target date: 01/30/2023  Pt will be independent with advanced HEP. Baseline:  Goal status: INITIAL  2.  Pt will increase right grip strength to at least 30 pounds to allow her to perform job related tasks. Baseline: 20 pounds Goal status: INITIAL  3.  Pt will increase right UE strength to at least 4+/5 to allow her to perform job related tasks with increased ease. Baseline:  Goal status: INITIAL  4.  Patient will report no increase in pain with lifting her cat and with drawing. Baseline:  Goal status: INITIAL   PLAN: PT FREQUENCY: 1-2x/week  PT DURATION: 8 weeks  PLANNED INTERVENTIONS: 97164- PT Re-evaluation, 97110-Therapeutic exercises, 97530- Therapeutic activity, 97112- Neuromuscular re-education, 97535- Self Care, 40981- Manual therapy, [phone]- Gait training, [phone]- Aquatic Therapy, [phone]- Electrical stimulation (unattended), [phone]- Electrical stimulation (manual), 97016- Vasopneumatic device, [id number]- Ultrasound, [id number]- Traction (mechanical), [id number]- Ionotophoresis 4mg /ml Dexamethasone, Patient/Family education, Balance training, Taping, Dry Needling, Joint mobilization, Joint manipulation, Spinal manipulation, Spinal mobilization, Cryotherapy, Moist heat, Therapeutic exercises, Therapeutic activity, Neuromuscular re-education, and Self Care  PLAN FOR NEXT SESSION: Assess and progress HEP as indicated, dry needling/manual therapy as indicated, strengthening   Robyne Christen, PT, DPT 08/11/23, 1:17 PM  [facility] [address], [address] [place], [place] [address] Phone # [phone] Fax [phone]

## 2023-08-14 ENCOUNTER — Ambulatory Visit (HOSPITAL_BASED_OUTPATIENT_CLINIC_OR_DEPARTMENT_OTHER): Admitting: Orthopaedic Surgery

## 2023-08-14 DIAGNOSIS — M25561 Pain in right knee: Secondary | ICD-10-CM

## 2023-08-14 DIAGNOSIS — G8929 Other chronic pain: Secondary | ICD-10-CM | POA: Diagnosis not present

## 2023-08-14 NOTE — Progress Notes (Signed)
 Chief Complaint: Right knee pain     History of Present Illness:   08/14/2023: Natalie Leblanc today with persistent right knee pain as well as left hip pain  Natalie Leblanc is a 33 y.o. female presents today with ongoing persistent right knee pain for the last several months.  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  She has been experiencing medial based knee pain and making it very difficult to get through an entire day.  There is popping and clicking.  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    PMH/PSH/Family History/Social History/Meds/Allergies:    Past Medical History:  Diagnosis Date   COVID    Depression    PTSD (post-traumatic stress disorder)    Past Surgical History:  Procedure Laterality Date   BREAST LUMPECTOMY Left    benign per patient   Social History   Socioeconomic History   Marital status: Single    Spouse name: Not on file   Number of children: Not on file   Years of education: Not on file   Highest education level: 12th grade  Occupational History   Not on file  Tobacco Use   Smoking status: Never   Smokeless tobacco: Never  Vaping Use   Vaping status: Never Used  Substance and Sexual Activity   Alcohol use: Yes    Comment: occasional   Drug use: No   Sexual activity: Yes  Other Topics Concern   Not on file  Social History Narrative   Not on file   Social Drivers of Health   Financial Resource Strain: Low Risk  (07/30/2021)   Overall Financial Resource Strain (CARDIA)    Difficulty of Paying Living Expenses: Not very hard  Food Insecurity: No Food Insecurity (07/30/2021)   Hunger Vital Sign    Worried About Running Out of Food in the Last Year: Never true    Ran Out of Food in the Last Year: Never true  Transportation Needs: No Transportation Needs (07/30/2021)   PRAPARE - Therapist, art (Medical): No    Lack of Transportation (Non-Medical): No  Physical Activity: Unknown (07/30/2021)   Exercise Vital Sign    Days of Exercise per Week: Patient declined    Minutes of Exercise per Session: Not on file  Stress: Stress Concern Present (07/30/2021)   [employer] of Occupational Health - Occupational Stress Questionnaire    Feeling of Stress : Very much  Social Connections: Unknown (08/05/2021)   [facility]   Social Network    Social Network: Not on file  Recent Concern: Social Connections - Socially Isolated (07/30/2021)   Social Connection and Isolation Panel [NHANES]    Frequency of Communication with Friends and Family: Twice a week    Frequency of Social Gatherings with Friends and Family: Once a week    Attends Religious Services: Never    Database administrator or Organizations: No    Attends Engineer, structural: Not on file    Marital Status: Never married   Family History  Problem Relation Age of Onset   Gestational diabetes Mother    Alcohol abuse Mother    Mental illness Mother    Eczema Sister    Depression Sister    Psychosis Maternal Aunt  Thyroid disease Sister    Depression Sister    Heart murmur Sister    Allergies  Allergen Reactions   Fish Allergy     GI upset   Mobic  [Meloxicam ]    Other Itching    Animal Dander   Current Outpatient Medications  Medication Sig Dispense Refill   hydrOXYzine (ATARAX) 25 MG tablet Take 25 mg by mouth at bedtime as needed.     Multiple Vitamin (MULTIVITAMIN) capsule Take 1 capsule by mouth daily.     rizatriptan  (MAXALT ) 10 MG tablet Take 1 tablet (10 mg total) by mouth as needed for migraine. May repeat in 2 hours if needed 10 tablet 6   sertraline (ZOLOFT) 100 MG tablet Take 100 mg by mouth daily.     No current facility-administered medications for this visit.   No results found.  Review of Systems:   A ROS was performed including  pertinent positives and negatives as documented in the HPI.  Physical Exam :   Constitutional: NAD and appears stated age Neurological: Alert and oriented Psych: Appropriate affect and cooperative There were no vitals taken for this visit.   Comprehensive Musculoskeletal Exam:    Right knee with tenderness about the posterior hamstring medially, positive McMurray.  Mild effusion.  Range of motion is -3 to 235 degrees.  Distal neurosensory exam is intact   Imaging:   Xray (4 views right knee): Normal    I personally reviewed and interpreted the radiographs.   Assessment and Plan:   33 y.o. female with evidence of possible right knee with an MRI which is well-appearing and symptoms consistent more with medial hamstring issues.  I would like her to work with physical therapy for dry needling of the hamstring as well as the left hip and lower back.  I will plan to see her back in 3 months for follow-up      I personally saw and evaluated the patient, and participated in the management and treatment plan.  Natalie Harada, MD Attending Physician, Orthopedic Surgery  This document was dictated using Dragon voice recognition software. A reasonable attempt at proof reading has been made to minimize errors.

## 2023-08-17 ENCOUNTER — Other Ambulatory Visit: Payer: Self-pay | Admitting: *Deleted

## 2023-08-19 ENCOUNTER — Encounter: Payer: Self-pay | Admitting: Rehabilitative and Restorative Service Providers"

## 2023-08-19 ENCOUNTER — Ambulatory Visit: Attending: Sports Medicine | Admitting: Rehabilitative and Restorative Service Providers"

## 2023-08-19 DIAGNOSIS — M7701 Medial epicondylitis, right elbow: Secondary | ICD-10-CM | POA: Insufficient documentation

## 2023-08-19 DIAGNOSIS — M25521 Pain in right elbow: Secondary | ICD-10-CM | POA: Diagnosis present

## 2023-08-19 DIAGNOSIS — M6281 Muscle weakness (generalized): Secondary | ICD-10-CM | POA: Insufficient documentation

## 2023-08-19 DIAGNOSIS — R208 Other disturbances of skin sensation: Secondary | ICD-10-CM | POA: Insufficient documentation

## 2023-08-19 DIAGNOSIS — R252 Cramp and spasm: Secondary | ICD-10-CM | POA: Insufficient documentation

## 2023-08-19 DIAGNOSIS — M25511 Pain in right shoulder: Secondary | ICD-10-CM | POA: Insufficient documentation

## 2023-08-19 NOTE — Therapy (Signed)
 OUTPATIENT PHYSICAL THERAPY TREATMENT NOTE   Patient Name: Natalie Leblanc MRN: [id number] DOB:July 23, 1990, 33 y.o., female Today's Date: 08/19/2023  END OF SESSION:  PT End of Session - 08/19/23 1241     Visit Number 3    Date for PT Re-Evaluation 09/11/23    Authorization Type [employer]    PT Start Time 1237    PT Stop Time 1318    PT Time Calculation (min) 41 min    Activity Tolerance Patient tolerated treatment well    Behavior During Therapy WFL for tasks assessed/performed              Past Medical History:  Diagnosis Date   COVID    Depression    PTSD (post-traumatic stress disorder)    Past Surgical History:  Procedure Laterality Date   BREAST LUMPECTOMY Left    benign per patient   Patient Active Problem List   Diagnosis Date Noted   Medial epicondylitis of elbow, right 06/17/2023   Bicipital tendonitis of shoulder, right 06/17/2023   Spasms of the hands or feet 06/17/2023   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 12/28/2019   Vitamin D  deficiency 12/23/2019   Depression    PTSD (post-traumatic stress disorder)     PCP: Zilphia Hilt, Charyl Coppersmith, MD  REFERRING PROVIDER: Ulysees Gander, DO  REFERRING DIAG: [phone] (ICD-10-CM) - Right elbow pain M77.01 (ICD-10-CM) - Medial epicondylitis of right elbow M25.519 (ICD-10-CM) - Arthralgia of shoulder, unspecified laterality  THERAPY DIAG:  Pain in right elbow  Muscle weakness  Other disturbances of skin sensation  Right shoulder pain, unspecified chronicity  Medial epicondylitis of elbow, right  Cramp and spasm  Rationale for Evaluation and Treatment: Rehabilitation  ONSET DATE: Last September (but had this injury in the past before)  SUBJECTIVE:                                                                                                                                                                                      SUBJECTIVE STATEMENT: Pt reports that her shoulder  is feeling 50% better since starting PT.  She states that she did not sleep well last night.  Hand dominance: Right  PERTINENT HISTORY: S/P Right Ganglion Cyst removal on 10/16/2022, PTSD, History of right knee pain (possible right knee medial meniscal tear vs medial plical type syndrome following Dr Hermina Loosen)  PAIN:  Are you having pain? Yes: NPRS scale: currently 2-3/10 Pain location: right medial elbow and right shoulder Pain description: sharp Aggravating factors: movement, pushing, reaching Relieving factors: medication, PT helped in the past  PRECAUTIONS: Other: Per documentation by Dr Cleora Daft, no official restrictions, however, he does feel  that repetitive motions and heavy lifting do contribute to increased pain  RED FLAGS: None   WEIGHT BEARING RESTRICTIONS: No  FALLS:  Has patient fallen in last 6 months? 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 No specific internal derangement is identified to explain the patient's symptoms.  PATIENT SURVEYS :  Eval:  Quick Dash 40.91  COGNITION: Overall cognitive status: Within functional limits for tasks assessed     SENSATION: Patient denies  POSTURE: Rounded shoulders, forward flexed  UPPER EXTREMITY ROM:   Eval:  Pt has ROM WFL, however, has pain with right elbow flexion/extension and supination/pronation  UPPER EXTREMITY MMT:  Eval:   Right shoulder and elbow strength of 4/5 Left shoulder/UE  strength is 4+ to 5/5 grossly throughout Right grip strength of 28 lbs Left grip strength of 32 lbs Right key pinch strength is 10 pounds Left key pinch strength is 15 pounds  08/19/2023: Right grip strength of 28 lbs Left grip strength of 25 lbs   SHOULDER SPECIAL TESTS: Impingement tests: Hawkins/Kennedy impingement test: negative  Rotator cuff assessment: Empty can test: negative    PALPATION:  Tender to palpation around right shoulder region and biceps tendon insertion. Tenderness with muscle spasms noted in right medial elbow region   TODAY'S TREATMENT:                                                                                                                                          DATE: 08/19/2023 Nustep level 5 x6 min with PT present to discuss status Standing shoulder ER with red tband 2x8 Standing shoulder horizontal abduction with red tband 2x8 Standing 4D scapular stabilization with small green spiky ball x10 bilat Standing 3 way shoulder elevation with 1# dumbbell x10 bilat Standing lower trap 2x10 Standing shoulder rows with red tband 2x10 Standing shoulder extension with red tband 2x8 Quadruped alt UE/LE extension 2x5 Prone alt UE/LE extension x10 Trigger Point Dry Needling Subsequent Treatment: Instructions provided previously at initial dry needling treatment.  Patient Verbal Consent Given: Yes Education Handout Provided: Previously Provided Muscles Treated: bilat upper trap, bilat rhomboids Electrical Stimulation Performed: No Treatment Response/Outcome: Utilized skilled palpation to identify bony landmarks and trigger points.  Able to illicit twitch response and muscle elongation.  Soft tissue mobilization following to further promote tissue elongation.     DATE: 08/11/2023 Nustep level 4 x6 min  with PT present to discuss status Seated shoulder ER with red tband 2x8 Seated shoulder horizontal abduction with red tband 2x8 Standing shoulder rows with red  tband 2x10 Standing shoulder extension with red tband 2x8 Standing 4D scapular stabilization with small green spiky ball x10 bilat Standing lower trap 2x10 Prone:  shoulder flexion, horizontal abduction, extension, and rows.  X10 each bilat Trigger Point Dry Needling Subsequent Treatment: Instructions provided previously at initial dry needling treatment.  Patient Verbal Consent Given: Yes Education Handout Provided: Previously Provided Muscles Treated: right upper trap, right rhomboid, right delts Electrical Stimulation Performed: No Treatment Response/Outcome: Utilized skilled palpation to identify bony landmarks and trigger points.  Able to illicit twitch response and muscle elongation.  Soft tissue mobilization following to further promote tissue elongation.     DATE: 07/22/2023 Issued HEP (see below) Trigger Point Dry Needling Initial Treatment: Pt instructed on Dry Needling rational, procedures, and possible side effects. Pt instructed to expect mild to moderate muscle soreness later in the day and/or into the next day.  Pt instructed in methods to reduce muscle soreness. Pt instructed to continue prescribed HEP. Because Dry Needling was performed over or adjacent to a lung field, pt was educated on S/S of pneumothorax and to seek immediate medical attention should they occur.  Patient was educated on signs and symptoms of infection and other risk factors and advised to seek medical attention should they occur.  Patient verbalized understanding of these instructions and education.  Patient Verbal Consent Given: Yes Education Handout Provided: Yes Muscles Treated: right suboccipital, right upper trap, right rhomboid, right delts Electrical Stimulation Performed: No Treatment Response/Outcome: Utilized skilled palpation to identify bony landmarks and trigger points.  Able to illicit twitch response and muscle elongation.  Soft tissue mobilization following to further promote tissue  elongation.        PATIENT EDUCATION: Education details: Issued HEP Person educated: Patient Education method: Explanation, Demonstration, and Handouts Education comprehension: verbalized understanding and returned demonstration  HOME EXERCISE PROGRAM: Access Code: [email or web address] URL: [email or web address] Date: 08/19/2023 Prepared by: Chaneta Comer Joakim Huesman  Exercises - Standing Ulnar Nerve Glide  - 1 x daily - 7 x weekly - 2 sets - 10 reps - Seated Wrist Extension Stretch  - 1 x daily - 7 x weekly - 2 reps - 20 sec hold - Wrist Prayer Stretch  - 1 x daily - 7 x weekly - 2 reps - 20 sec hold - Seated Scapular Retraction  - 1 x daily - 7 x weekly - 2 sets - 10 reps - Standing shoulder flexion wall slides  - 1 x daily - 7 x weekly - 2 sets - 10 reps - Standing Shoulder Internal Rotation with Anchored Resistance  - 1 x daily - 3 x weekly - 2 sets - 5-10 reps - Shoulder External Rotation with Anchored Resistance  - 1 x daily - 3 x weekly - 2 sets - 5-10 reps - Sleeper Stretch  - 1 x daily - 3 x weekly - 2 reps - 15-20 sec hold - Standing Shoulder Horizontal Adduction with Anchored Resistance  - 1 x daily - 3 x weekly - 2 sets - 5-10 reps - Bird Dog  - 1 x daily - 7 x weekly - 2 sets - 5-10 reps - Prone Alternating Arm and Leg Lifts  - 1 x daily - 7 x weekly - 2 sets - 10 reps - Bird Dog on Counter  - 1 x daily - 7 x weekly - 2  sets - 10 reps  ASSESSMENT:  CLINICAL IMPRESSION: Milayah presents to skilled PT with a new order from a different provider (Dr Hermina Loosen) for her knee and she is also going to a podiatrist for possible inserts.  Recommended to patient to initiate that evaluation for her knee after she has her inserts to assess how she will tolerate.  Patient with similar grip strength as noted at evaluation.  Patient continues to progress with shoulder and scapular strengthening during session.  Added in quadruped position for improved joint approximation, but also provided bird  dog on counter to allow patient to perform at work.  Patient continues to progress towards goal related activities.   OBJECTIVE IMPAIRMENTS: decreased strength, impaired perceived functional ability, increased muscle spasms, impaired sensation, impaired UE functional use, postural dysfunction, and pain.   ACTIVITY LIMITATIONS: carrying, lifting, and sleeping  PARTICIPATION LIMITATIONS: cleaning, laundry, shopping, community activity, and occupation  PERSONAL FACTORS: Past/current experiences, Time since onset of injury/illness/exacerbation, and 1-2 comorbidities: Right wrist ganglion cyst removal in July 2024, right medial epicondylitis are also affecting patient's functional outcome.   REHAB POTENTIAL: Good  CLINICAL DECISION MAKING: Evolving/moderate complexity  EVALUATION COMPLEXITY: Moderate  GOALS: Goals reviewed with patient? Yes  SHORT TERM GOALS: Target date: 08/14/2023  Pt will be independent with initial HEP. Baseline: Goal status: MET on 08/11/2023  2.  Pt will report at least a 25% improvement in symptoms. Baseline:  Goal status: MET on 08/19/23   LONG TERM GOALS: Target date: 01/30/2023  Pt will be independent with advanced HEP. Baseline:  Goal status: IN PROGRESS  2.  Pt will increase right grip strength to at least 30 pounds to allow her to perform job related tasks. Baseline: 20 pounds Goal status: IN PROGRESS (see above)  3.  Pt will increase right UE strength to at least 4+/5 to allow her to perform job related tasks with increased ease. Baseline:  Goal status: IN PROGRESS  4.  Patient will report no increase in pain with lifting her cat and with drawing. Baseline:  Goal status: IN PROGRESS   PLAN: PT FREQUENCY: 1-2x/week  PT DURATION: 8 weeks  PLANNED INTERVENTIONS: 97164- PT Re-evaluation, 97110-Therapeutic exercises, 97530- Therapeutic activity, 97112- Neuromuscular re-education, 97535- Self Care, 95621- Manual therapy, [phone]- Gait training,  [phone]- Aquatic Therapy, [phone]- Electrical stimulation (unattended), [phone]- Electrical stimulation (manual), 97016- Vasopneumatic device, [id number]- Ultrasound, [id number]- Traction (mechanical), [id number]- Ionotophoresis 4mg /ml Dexamethasone, Patient/Family education, Balance training, Taping, Dry Needling, Joint mobilization, Joint manipulation, Spinal manipulation, Spinal mobilization, Cryotherapy, Moist heat, Therapeutic exercises, Therapeutic activity, Neuromuscular re-education, and Self Care  PLAN FOR NEXT SESSION: Assess and progress HEP as indicated, dry needling/manual therapy as indicated, strengthening   Robyne Christen, PT, DPT 08/19/23, 1:33 PM  [facility] [address], [address] [place], [place] [address] Phone # [phone] Fax [phone]

## 2023-08-26 ENCOUNTER — Encounter: Payer: Self-pay | Admitting: Rehabilitative and Restorative Service Providers"

## 2023-08-26 ENCOUNTER — Ambulatory Visit: Admitting: Rehabilitative and Restorative Service Providers"

## 2023-08-26 DIAGNOSIS — R252 Cramp and spasm: Secondary | ICD-10-CM

## 2023-08-26 DIAGNOSIS — M25521 Pain in right elbow: Secondary | ICD-10-CM

## 2023-08-26 DIAGNOSIS — M25511 Pain in right shoulder: Secondary | ICD-10-CM

## 2023-08-26 DIAGNOSIS — M6281 Muscle weakness (generalized): Secondary | ICD-10-CM

## 2023-08-26 DIAGNOSIS — M7701 Medial epicondylitis, right elbow: Secondary | ICD-10-CM

## 2023-08-26 DIAGNOSIS — R208 Other disturbances of skin sensation: Secondary | ICD-10-CM

## 2023-08-26 NOTE — Therapy (Signed)
 OUTPATIENT PHYSICAL THERAPY TREATMENT NOTE   Patient Name: Natalie Leblanc MRN: [id number] DOB:06/16/1990, 33 y.o., female Today's Date: 08/26/2023  END OF SESSION:  PT End of Session - 08/26/23 1236     Visit Number 4    Date for PT Re-Evaluation 09/11/23    Authorization Type [employer]    PT Start Time 1232    PT Stop Time 1312    PT Time Calculation (min) 40 min    Activity Tolerance Patient tolerated treatment well    Behavior During Therapy WFL for tasks assessed/performed              Past Medical History:  Diagnosis Date   COVID    Depression    PTSD (post-traumatic stress disorder)    Past Surgical History:  Procedure Laterality Date   BREAST LUMPECTOMY Left    benign per patient   Patient Active Problem List   Diagnosis Date Noted   Medial epicondylitis of elbow, right 06/17/2023   Bicipital tendonitis of shoulder, right 06/17/2023   Spasms of the hands or feet 06/17/2023   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 12/28/2019   Vitamin D  deficiency 12/23/2019   Depression    PTSD (post-traumatic stress disorder)     PCP: Zilphia Hilt, Charyl Coppersmith, MD  REFERRING PROVIDER: Ulysees Gander, DO  REFERRING DIAG: [phone] (ICD-10-CM) - Right elbow pain M77.01 (ICD-10-CM) - Medial epicondylitis of right elbow M25.519 (ICD-10-CM) - Arthralgia of shoulder, unspecified laterality  THERAPY DIAG:  Pain in right elbow  Muscle weakness  Other disturbances of skin sensation  Right shoulder pain, unspecified chronicity  Medial epicondylitis of elbow, right  Cramp and spasm  Rationale for Evaluation and Treatment: Rehabilitation  ONSET DATE: Last September (but had this injury in the past before)  SUBJECTIVE:                                                                                                                                                                                      SUBJECTIVE STATEMENT: Pt states "I haven't done  anything and I just got a massage, so I'm feeling pretty good."  Hand dominance: Right  PERTINENT HISTORY: S/P Right Ganglion Cyst removal on 10/16/2022, PTSD, History of right knee pain (possible right knee medial meniscal tear vs medial plical type syndrome following Dr Hermina Loosen)  PAIN:  Are you having pain? Yes: NPRS scale: 1-2/10 Pain location: right medial elbow and right shoulder Pain description: sharp Aggravating factors: movement, pushing, reaching Relieving factors: medication, PT helped in the past  PRECAUTIONS: Other: Per documentation by Dr Cleora Daft, no official restrictions, however, he does feel that repetitive motions and heavy lifting do  contribute to increased pain  RED FLAGS: None   WEIGHT BEARING RESTRICTIONS: No  FALLS:  Has patient fallen in last 6 months? 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 No specific internal derangement is identified to explain the patient's symptoms.  PATIENT SURVEYS :  Eval:  Quick Dash 40.91  COGNITION: Overall cognitive status: Within functional limits for tasks assessed     SENSATION: Patient denies  POSTURE: Rounded shoulders, forward flexed  UPPER EXTREMITY ROM:   Eval:  Pt has ROM WFL, however, has pain with right elbow flexion/extension and supination/pronation  UPPER EXTREMITY MMT:  Eval:   Right shoulder and elbow strength of 4/5 Left shoulder/UE strength is 4+ to 5/5 grossly  throughout Right grip strength of 28 lbs Left grip strength of 32 lbs Right key pinch strength is 10 pounds Left key pinch strength is 15 pounds  08/19/2023: Right grip strength of 28 lbs Left grip strength of 25 lbs   SHOULDER SPECIAL TESTS: Impingement tests: Hawkins/Kennedy impingement test: negative  Rotator cuff assessment: Empty can test: negative    PALPATION:  Tender to palpation around right shoulder region and biceps tendon insertion. Tenderness with muscle spasms noted in right medial elbow region   TODAY'S TREATMENT:                                                                                                                                          DATE: 08/26/2023 Nustep level 5 x6 min with PT present to discuss status Standing shoulder ER with red tband 2x8 Standing shoulder horizontal abduction with red tband 2x8 Standing 3 way shoulder elevation with 1# dumbbell x10 bilat Standing lower trap lift off 2x10 Standing 4D scapular stabilization with small green spiky ball x20 bilat Standing shoulder rows with green tband 2x10 Standing shoulder extension with green tband 2x10 Quadruped alt UE/LE extension x10 Prone alt UE/LE extension x10 Supine 90/90 active hamstring stretch x5 bilat Trigger Point Dry Needling Subsequent Treatment: Instructions provided previously at initial dry needling treatment.  Patient Verbal Consent Given: Yes Education Handout Provided: Previously Provided Muscles Treated: bilat upper trap, bilat rhomboids, bilat suboccipitals Electrical Stimulation Performed: No Treatment Response/Outcome: Utilized skilled palpation to identify bony landmarks and trigger points.  Able to illicit twitch response and muscle elongation.  Soft tissue mobilization following to further promote tissue elongation.     DATE: 08/19/2023 Nustep  level 5 x6 min with PT present to discuss status Standing shoulder ER with red tband 2x8 Standing shoulder horizontal  abduction with red tband 2x8 Standing 4D scapular stabilization with small green spiky ball x10 bilat Standing 3 way shoulder elevation with 1# dumbbell x10 bilat Standing lower trap 2x10 Standing shoulder rows with red tband 2x10 Standing shoulder extension with red tband 2x8 Quadruped alt UE/LE extension 2x5 Prone alt UE/LE extension x10 Trigger Point Dry Needling Subsequent Treatment: Instructions provided previously at initial dry needling treatment.  Patient Verbal Consent Given: Yes Education Handout Provided: Previously Provided Muscles Treated: bilat upper trap, bilat rhomboids Electrical Stimulation Performed: No Treatment Response/Outcome: Utilized skilled palpation to identify bony landmarks and trigger points.  Able to illicit twitch response and muscle elongation.  Soft tissue mobilization following to further promote tissue elongation.     DATE: 08/11/2023 Nustep level 4 x6 min with PT present to discuss status Seated shoulder ER with red tband 2x8 Seated shoulder horizontal abduction with red tband 2x8 Standing shoulder rows with red tband 2x10 Standing shoulder extension with red tband 2x8 Standing 4D scapular stabilization with small green spiky ball x10 bilat Standing lower trap 2x10 Prone:  shoulder flexion, horizontal abduction, extension, and rows.  X10 each bilat Trigger Point Dry Needling Subsequent Treatment: Instructions provided previously at initial dry needling treatment.  Patient Verbal Consent Given: Yes Education Handout Provided: Previously Provided Muscles Treated: right upper trap, right rhomboid, right delts Electrical Stimulation Performed: No Treatment Response/Outcome: Utilized skilled palpation to identify bony landmarks and trigger points.  Able to illicit twitch response and muscle elongation.  Soft tissue mobilization following to further promote tissue elongation.     PATIENT EDUCATION: Education details: Issued HEP Person educated:  Patient Education method: Explanation, Demonstration, and Handouts Education comprehension: verbalized understanding and returned demonstration  HOME EXERCISE PROGRAM: Access Code: [email or web address] URL: [email or web address] Date: 08/19/2023 Prepared by: Chaneta Comer Deaven Barron  Exercises - Standing Ulnar Nerve Glide  - 1 x daily - 7 x weekly - 2 sets - 10 reps - Seated Wrist Extension Stretch  - 1 x daily - 7 x weekly - 2 reps - 20 sec hold - Wrist Prayer Stretch  - 1 x daily - 7 x weekly - 2 reps - 20 sec hold - Seated Scapular Retraction  - 1 x daily - 7 x weekly - 2 sets - 10 reps - Standing shoulder flexion wall slides  - 1 x daily - 7 x weekly - 2 sets - 10 reps - Standing Shoulder Internal Rotation with Anchored Resistance  - 1 x daily - 3 x weekly - 2 sets - 5-10 reps - Shoulder External Rotation with Anchored Resistance  - 1 x daily - 3 x weekly - 2 sets - 5-10 reps - Sleeper Stretch  - 1 x daily - 3 x weekly - 2 reps - 15-20 sec hold - Standing Shoulder Horizontal Adduction with Anchored Resistance  - 1 x daily - 3 x weekly - 2 sets - 5-10 reps - Bird Dog  - 1 x daily - 7 x weekly - 2 sets - 5-10 reps - Prone Alternating Arm and Leg Lifts  - 1 x daily - 7 x weekly - 2 sets - 10 reps - Bird Dog on Counter  - 1 x daily - 7 x weekly - 2 sets - 10 reps  ASSESSMENT:  CLINICAL IMPRESSION: Rileyann presents to skilled PT reporting that she goes to podiatrist next week and is hoping to get  inserts that will help her medial knee pain.  Will perform evaluation for right knee when patient is ready to DC from shoulder pain.  Patient presents with overall less pain today, but did just come from a massage therapist prior to arrival.  Patient able to progress to green theraband for exercises during session today.  Patient with improved balance/core stability on quadruped extension exercise today.  Patient continues to have strong twitch response noted during dry needling.  Patient continues to have  increased muscle tension noted on left side.  Patient continues to require skilled PT to progress towards goal related activities.   OBJECTIVE IMPAIRMENTS: decreased strength, impaired perceived functional ability, increased muscle spasms, impaired sensation, impaired UE functional use, postural dysfunction, and pain.   ACTIVITY LIMITATIONS: carrying, lifting, and sleeping  PARTICIPATION LIMITATIONS: cleaning, laundry, shopping, community activity, and occupation  PERSONAL FACTORS: Past/current experiences, Time since onset of injury/illness/exacerbation, and 1-2 comorbidities: Right wrist ganglion cyst removal in July 2024, right medial epicondylitis are also affecting patient's functional outcome.   REHAB POTENTIAL: Good  CLINICAL DECISION MAKING: Evolving/moderate complexity  EVALUATION COMPLEXITY: Moderate  GOALS: Goals reviewed with patient? Yes  SHORT TERM GOALS: Target date: 08/14/2023  Pt will be independent with initial HEP. Baseline: Goal status: MET on 08/11/2023  2.  Pt will report at least a 25% improvement in symptoms. Baseline:  Goal status: MET on 08/19/23   LONG TERM GOALS: Target date: 09/11/2023  Pt will be independent with advanced HEP. Baseline:  Goal status: IN PROGRESS  2.  Pt will increase right grip strength to at least 30 pounds to allow her to perform job related tasks. Baseline: 20 pounds Goal status: IN PROGRESS (see above)  3.  Pt will increase right UE strength to at least 4+/5 to allow her to perform job related tasks with increased ease. Baseline:  Goal status: IN PROGRESS  4.  Patient will report no increase in pain with lifting her cat and with drawing. Baseline:  Goal status: IN PROGRESS   PLAN: PT FREQUENCY: 1-2x/week  PT DURATION: 8 weeks  PLANNED INTERVENTIONS: 97164- PT Re-evaluation, 97110-Therapeutic exercises, 97530- Therapeutic activity, [id number]- Neuromuscular re-education, 97535- Self Care, 91478- Manual therapy, [phone]- Gait  training, [phone]- Aquatic Therapy, [phone]- Electrical stimulation (unattended), [phone]- Electrical stimulation (manual), [id number]- Vasopneumatic device, [id number]- Ultrasound, [id number]- Traction (mechanical), [id number]- Ionotophoresis 4mg /ml Dexamethasone, Patient/Family education, Balance training, Taping, Dry Needling, Joint mobilization, Joint manipulation, Spinal manipulation, Spinal mobilization, Cryotherapy, Moist heat, Therapeutic exercises, Therapeutic activity, Neuromuscular re-education, and Self Care  PLAN FOR NEXT SESSION: Assess and progress HEP as indicated, dry needling/manual therapy as indicated, strengthening.  Ask about MD appointment and possible shoe inserts.   Robyne Christen, PT, DPT 08/26/23, 1:25 PM  [facility] [address] [place], [place] [address] Phone # [phone] Fax [phone]

## 2023-09-01 ENCOUNTER — Encounter: Payer: Self-pay | Admitting: Podiatry

## 2023-09-01 ENCOUNTER — Ambulatory Visit: Admitting: Podiatry

## 2023-09-01 DIAGNOSIS — M2142 Flat foot [pes planus] (acquired), left foot: Secondary | ICD-10-CM

## 2023-09-01 DIAGNOSIS — M2141 Flat foot [pes planus] (acquired), right foot: Secondary | ICD-10-CM

## 2023-09-01 NOTE — Progress Notes (Signed)
 Impressions taken today patient wants to call insurance before we place order I will hold impressions for 90 days  Natalie Leblanc CPed, CFo, CFm

## 2023-09-01 NOTE — Progress Notes (Signed)
 He is moderately Subjective:  Patient ID: Natalie Leblanc, female    DOB: [birth year],  MRN: [id number] HPI Chief Complaint  Patient presents with   Knee Pain    Patient states her knee doctor referred her, she has pain in the right knee and doc was thinking feet may be the cause, flat feet, no pain, "thinking a device or something for my feet would help my knee"   New Patient (Initial Visit)    33 y.o. female presents with the above complaint.   ROS: Denies fever chills abdominal pain back pain chest pain shortness of breath.  Past Medical History:  Diagnosis Date   COVID    Depression    PTSD (post-traumatic stress disorder)    Past Surgical History:  Procedure Laterality Date   BREAST LUMPECTOMY Left    benign per patient    Current Outpatient Medications:    hydrOXYzine (ATARAX) 25 MG tablet, Take 25 mg by mouth at bedtime as needed., Disp: , Rfl:    Multiple Vitamin (MULTIVITAMIN) capsule, Take 1 capsule by mouth daily., Disp: , Rfl:    sertraline (ZOLOFT) 100 MG tablet, Take 100 mg by mouth daily., Disp: , Rfl:   Allergies  Allergen Reactions   Fish Allergy     GI upset   Mobic  [Meloxicam ]    Other Itching    Animal Dander   Review of Systems Objective:  There were no vitals filed for this visit.  General: Well developed, nourished, in no acute distress, alert and oriented x3   Dermatological: Skin is warm, dry and supple bilateral. Nails x 10 are well maintained; remaining integument appears unremarkable at this time. There are no open sores, no preulcerative lesions, no rash or signs of infection present.  Vascular: Dorsalis Pedis artery and Posterior Tibial artery pedal pulses are 2/4 bilateral with immedate capillary fill time. Pedal hair growth present. No varicosities and no lower extremity edema present bilateral.   Neruologic: Grossly intact via light touch bilateral. Vibratory intact via tuning fork bilateral. Protective threshold with Semmes  Wienstein monofilament intact to all pedal sites bilateral. Patellar and Achilles deep tendon reflexes 2+ bilateral. No Babinski or clonus noted bilateral.   Musculoskeletal: No gross boney pedal deformities bilateral. No pain, crepitus, or limitation noted with foot and ankle range of motion bilateral. Muscular strength 5/5 in all groups tested bilateral.  Her moderate-severe pronation weightbearing.  Negative Houbsher maneuver does not appear to have any coalitions.  Flexible pes planus bilateral  Gait: Unassisted, Nonantalgic.  Pronated gait with tenderness to the right medial knee   Radiographs:  None taken  Assessment & Plan:   Assessment: Pes planus right knee pain  Plan: He will see Natalie Leblanc today for orthotic fabrication.     Natalie Leblanc, [place]

## 2023-09-02 ENCOUNTER — Ambulatory Visit

## 2023-09-08 ENCOUNTER — Ambulatory Visit (HOSPITAL_COMMUNITY)
Admission: RE | Admit: 2023-09-08 | Discharge: 2023-09-08 | Disposition: A | Discharge status: Home / Self Care | Source: Ambulatory Visit | Attending: Nurse Practitioner | Admitting: Nurse Practitioner

## 2023-09-08 ENCOUNTER — Encounter (HOSPITAL_COMMUNITY): Payer: Self-pay

## 2023-09-08 VITALS — BP 111/79 | HR 92 | Temp 99.0°F | Resp 15

## 2023-09-08 DIAGNOSIS — J069 Acute upper respiratory infection, unspecified: Secondary | ICD-10-CM | POA: Diagnosis not present

## 2023-09-08 LAB — POC COVID19/FLU A&B COMBO
Covid Antigen, POC: NEGATIVE
Influenza A Antigen, POC: NEGATIVE
Influenza B Antigen, POC: NEGATIVE

## 2023-09-08 MED ORDER — AMOXICILLIN-POT CLAVULANATE 500-125 MG PO TABS: 1.0000 | ORAL_TABLET | Freq: Three times a day (TID) | ORAL | 0 refills | Status: AC

## 2023-09-08 NOTE — Discharge Instructions (Addendum)
 Your COVID, and Influenza  are all negative. You may have an Upper Respiratory Infection.  Due to your prolonged symptoms.  You have been prescribed Augmentin 500/125 mg TID for 7 days.  We encourage conservative treatment with symptom relief. We encourage you to use Tylenol  alternating with Ibuprofen  for your fever if not contraindicated. (Remember to use as directed do not exceed daily dosing recommendations) We also encourage salt water gargles for your sore throat. You should also consider throat lozenges and chloraseptic spray. Your cough can be soothed with a cough suppressant.

## 2023-09-08 NOTE — ED Triage Notes (Signed)
 Pt reports been sick since [facility]. Her nose is congested, throat, ears, and eyes hurts. Took Claritin, Hydroxyzine, humidifier, Nyquil, and generic antihistamine that haven't helped. Aaron Aas

## 2023-09-08 NOTE — ED Provider Notes (Signed)
 [facility]    CSN: [id number] Arrival date & time: 09/08/23  1149      History   Chief Complaint Chief Complaint  Patient presents with   Appointment    HPI Natalie Leblanc is a 33 y.o. female.   HPI  She is in today for evaluation of cough and congestion.  She endorses that she has had a sore throat with upper respiratory symptoms.  She is having heaviness around her eyes and ear fullness.  She has used her allergy medicine with little to no relief.  She endorses that she feels hot on the inside.  She has not tested for elevated temp.  She has some unknown exposures at work. Past Medical History:  Diagnosis Date   COVID    Depression    PTSD (post-traumatic stress disorder)     Patient Active Problem List   Diagnosis Date Noted   Medial epicondylitis of elbow, right 06/17/2023   Bicipital tendonitis of shoulder, right 06/17/2023   Spasms of the hands or feet 06/17/2023   Right wrist pain 09/30/2022   Ganglion cyst of dorsum of right wrist 09/30/2022   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 12/28/2019   Vitamin D  deficiency 12/23/2019   Depression    PTSD (post-traumatic stress disorder)     Past Surgical History:  Procedure Laterality Date   BREAST LUMPECTOMY Left    benign per patient    OB History     Gravida  0   Para  0   Term  0   Preterm  0   AB  0   Living  0      SAB  0   IAB  0   Ectopic  0   Multiple  0   Live Births  0            Home Medications    Prior to Admission medications   Medication Sig Start Date End Date Taking? Authorizing Provider  amoxicillin-clavulanate (AUGMENTIN) 500-125 MG tablet Take 1 tablet by mouth every 8 (eight) hours for 7 days. 09/08/23 09/15/23 Yes Gregoria Leas, NP  hydrOXYzine (ATARAX) 25 MG tablet Take 25 mg by mouth at bedtime as needed.    [provider]  Multiple Vitamin (MULTIVITAMIN) capsule Take 1 capsule by mouth daily.    [provider]  sertraline (ZOLOFT) 100 MG tablet Take 100 mg by mouth daily.    [provider]    Family History Family History  Problem Relation Age of Onset   Gestational diabetes Mother    Alcohol abuse Mother    Mental illness Mother    Eczema Sister    Depression Sister    Psychosis Maternal Aunt    Thyroid disease Sister    Depression Sister    Heart murmur Sister     Social History Social History   Tobacco Use   Smoking status: Never   Smokeless tobacco: Never  Vaping Use   Vaping status: Never Used  Substance Use Topics   Alcohol use: Yes    Comment: occasional   Drug use: No     Allergies   Fish allergy, Mobic  [meloxicam ], and Other   Review of Systems Review of Systems   Physical Exam Triage Vital Signs ED Triage Vitals  Encounter Vitals Group     BP 09/08/23 1208 111/79     Systolic BP Percentile --      Diastolic BP Percentile --  Pulse Rate 09/08/23 1208 92     Resp 09/08/23 1208 15     Temp 09/08/23 1208 99 F (37.2 C)     Temp Source 09/08/23 1208 Oral     SpO2 09/08/23 1208 97 %     Weight --      Height --      Head Circumference --      Peak Flow --      Pain Score 09/08/23 1207 8     Pain Loc --      Pain Education --      Exclude from Growth Chart --    No data found.  Updated Vital Signs BP 111/79 (BP Location: Left Arm)   Pulse 92   Temp 99 F (37.2 C) (Oral)   Resp 15   LMP 09/05/2023 (Exact Date)   SpO2 97%   Visual Acuity Right Eye Distance:   Left Eye Distance:   Bilateral Distance:    Right Eye Near:   Left Eye Near:    Bilateral Near:     Physical Exam Constitutional:      Appearance: She is normal weight. She is ill-appearing.  HENT:     Head: Normocephalic and atraumatic.     Right Ear: Tympanic membrane normal.     Left Ear: Tympanic membrane normal.     Nose: Nose normal.     Mouth/Throat:     Mouth: Mucous membranes are moist.  Eyes:     Pupils: Pupils are equal, round, and  reactive to light.  Cardiovascular:     Rate and Rhythm: Normal rate.     Pulses: Normal pulses.  Pulmonary:     Effort: Pulmonary effort is normal.  Musculoskeletal:        General: Normal range of motion.     Cervical back: Normal range of motion.  Skin:    General: Skin is warm.     Capillary Refill: Capillary refill takes less than 2 seconds.  Neurological:     General: No focal deficit present.     Mental Status: She is alert.  Psychiatric:        Mood and Affect: Mood normal.      UC Treatments / Results  Labs (all labs ordered are listed, but only abnormal results are displayed) Labs Reviewed  POC COVID19/FLU A&B COMBO    EKG   Radiology No results found.  Procedures Procedures (including critical care time)  Medications Ordered in UC Medications - No data to display  Initial Impression / Assessment and Plan / UC Course  I have reviewed the triage vital signs and the nursing notes.  Pertinent labs & imaging results that were available during my care of the patient were reviewed by me and considered in my medical decision making (see chart for details).     congestion Final Clinical Impressions(s) / UC Diagnoses   Final diagnoses:  Upper respiratory tract infection, unspecified type     Discharge Instructions      Your COVID, and Influenza  are all negative. You may have an Upper Respiratory Infection.  Due to your prolonged symptoms.  You have been prescribed Augmentin 500/125 mg TID for 7 days.  We encourage conservative treatment with symptom relief. We encourage you to use Tylenol  alternating with Ibuprofen  for your fever if not contraindicated. (Remember to use as directed do not exceed daily dosing recommendations) We also encourage salt water gargles for your sore throat. You should also consider throat lozenges and  chloraseptic spray. Your cough can be soothed with a cough suppressant.     ED Prescriptions     Medication Sig Dispense  Auth. Provider   amoxicillin-clavulanate (AUGMENTIN) 500-125 MG tablet Take 1 tablet by mouth every 8 (eight) hours for 7 days. 21 tablet Gregoria Leas, NP      PDMP not reviewed this encounter.   Eleanore Grey Pax, NP 09/08/23 1325

## 2023-09-09 ENCOUNTER — Ambulatory Visit: Admitting: Rehabilitative and Restorative Service Providers"

## 2023-09-11 ENCOUNTER — Encounter: Payer: Self-pay | Admitting: Internal Medicine

## 2023-09-16 ENCOUNTER — Ambulatory Visit: Attending: Sports Medicine

## 2023-09-16 DIAGNOSIS — M25521 Pain in right elbow: Secondary | ICD-10-CM | POA: Insufficient documentation

## 2023-09-16 DIAGNOSIS — M7701 Medial epicondylitis, right elbow: Secondary | ICD-10-CM | POA: Insufficient documentation

## 2023-09-16 DIAGNOSIS — M6281 Muscle weakness (generalized): Secondary | ICD-10-CM | POA: Insufficient documentation

## 2023-09-16 DIAGNOSIS — M25511 Pain in right shoulder: Secondary | ICD-10-CM | POA: Diagnosis present

## 2023-09-16 DIAGNOSIS — R208 Other disturbances of skin sensation: Secondary | ICD-10-CM | POA: Diagnosis present

## 2023-09-16 NOTE — Therapy (Signed)
 OUTPATIENT PHYSICAL THERAPY TREATMENT NOTE   Patient Name: Natalie Leblanc MRN: [id number] DOB:1991-03-13, 33 y.o., female Today's Date: 09/16/2023  END OF SESSION:  PT End of Session - 09/16/23 1321     Visit Number 5    Date for PT Re-Evaluation 10/28/23    Authorization Type [employer]    PT Start Time 1238    PT Stop Time 1319    PT Time Calculation (min) 41 min    Activity Tolerance Patient tolerated treatment well    Behavior During Therapy WFL for tasks assessed/performed               Past Medical History:  Diagnosis Date   COVID    Depression    PTSD (post-traumatic stress disorder)    Past Surgical History:  Procedure Laterality Date   BREAST LUMPECTOMY Left    benign per patient   Patient Active Problem List   Diagnosis Date Noted   Medial epicondylitis of elbow, right 06/17/2023   Bicipital tendonitis of shoulder, right 06/17/2023   Spasms of the hands or feet 06/17/2023   Right wrist pain 09/30/2022   Ganglion cyst of dorsum of right wrist 09/30/2022   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 12/28/2019   Vitamin D  deficiency 12/23/2019   Depression    PTSD (post-traumatic stress disorder)     PCP: Zilphia Hilt, Charyl Coppersmith, MD  REFERRING PROVIDER: Ulysees Gander, DO  REFERRING DIAG: [phone] (ICD-10-CM) - Right elbow pain M77.01 (ICD-10-CM) - Medial epicondylitis of right elbow M25.519 (ICD-10-CM) - Arthralgia of shoulder, unspecified laterality  THERAPY DIAG:  Muscle weakness - Plan: PT plan of care cert/re-cert  Pain in right elbow - Plan: PT plan of care cert/re-cert  Other disturbances of skin sensation  Right shoulder pain, unspecified chronicity - Plan: PT plan of care cert/re-cert  Medial epicondylitis of elbow, right - Plan: PT plan of care cert/re-cert  Rationale for Evaluation and Treatment: Rehabilitation  ONSET DATE: Last September (but had this injury in the past before)  SUBJECTIVE:                                                                                                                                                                                       SUBJECTIVE STATEMENT: I haven't been feeling good. I had a sinus infection and it is lingering.  I've not been able to do my exercises.  I want to finish PT for my Rt arm before moving on to my knee.  I still haven't gotten the inserts for my shoes so I want to get those first.  My Rt arm feels 50% better.    Hand  dominance: Right  PERTINENT HISTORY: S/P Right Ganglion Cyst removal on 10/16/2022, PTSD, History of right knee pain (possible right knee medial meniscal tear vs medial plical type syndrome following Dr Hermina Loosen)  PAIN: 09/16/23 Are you having pain? Yes: NPRS scale: 4-8/10 Pain location: right medial elbow and right shoulder Pain description: sharp Aggravating factors: movement, pushing, reaching, reaching overhead Relieving factors: medication, stretches   PRECAUTIONS: Other: Per documentation by Dr Cleora Daft, no official restrictions, however, he does feel that repetitive motions and heavy lifting do contribute to increased pain  RED FLAGS: None   WEIGHT BEARING RESTRICTIONS: No  FALLS:  Has patient fallen in last 6 months? 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 No specific internal derangement is identified to explain the patient's symptoms.  PATIENT SURVEYS :  Eval:  Cindia Crease  40.91 09/16/23: 31.1%  COGNITION: Overall cognitive status: Within functional limits for tasks assessed     SENSATION: Patient denies  POSTURE: Rounded shoulders, forward flexed  UPPER EXTREMITY ROM:   Eval:  Pt has ROM WFL, however, has pain with right elbow flexion/extension and supination/pronation  UPPER EXTREMITY MMT:  Eval:   Right shoulder and elbow strength of 4/5 Left shoulder/UE strength is 4+ to 5/5 grossly throughout Right grip strength of 28 lbs Left grip strength of 32 lbs Right key pinch strength is 10 pounds Left key pinch strength is 15 pounds  08/19/2023: Right grip strength of 28 lbs Left grip strength of 25 lbs  09/16/23:  Rt grip: 30#  Rt shoulder: flexion 4+5, abduction 4/5, IR 4/5, ER 4/5.  Rt elbow: flexion 4+/5, extension 4+/5-all with elbow pain   SHOULDER SPECIAL TESTS: Impingement tests: Hawkins/Kennedy impingement test: negative  Rotator cuff assessment: Empty can test: negative    PALPATION:  Tender to palpation around right shoulder region and biceps tendon insertion. Tenderness with muscle spasms noted in right medial elbow region   TODAY'S TREATMENT:                                                                                                                                           DATE: 09/16/2023 Nustep level 5 x6 min with PT present to discuss status Standing shoulder ER with red tband 2x8 Standing shoulder horizontal abduction with red tband 2x8 Standing 3 way shoulder elevation with 1# dumbbell x10 bilat Standing lower trap lift off 2x10  Standing shoulder rows with green tband 2x10 Standing shoulder extension with green tband 2x10 Shoulder IR with red band and horizontal adduction x10 ERO performed-see above measures   DATE: 08/26/2023 Nustep level 5 x6 min with PT present to discuss status Standing shoulder ER with red tband 2x8 Standing shoulder horizontal abduction with red tband 2x8 Standing 3 way shoulder elevation  with 1# dumbbell x10 bilat Standing lower trap lift off 2x10 Standing 4D scapular stabilization with small green spiky ball x20 bilat Standing shoulder rows with green tband 2x10 Standing shoulder extension with green tband 2x10 Quadruped alt UE/LE extension x10 Prone alt UE/LE extension x10 Supine 90/90 active hamstring stretch x5 bilat Trigger Point Dry Needling Subsequent Treatment: Instructions provided previously at initial dry needling treatment.  Patient Verbal Consent Given: Yes Education Handout Provided: Previously Provided Muscles Treated: bilat upper trap, bilat rhomboids, bilat suboccipitals Electrical Stimulation Performed: No Treatment Response/Outcome: Utilized skilled palpation to identify bony landmarks and trigger points.  Able to illicit twitch response and muscle elongation.  Soft tissue mobilization following to further promote tissue elongation.     DATE: 08/19/2023 Nustep level 5 x6 min with PT present to discuss status Standing shoulder ER with red tband 2x8 Standing shoulder horizontal abduction with red tband 2x8 Standing 4D scapular stabilization with small green spiky ball x10 bilat Standing 3 way shoulder elevation with 1# dumbbell x10 bilat Standing lower trap 2x10 Standing shoulder rows with red tband 2x10 Standing shoulder extension with red tband 2x8 Quadruped alt UE/LE extension 2x5 Prone alt UE/LE extension x10 Trigger Point Dry Needling Subsequent Treatment: Instructions provided previously at initial dry needling treatment.  Patient Verbal Consent Given: Yes Education Handout Provided: Previously Provided Muscles Treated: bilat upper trap, bilat rhomboids Electrical Stimulation Performed: No Treatment Response/Outcome: Utilized skilled palpation to identify bony landmarks and trigger points.  Able to illicit twitch response and muscle elongation.  Soft tissue mobilization following to further promote tissue elongation.       PATIENT  EDUCATION: Education details: Issued HEP Person educated: Patient Education method: Explanation, Demonstration, and Handouts Education comprehension: verbalized understanding and returned demonstration  HOME EXERCISE PROGRAM: Access Code: [email or web address] URL: [email or web address] Date: 08/19/2023 Prepared by: Chaneta Comer Menke  Exercises - Standing Ulnar Nerve Glide  - 1 x daily - 7 x weekly - 2 sets - 10 reps - Seated Wrist Extension Stretch  - 1 x daily - 7 x weekly - 2 reps - 20 sec hold - Wrist Prayer Stretch  - 1 x daily - 7 x weekly - 2 reps - 20 sec hold - Seated Scapular Retraction  - 1 x daily - 7 x weekly - 2 sets - 10 reps - Standing shoulder flexion wall slides  - 1 x daily - 7 x weekly - 2 sets - 10 reps - Standing Shoulder Internal Rotation with Anchored Resistance  - 1 x daily - 3 x weekly - 2 sets - 5-10 reps - Shoulder External Rotation with Anchored Resistance  - 1 x daily - 3 x weekly - 2 sets - 5-10 reps - Sleeper Stretch  - 1 x daily - 3 x weekly - 2 reps - 15-20 sec hold - Standing Shoulder Horizontal Adduction with Anchored Resistance  - 1 x daily - 3 x weekly - 2 sets - 5-10 reps - Bird Dog  - 1 x daily - 7 x weekly - 2 sets - 5-10 reps - Prone Alternating Arm and Leg Lifts  - 1 x daily - 7 x  weekly - 2 sets - 10 reps - Bird Dog on Counter  - 1 x daily - 7 x weekly - 2 sets - 10 reps  ASSESSMENT:  CLINICAL IMPRESSION: Pt had a lapse in treatment due to being sick.  She has not gotten her shoe inserts yet and wants to wait until she does to see if this will help her knee pain.  She wants to continue with her shoulder for a few more sessions.  Measures taken, see above-strength is better in some motions of Rt shoulder and Rt grip strength.  Pt has not been consistent with her HEP due to illness and we reviewed all today. QuickDASH is improved and pt reports 50% overall reduction in Rt UE pain.  Max tactile cues for scapular depression. She will resume  consistency with this as she is feeling better.    Patient continues to require skilled PT to progress towards goal related activities.  PT will evaluate her knee when she is ready.    OBJECTIVE IMPAIRMENTS: decreased strength, impaired perceived functional ability, increased muscle spasms, impaired sensation, impaired UE functional use, postural dysfunction, and pain.   ACTIVITY LIMITATIONS: carrying, lifting, and sleeping  PARTICIPATION LIMITATIONS: cleaning, laundry, shopping, community activity, and occupation  PERSONAL FACTORS: Past/current experiences, Time since onset of injury/illness/exacerbation, and 1-2 comorbidities: Right wrist ganglion cyst removal in July 2024, right medial epicondylitis are also affecting patient's functional outcome.   REHAB POTENTIAL: Good  CLINICAL DECISION MAKING: Evolving/moderate complexity  EVALUATION COMPLEXITY: Moderate  GOALS: Goals reviewed with patient? Yes  SHORT TERM GOALS: Target date: 08/14/2023  Pt will be independent with initial HEP. Baseline: Goal status: MET on 08/11/2023  2.  Pt will report at least a 25% improvement in symptoms. Baseline:  Goal status: MET on 08/19/23   LONG TERM GOALS: Target date: 10/28/2023    Pt will be independent with advanced HEP. Baseline: hasn't been able to be consistent due to being sick (09/16/23) Goal status: IN PROGRESS  2.  Pt will increase right grip strength to at least 30 pounds to allow her to perform job related tasks. Baseline: 30 pounds Goal status: met  3.  Pt will increase right UE strength to at least 4+/5 to allow her to perform job related tasks with increased ease. Baseline: see above (09/16/23 Goal status: IN PROGRESS  4.  Patient will report no increase in pain with lifting her cat and with drawing. Baseline: 50% better-4-8/10 Rt UE pain with lifting cat, hand cramps with drawing (09/16/23) Goal status: IN PROGRESS    5.  Improve QuickDash to < or = to 20% to improve  function    Baseline: 31.1%    Goal Status: NEW PLAN: PT FREQUENCY: 1-2x/week  PT DURATION: 6 weeks  PLANNED INTERVENTIONS: 97164- PT Re-evaluation, 97110-Therapeutic exercises, 97530- Therapeutic activity, 97112- Neuromuscular re-education, 97535- Self Care, 13086- Manual therapy, [id number]- Gait training, [phone]- Aquatic Therapy, [phone]- Electrical stimulation (unattended), [phone]- Electrical stimulation (manual), [id number]- Vasopneumatic device, [id number]- Ultrasound, [id number]- Traction (mechanical), [id number]- Ionotophoresis 4mg /ml Dexamethasone, Patient/Family education, Balance training, Taping, Dry Needling, Joint mobilization, Joint manipulation, Spinal manipulation, Spinal mobilization, Cryotherapy, Moist heat, Therapeutic exercises, Therapeutic activity, Neuromuscular re-education, and Self Care  PLAN FOR NEXT SESSION: Assess and progress HEP as indicated, dry needling/manual therapy as indicated, strengthening.  Will evaluate knee from [facility] when she completes PT for Rt UE-per pt request   Luella Sager, PT 09/16/23 1:23 PM   [facility] [address], [address] [place], [place] [address] Phone #  [phone] Fax [phone]

## 2023-09-25 IMAGING — CT CT ABD-PELV W/ CM
2 of 4 series · 15 of 46 positions shown, 17 images · IV contrast (OMNIPAQUE 300)
Comparison: None.

CLINICAL DATA: Intermittent lower abdominal pain and loose stools
postprandial for a few years. Nausea

EXAM:
CT ABDOMEN AND PELVIS WITH CONTRAST
TECHNIQUE: Multidetector CT imaging of the abdomen and pelvis was performed
using the standard protocol following bolus administration of
intravenous contrast.

[Series 2: abd/pel w · axial · 0.60mm/px · z∈[-367,-22]mm · 12 of 80 slices shown, 14 images]
[im 7/80  soft-tissue]
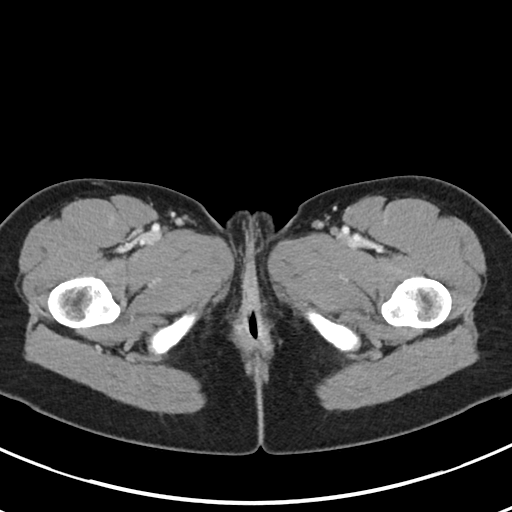
[im 7/80  bone]
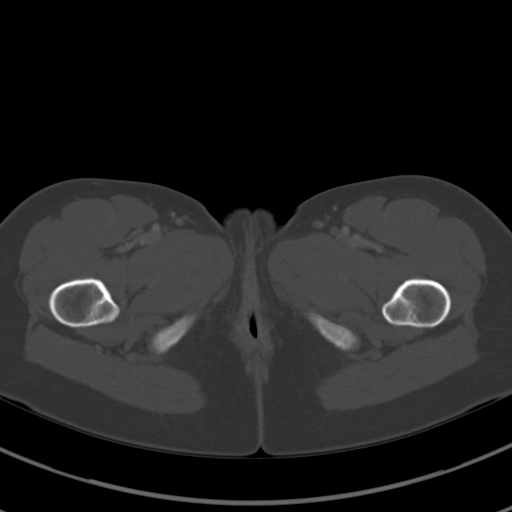
[im 13/80  soft-tissue]
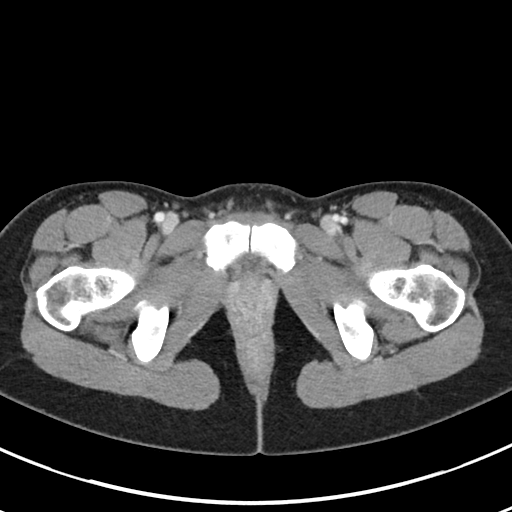
[im 19/80  soft-tissue]
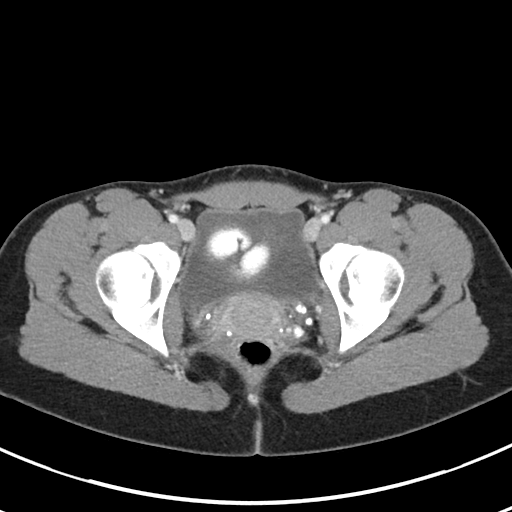
[im 26/80  soft-tissue]
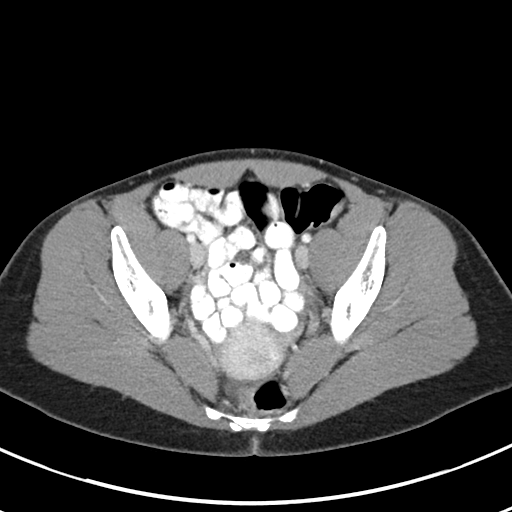
[im 32/80  soft-tissue]
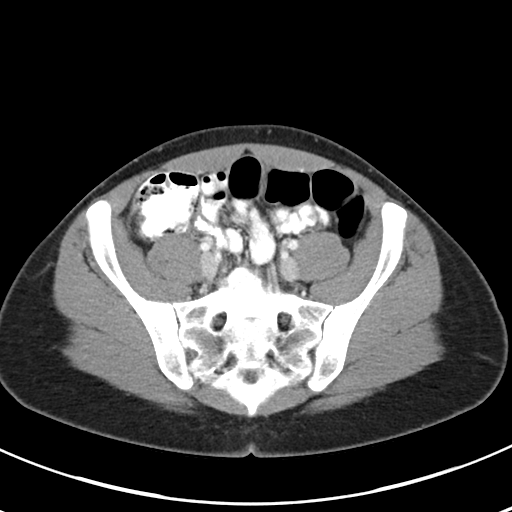
[im 38/80  soft-tissue]
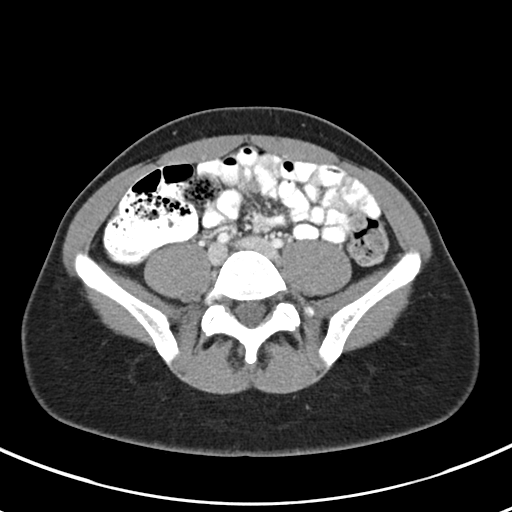
[im 45/80  soft-tissue]
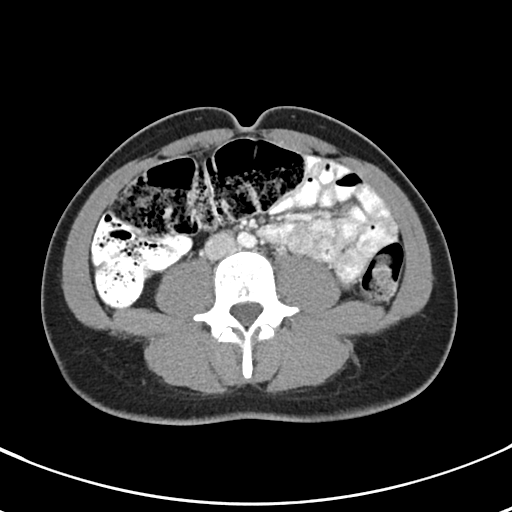
[im 51/80  soft-tissue]
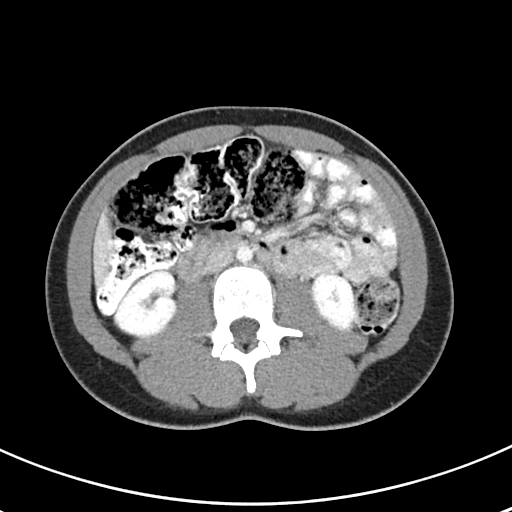
[im 57/80  soft-tissue]
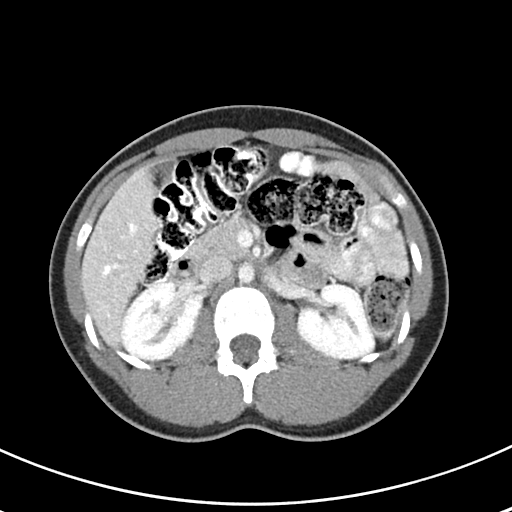
[im 57/80  bone]
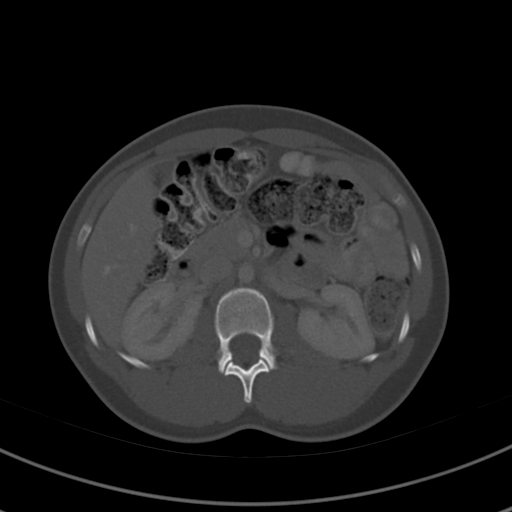
[im 64/80  soft-tissue]
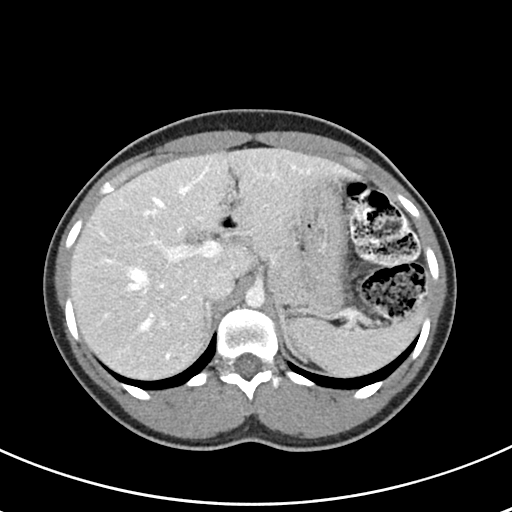
[im 70/80  soft-tissue]
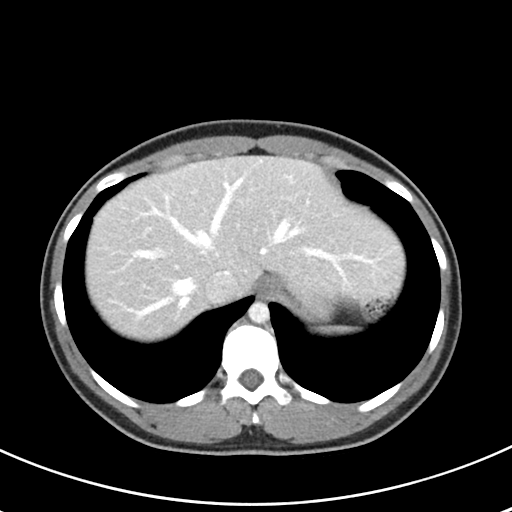
[im 76/80  soft-tissue]
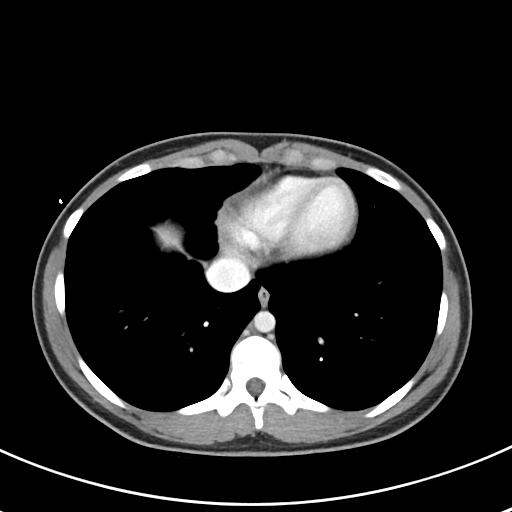

[Series 5: coronal st · coronal · 0.52mm/px · 3 of 69 slices shown]
[im 23/69  soft-tissue]
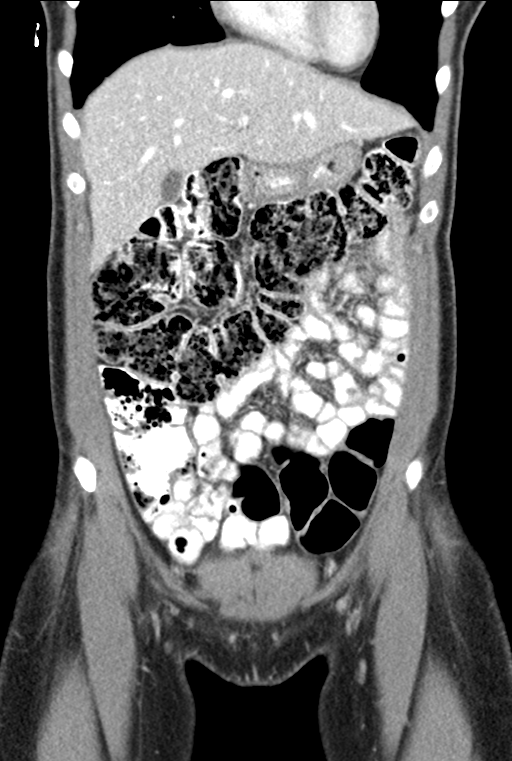
[im 31/69  soft-tissue]
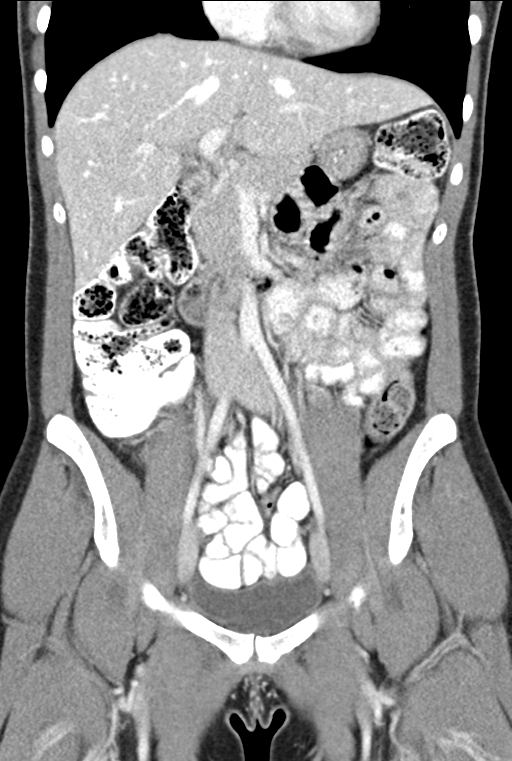
[im 38/69  soft-tissue]
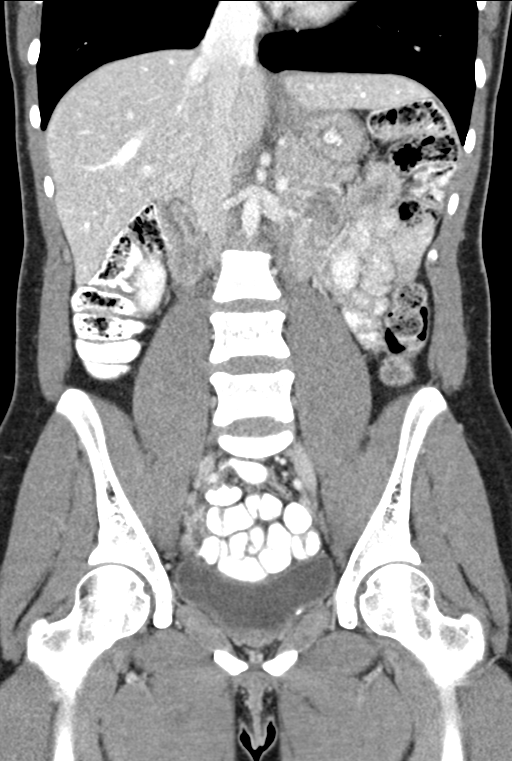

[15 of 46 positions shown; findings below may reference images not displayed]

RADIATION DOSE REDUCTION: This exam was performed according to the
departmental dose-optimization program which includes automated
exposure control, adjustment of the mA and/or kV according to
patient size and/or use of iterative reconstruction technique.

CONTRAST:  80mL OMNIPAQUE IOHEXOL 300 MG/ML  SOLN
FINDINGS: Lower chest: No acute abnormality.

Hepatobiliary: No suspicious hepatic lesion. Gallbladder is
unremarkable. No biliary ductal dilation.

Pancreas: No pancreatic ductal dilation or evidence of acute
inflammation.

Spleen: Normal in size without focal abnormality.

Adrenals/Urinary Tract: Bilateral adrenal glands appear normal. No
hydronephrosis. Symmetric bilateral renal enhancement.

Stomach/Bowel: Radiopaque enteric contrast material traverses the
splenic flexure. Stomach is minimally distended limiting evaluation.
No pathologic dilation of small or large bowel. Large volume of
formed stool in the colon suggestive of constipation. No evidence of
acute bowel inflammation.

Vascular/Lymphatic: Normal caliber abdominal aorta. Numerous pelvic
phleboliths. No pathologically enlarged abdominal or pelvic lymph
nodes.

Reproductive: Uterus and left adnexa are unremarkable. Physiologic
corpus luteal cyst in the right ovary.

Other: Trace pelvic free fluid within physiologic normal limits.

Musculoskeletal: No acute or significant osseous findings.
IMPRESSION: 1. No acute abdominopelvic findings.
2. Large volume of formed stool in the colon suggestive of
constipation.

## 2023-10-08 ENCOUNTER — Ambulatory Visit: Attending: Sports Medicine

## 2023-10-08 DIAGNOSIS — R252 Cramp and spasm: Secondary | ICD-10-CM | POA: Insufficient documentation

## 2023-10-08 DIAGNOSIS — M25511 Pain in right shoulder: Secondary | ICD-10-CM | POA: Insufficient documentation

## 2023-10-08 DIAGNOSIS — M7701 Medial epicondylitis, right elbow: Secondary | ICD-10-CM | POA: Diagnosis present

## 2023-10-08 DIAGNOSIS — R208 Other disturbances of skin sensation: Secondary | ICD-10-CM | POA: Diagnosis present

## 2023-10-08 DIAGNOSIS — M6281 Muscle weakness (generalized): Secondary | ICD-10-CM | POA: Insufficient documentation

## 2023-10-08 DIAGNOSIS — M25521 Pain in right elbow: Secondary | ICD-10-CM | POA: Insufficient documentation

## 2023-10-08 NOTE — Therapy (Signed)
 OUTPATIENT PHYSICAL THERAPY TREATMENT NOTE   Patient Name: Natalie Leblanc MRN: [id number] DOB:05-25-1990, 33 y.o., female Today's Date: 10/08/2023  END OF SESSION:  PT End of Session - 10/08/23 1314     Visit Number 7    Date for PT Re-Evaluation 10/28/23    Authorization Type UHC             Past Medical History:  Diagnosis Date   COVID    Depression    PTSD (post-traumatic stress disorder)    Past Surgical History:  Procedure Laterality Date   BREAST LUMPECTOMY Left    benign per patient   Patient Active Problem List   Diagnosis Date Noted   Medial epicondylitis of elbow, right 06/17/2023   Bicipital tendonitis of shoulder, right 06/17/2023   Spasms of the hands or feet 06/17/2023   Right wrist pain 09/30/2022   Ganglion cyst of dorsum of right wrist 09/30/2022   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 12/28/2019   Vitamin D  deficiency 12/23/2019   Depression    PTSD (post-traumatic stress disorder)     PCP: Theophilus Andrews, Tully GRADE, MD  REFERRING PROVIDER: Leonce Katz, DO  REFERRING DIAG: [phone] (ICD-10-CM) - Right elbow pain M77.01 (ICD-10-CM) - Medial epicondylitis of right elbow M25.519 (ICD-10-CM) - Arthralgia of shoulder, unspecified laterality  THERAPY DIAG:  Muscle weakness  Pain in right elbow  Other disturbances of skin sensation  Right shoulder pain, unspecified chronicity  Rationale for Evaluation and Treatment: Rehabilitation  ONSET DATE: Last September (but had this injury in the past before)  SUBJECTIVE:                                                                                                                                                                                      SUBJECTIVE STATEMENT: My elbow is hurting more since I bumped it on the desk.     Hand dominance: Right  PERTINENT HISTORY: S/P Right Ganglion Cyst removal on 10/16/2022, PTSD, History of right knee pain (possible right knee  medial meniscal tear vs medial plical type syndrome following Dr Genelle)  PAIN: 10/08/23 Are you having pain? Yes: NPRS scale: 6-7/10 Pain location: right medial elbow and right shoulder Pain description: sharp Aggravating factors: movement, pushing, reaching, reaching overhead Relieving factors: medication, stretches   PRECAUTIONS: Other: Per documentation by Dr Leonce, no official restrictions, however, he does feel that repetitive motions and heavy lifting do contribute to increased pain  RED FLAGS: None   WEIGHT BEARING RESTRICTIONS: No  FALLS:  Has patient fallen in last 6 months? 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 No specific internal derangement is identified to explain the patient's symptoms.  PATIENT SURVEYS :  Eval:  Junie Palin 40.91 09/16/23: 31.1%  COGNITION: Overall cognitive status: Within functional limits for tasks assessed     SENSATION: Patient denies  POSTURE: Rounded shoulders, forward flexed  UPPER EXTREMITY ROM:   Eval:  Pt has ROM WFL, however, has pain with right elbow flexion/extension and supination/pronation  UPPER EXTREMITY MMT:  Eval:   Right shoulder and elbow strength of 4/5 Left shoulder/UE strength is 4+ to 5/5 grossly throughout Right grip strength of 28 lbs Left grip strength of 32 lbs Right key pinch strength is 10 pounds Left key pinch strength is 15 pounds  08/19/2023: Right grip strength of 28  lbs Left grip strength of 25 lbs  09/16/23:  Rt grip: 30#  Rt shoulder: flexion 4+5, abduction 4/5, IR 4/5, ER 4/5.  Rt elbow: flexion 4+/5, extension 4+/5-all with elbow pain   SHOULDER SPECIAL TESTS: Impingement tests: Hawkins/Kennedy impingement test: negative  Rotator cuff assessment: Empty can test: negative    PALPATION:  Tender to palpation around right shoulder region and biceps tendon insertion. Tenderness with muscle spasms noted in right medial elbow region   TODAY'S TREATMENT:                                                                                                                                         DATE: 10/08/2023 Arm bike: level 1.2x 6 minutes (3/3)-PT present to discuss progress  Standing shoulder horizontal abduction with red tband 2x8 Standing 3 way shoulder elevation with 1# dumbbell x10 Rt Standing lower trap lift off 2x10 Wrist: flexion, radial deviation and extension with 1# weight x20 each  Biceps curl and hammer curl 2# 2x10 bil  Triceps extension with yellow band 2x10 Ice massage with Cryostim to Rt medial triceps insertion- PT educated pt on how to do this at home  DATE: 09/16/2023 Nustep level 5 x6 min with PT present to discuss status Standing shoulder ER with red tband 2x8 Standing shoulder horizontal abduction with red tband 2x8 Standing 3 way shoulder elevation with 1# dumbbell x10 bilat Standing lower trap lift off 2x10 Standing shoulder rows with green tband 2x10 Standing shoulder extension with green tband 2x10 Shoulder IR with red band and horizontal adduction x10 ERO performed-see above measures   DATE: 08/26/2023 Nustep level 5 x6 min with PT present to discuss status Standing shoulder ER  with red tband 2x8 Standing shoulder horizontal abduction with red tband 2x8 Standing 3 way shoulder elevation with 1# dumbbell x10 bilat Standing lower trap lift off 2x10 Standing 4D scapular stabilization with small green spiky ball x20  bilat Standing shoulder rows with green tband 2x10 Standing shoulder extension with green tband 2x10 Quadruped alt UE/LE extension x10 Prone alt UE/LE extension x10 Supine 90/90 active hamstring stretch x5 bilat Trigger Point Dry Needling Subsequent Treatment: Instructions provided previously at initial dry needling treatment.  Patient Verbal Consent Given: Yes Education Handout Provided: Previously Provided Muscles Treated: bilat upper trap, bilat rhomboids, bilat suboccipitals Electrical Stimulation Performed: No Treatment Response/Outcome: Utilized skilled palpation to identify bony landmarks and trigger points.  Able to illicit twitch response and muscle elongation.  Soft tissue mobilization following to further promote tissue elongation.     PATIENT EDUCATION: Education details: Issued HEP, ice massage (10/08/23) Person educated: Patient Education method: Explanation, Demonstration, and Handouts Education comprehension: verbalized understanding and returned demonstration  HOME EXERCISE PROGRAM: Access Code: [email or web address] URL: [email or web address] Date: 08/19/2023 Prepared by: Jarrell Menke  Exercises - Standing Ulnar Nerve Glide  - 1 x daily - 7 x weekly - 2 sets - 10 reps - Seated Wrist Extension Stretch  - 1 x daily - 7 x weekly - 2 reps - 20 sec hold - Wrist Prayer Stretch  - 1 x daily - 7 x weekly - 2 reps - 20 sec hold - Seated Scapular Retraction  - 1 x daily - 7 x weekly - 2 sets - 10 reps - Standing shoulder flexion wall slides  - 1 x daily - 7 x weekly - 2 sets - 10 reps - Standing Shoulder Internal Rotation with Anchored Resistance  - 1 x daily - 3 x weekly - 2 sets - 5-10 reps - Shoulder External Rotation with Anchored Resistance  - 1 x daily - 3 x weekly - 2 sets - 5-10 reps - Sleeper Stretch  - 1 x daily - 3 x weekly - 2 reps - 15-20 sec hold - Standing Shoulder Horizontal Adduction with Anchored Resistance  - 1 x daily - 3 x weekly - 2 sets - 5-10  reps - Bird Dog  - 1 x daily - 7 x weekly - 2 sets - 5-10 reps - Prone Alternating Arm and Leg Lifts  - 1 x daily - 7 x weekly - 2 sets - 10 reps - Bird Dog on Counter  - 1 x daily - 7 x weekly - 2 sets - 10 reps  ASSESSMENT:  CLINICAL IMPRESSION: Lapse in treatment.  Pt arrives with 6-9/10 Rt medial elbow pain after hitting it on a desk last week.  Pt has been more consistent with exercises at home now that she is not feeling sick.  Pt responded well to cryostim and PT educated pt on how to do ice massage at home.  She was able to do wrist and elbow weights without increased pain.    Patient continues to require skilled PT to progress towards goal related activities.  PT will evaluate her knee when she is ready.    OBJECTIVE IMPAIRMENTS: decreased strength, impaired perceived functional ability, increased muscle spasms, impaired sensation, impaired UE functional use, postural dysfunction, and pain.   ACTIVITY LIMITATIONS: carrying, lifting, and sleeping  PARTICIPATION LIMITATIONS: cleaning, laundry, shopping, community activity, and occupation  PERSONAL FACTORS: Past/current experiences, Time since onset of injury/illness/exacerbation, and 1-2 comorbidities: Right wrist ganglion cyst removal in July 2024, right medial epicondylitis are  also affecting patient's functional outcome.   REHAB POTENTIAL: Good  CLINICAL DECISION MAKING: Evolving/moderate complexity  EVALUATION COMPLEXITY: Moderate  GOALS: Goals reviewed with patient? Yes  SHORT TERM GOALS: Target date: 08/14/2023  Pt will be independent with initial HEP. Baseline: Goal status: MET on 08/11/2023  2.  Pt will report at least a 25% improvement in symptoms. Baseline:  Goal status: MET on 08/19/23   LONG TERM GOALS: Target date: 10/28/2023    Pt will be independent with advanced HEP. Baseline: hasn't been able to be consistent due to being sick (09/16/23) Goal status: IN PROGRESS  2.  Pt will increase right grip strength  to at least 30 pounds to allow her to perform job related tasks. Baseline: 30 pounds Goal status: met  3.  Pt will increase right UE strength to at least 4+/5 to allow her to perform job related tasks with increased ease. Baseline: see above (09/16/23 Goal status: IN PROGRESS  4.  Patient will report no increase in pain with lifting her cat and with drawing. Baseline: 50% better-4-8/10 Rt UE pain with lifting cat, hand cramps with drawing (09/16/23) Goal status: IN PROGRESS    5.  Improve QuickDash to < or = to 20% to improve function    Baseline: 31.1%    Goal Status: NEW PLAN: PT FREQUENCY: 1-2x/week  PT DURATION: 6 weeks  PLANNED INTERVENTIONS: 97164- PT Re-evaluation, 97110-Therapeutic exercises, 97530- Therapeutic activity, 97112- Neuromuscular re-education, 97535- Self Care, 02859- Manual therapy, [id number]- Gait training, [phone]- Aquatic Therapy, [phone]- Electrical stimulation (unattended), [phone]- Electrical stimulation (manual), [id number]- Vasopneumatic device, [id number]- Ultrasound, [id number]- Traction (mechanical), [id number]- Ionotophoresis 4mg /ml Dexamethasone, Patient/Family education, Balance training, Taping, Dry Needling, Joint mobilization, Joint manipulation, Spinal manipulation, Spinal mobilization, Cryotherapy, Moist heat, Therapeutic exercises, Therapeutic activity, Neuromuscular re-education, and Self Care  PLAN FOR NEXT SESSION: Assess and progress HEP as indicated, dry needling/manual therapy as indicated, strengthening.  Will evaluate knee from [facility] when she completes PT for Rt UE-per pt request   Burnard Joy, PT 10/08/23 1:15 PM   [facility] [address], [address] [place], [place] [address] Phone # [phone] Fax [phone]

## 2023-10-13 ENCOUNTER — Ambulatory Visit: Admitting: Rehabilitative and Restorative Service Providers"

## 2023-10-20 ENCOUNTER — Ambulatory Visit

## 2023-10-20 DIAGNOSIS — M25521 Pain in right elbow: Secondary | ICD-10-CM

## 2023-10-20 DIAGNOSIS — R252 Cramp and spasm: Secondary | ICD-10-CM

## 2023-10-20 DIAGNOSIS — M7701 Medial epicondylitis, right elbow: Secondary | ICD-10-CM

## 2023-10-20 DIAGNOSIS — M6281 Muscle weakness (generalized): Secondary | ICD-10-CM

## 2023-10-20 DIAGNOSIS — M25511 Pain in right shoulder: Secondary | ICD-10-CM

## 2023-10-20 NOTE — Therapy (Signed)
 OUTPATIENT PHYSICAL THERAPY TREATMENT NOTE   Patient Name: Natalie Leblanc MRN: [id number] DOB:December 09, 1990, 33 y.o., female Today's Date: 10/20/2023  END OF SESSION:  PT End of Session - 10/20/23 1313     Visit Number 8    Date for PT Re-Evaluation 10/28/23    Authorization Type UHC    PT Start Time 1233    PT Stop Time 1314    PT Time Calculation (min) 41 min    Activity Tolerance Patient tolerated treatment well    Behavior During Therapy WFL for tasks assessed/performed              Past Medical History:  Diagnosis Date   COVID    Depression    PTSD (post-traumatic stress disorder)    Past Surgical History:  Procedure Laterality Date   BREAST LUMPECTOMY Left    benign per patient   Patient Active Problem List   Diagnosis Date Noted   Medial epicondylitis of elbow, right 06/17/2023   Bicipital tendonitis of shoulder, right 06/17/2023   Spasms of the hands or feet 06/17/2023   Right wrist pain 09/30/2022   Ganglion cyst of dorsum of right wrist 09/30/2022   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 12/28/2019   Vitamin D  deficiency 12/23/2019   Depression    PTSD (post-traumatic stress disorder)     PCP: Theophilus Andrews, Tully GRADE, MD  REFERRING PROVIDER: Leonce Katz, DO  REFERRING DIAG: [phone] (ICD-10-CM) - Right elbow pain M77.01 (ICD-10-CM) - Medial epicondylitis of right elbow M25.519 (ICD-10-CM) - Arthralgia of shoulder, unspecified laterality  THERAPY DIAG:  Muscle weakness  Pain in right elbow  Right shoulder pain, unspecified chronicity  Medial epicondylitis of elbow, right  Cramp and spasm  Rationale for Evaluation and Treatment: Rehabilitation  ONSET DATE: Last September (but had this injury in the past before)  SUBJECTIVE:                                                                                                                                                                                       SUBJECTIVE STATEMENT: Feeling better than last session.  I'm doing my exercises.  I'm sore because my coworker was out for 2 days and I did some of her work.   Hand dominance: Right  PERTINENT HISTORY: S/P Right Ganglion Cyst removal on 10/16/2022, PTSD, History of right knee pain (possible right knee medial meniscal tear vs medial plical type syndrome following Dr Genelle)  PAIN: 10/20/23 Are you having pain? Yes: NPRS scale: 6-7/10 Pain location: right medial elbow and right shoulder Pain description: sharp Aggravating factors: movement, pushing, reaching, reaching overhead Relieving factors: medication, stretches  PRECAUTIONS: Other: Per documentation by Dr Leonce, no official restrictions, however, he does feel that repetitive motions and heavy lifting do contribute to increased pain  RED FLAGS: None   WEIGHT BEARING RESTRICTIONS: No  FALLS:  Has patient fallen in last 6 months? 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 No specific internal derangement is identified to explain the patient's symptoms.  PATIENT SURVEYS :  Eval:  Junie Palin 40.91 09/16/23: 31.1%  COGNITION: Overall cognitive status: Within functional limits for tasks assessed     SENSATION: Patient denies  POSTURE: Rounded shoulders, forward flexed  UPPER EXTREMITY ROM:   Eval:  Pt has ROM WFL, however, has pain with right elbow flexion/extension and  supination/pronation  UPPER EXTREMITY MMT:  Eval:   Right shoulder and elbow strength of 4/5 Left shoulder/UE strength is 4+ to 5/5 grossly throughout Right grip strength of 28 lbs Left grip strength of 32 lbs Right key pinch strength is 10 pounds Left key pinch strength is 15 pounds  08/19/2023: Right grip strength of 28 lbs Left grip strength of 25 lbs  09/16/23:  Rt grip: 30#  Rt shoulder: flexion 4+5, abduction 4/5, IR 4/5, ER 4/5.  Rt elbow: flexion 4+/5, extension 4+/5-all with elbow pain   SHOULDER SPECIAL TESTS: Impingement tests: Hawkins/Kennedy impingement test: negative  Rotator cuff assessment: Empty can test: negative    PALPATION:  Tender to palpation around right shoulder region and biceps tendon insertion. Tenderness with muscle spasms noted in right medial elbow region   TODAY'S TREATMENT:      DATE: 10/20/2023 Arm bike: level 1.2 x 6 minutes (3/3)-PT present to discuss progress  Standing shoulder horizontal abduction with red tband 2x8 Standing 3 way raises with 1# dumbbell 2x10 bil Wall clocks: blue loop x5 Rt and Lt Wrist: flexion, radial deviation and extension with 1# weight x20 each  Biceps curl and hammer curl 3# 2x10 bil  Triceps extension with yellow band 2x10 Ice massage with Cryostim to Rt medial triceps insertion                                                                                                                                     DATE: 10/08/2023 Arm bike: level 1.2x 6 minutes (3/3)-PT present to discuss progress  Standing shoulder horizontal abduction with red tband 2x8 Standing 3 way shoulder elevation with 1# dumbbell x10 Rt Standing lower trap lift  off 2x10 Wrist: flexion, radial deviation and extension with 1# weight x20 each  Biceps curl and hammer curl 2# 2x10 bil  Triceps extension with yellow band 2x10 Ice massage with Cryostim to Rt medial triceps insertion- PT educated pt on how to do this at home  DATE:  09/16/2023 Nustep level 5 x6 min with PT present to discuss status Standing shoulder ER with red tband 2x8 Standing shoulder horizontal abduction with red tband 2x8 Standing 3 way shoulder elevation with 1# dumbbell x10 bilat Standing lower trap lift off 2x10 Standing shoulder rows with green tband 2x10 Standing shoulder extension with green tband 2x10 Shoulder IR with red band and horizontal adduction x10 ERO performed-see above measures   PATIENT EDUCATION: Education details: Issued HEP, ice massage (10/08/23) Person educated: Patient Education method: Explanation, Demonstration, and Handouts Education comprehension: verbalized understanding and returned demonstration  HOME EXERCISE PROGRAM: Access Code: [email or web address] URL: [email or web address] Date: 08/19/2023 Prepared by: Jarrell Menke  Exercises - Standing Ulnar Nerve Glide  - 1 x daily - 7 x weekly - 2 sets - 10 reps - Seated Wrist Extension Stretch  - 1 x daily - 7 x weekly - 2 reps - 20 sec hold - Wrist Prayer Stretch  - 1 x daily - 7 x weekly - 2 reps - 20 sec hold - Seated Scapular Retraction  - 1 x daily - 7 x weekly - 2 sets - 10 reps - Standing shoulder flexion wall slides  - 1 x daily - 7 x weekly - 2 sets - 10 reps - Standing Shoulder Internal Rotation with Anchored Resistance  - 1 x daily - 3 x weekly - 2 sets - 5-10 reps - Shoulder External Rotation with Anchored Resistance  - 1 x daily - 3 x weekly - 2 sets - 5-10 reps - Sleeper Stretch  - 1 x daily - 3 x weekly - 2 reps - 15-20 sec hold - Standing Shoulder Horizontal Adduction with Anchored Resistance  - 1 x daily - 3 x weekly - 2 sets - 5-10 reps - Bird Dog  - 1 x daily - 7 x weekly - 2 sets - 5-10 reps - Prone Alternating Arm and Leg Lifts  - 1 x daily - 7 x weekly - 2 sets - 10 reps - Bird Dog on Counter  - 1 x daily - 7 x weekly - 2 sets - 10 reps  ASSESSMENT:  CLINICAL IMPRESSION: 2 week lapse sine last treatment. She continues to report 6-7/10 Rt  medial elbow pain and no significant change in symptoms.  She was able to do wrist and elbow weights without increased pain.   PT monitored for technique, pain, and provided verbal and tactile cues for alignment.  Patient continues to require skilled PT to progress towards goal related activities.  PT will evaluate her knee when she is ready. ERO needed next session regarding Rt UE pain.    OBJECTIVE IMPAIRMENTS: decreased strength, impaired perceived functional ability, increased muscle spasms, impaired sensation, impaired UE functional use, postural dysfunction, and pain.   ACTIVITY LIMITATIONS: carrying, lifting, and sleeping  PARTICIPATION LIMITATIONS: cleaning, laundry, shopping, community activity, and occupation  PERSONAL FACTORS: Past/current experiences, Time since onset of injury/illness/exacerbation, and 1-2 comorbidities: Right wrist ganglion cyst removal in July 2024, right medial epicondylitis are also affecting patient's functional outcome.   REHAB POTENTIAL: Good  CLINICAL DECISION MAKING: Evolving/moderate complexity  EVALUATION COMPLEXITY: Moderate  GOALS: Goals reviewed with patient? Yes  SHORT TERM GOALS: Target date: 08/14/2023  Pt will be independent with initial HEP. Baseline: Goal status: MET on 08/11/2023  2.  Pt will report at least a 25% improvement in symptoms. Baseline:  Goal status: MET on 08/19/23   LONG TERM GOALS: Target date: 10/28/2023    Pt will be independent with advanced HEP. Baseline: hasn't been able to be consistent due to being sick (09/16/23) Goal status: IN PROGRESS  2.  Pt will increase right grip strength to at least 30 pounds to allow her to perform job related tasks. Baseline: 30 pounds Goal status: met  3.  Pt will increase right UE strength to at least 4+/5 to allow her to perform job related tasks with increased ease. Baseline: see above (09/16/23 Goal status: IN PROGRESS  4.  Patient will report no increase in pain with  lifting her cat and with drawing. Baseline: 50% better-4-8/10 Rt UE pain with lifting cat, hand cramps with drawing (09/16/23) Goal status: IN PROGRESS    5.  Improve QuickDash to < or = to 20% to improve function    Baseline: 31.1%    Goal Status: NEW PLAN: PT FREQUENCY: 1-2x/week  PT DURATION: 6 weeks  PLANNED INTERVENTIONS: 97164- PT Re-evaluation, 97110-Therapeutic exercises, 97530- Therapeutic activity, 97112- Neuromuscular re-education, 97535- Self Care, 02859- Manual therapy, [id number]- Gait training, [id number]- Aquatic Therapy, [phone]- Electrical stimulation (unattended), [phone]- Electrical stimulation (manual), [id number]- Vasopneumatic device, [id number]- Ultrasound, [id number]- Traction (mechanical), [id number]- Ionotophoresis 4mg /ml Dexamethasone, Patient/Family education, Balance training, Taping, Dry Needling, Joint mobilization, Joint manipulation, Spinal manipulation, Spinal mobilization, Cryotherapy, Moist heat, Therapeutic exercises, Therapeutic activity, Neuromuscular re-education, and Self Care  PLAN FOR NEXT SESSION: Assess and progress HEP as indicated, dry needling/manual therapy as indicated, strengthening.  Will evaluate knee from [facility] when she completes PT for Rt UE-per pt request   Burnard Joy, PT 10/20/23 1:15 PM   [facility] [address] [place], [place] [address] Phone # [phone] Fax [phone]

## 2023-10-26 ENCOUNTER — Ambulatory Visit: Admitting: Rehabilitative and Restorative Service Providers"

## 2023-10-26 ENCOUNTER — Encounter: Payer: Self-pay | Admitting: Rehabilitative and Restorative Service Providers"

## 2023-10-26 DIAGNOSIS — M6281 Muscle weakness (generalized): Secondary | ICD-10-CM

## 2023-10-26 DIAGNOSIS — M25511 Pain in right shoulder: Secondary | ICD-10-CM

## 2023-10-26 DIAGNOSIS — M7701 Medial epicondylitis, right elbow: Secondary | ICD-10-CM

## 2023-10-26 DIAGNOSIS — M25521 Pain in right elbow: Secondary | ICD-10-CM

## 2023-10-26 NOTE — Therapy (Signed)
 OUTPATIENT PHYSICAL THERAPY TREATMENT NOTE AND DISCHARGE SUMMARY   Patient Name: Natalie Leblanc MRN: [id number] DOB:01-12-1991, 33 y.o., female Today's Date: 10/26/2023  END OF SESSION:  PT End of Session - 10/26/23 1240     Visit Number 9    Date for PT Re-Evaluation 10/28/23    Authorization Type [employer]    PT Start Time 1232    PT Stop Time 1315    PT Time Calculation (min) 43 min    Activity Tolerance Patient tolerated treatment well    Behavior During Therapy WFL for tasks assessed/performed              Past Medical History:  Diagnosis Date   COVID    Depression    PTSD (post-traumatic stress disorder)    Past Surgical History:  Procedure Laterality Date   BREAST LUMPECTOMY Left    benign per patient   Patient Active Problem List   Diagnosis Date Noted   Medial epicondylitis of elbow, right 06/17/2023   Bicipital tendonitis of shoulder, right 06/17/2023   Spasms of the hands or feet 06/17/2023   Right wrist pain 09/30/2022   Ganglion cyst of dorsum of right wrist 09/30/2022   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 12/28/2019   Vitamin D  deficiency 12/23/2019   Depression    PTSD (post-traumatic stress disorder)     PCP: Theophilus Andrews, Tully GRADE, MD  REFERRING PROVIDER: Leonce Katz, DO  REFERRING DIAG: [phone] (ICD-10-CM) - Right elbow pain M77.01 (ICD-10-CM) - Medial epicondylitis of right elbow M25.519 (ICD-10-CM) - Arthralgia of shoulder, unspecified laterality  THERAPY DIAG:  Muscle weakness  Pain in right elbow  Right shoulder pain, unspecified chronicity  Medial epicondylitis of elbow, right  Rationale for Evaluation and Treatment: Rehabilitation  ONSET DATE: Last September (but had this injury in the past before)  SUBJECTIVE:                                                                                                                                                                                       SUBJECTIVE STATEMENT: States that the ice massage does seem to be helping.   Hand dominance: Right  PERTINENT HISTORY: S/P Right Ganglion Cyst removal on 10/16/2022, PTSD, History of right knee pain (possible right knee medial meniscal tear vs medial plical type syndrome following Dr Genelle)  PAIN: 10/20/23 Are you having pain? Yes: NPRS scale: 4/10 Pain location: right medial elbow and right shoulder Pain description: sharp Aggravating factors: movement, pushing, reaching, reaching overhead Relieving factors: medication, stretches   PRECAUTIONS: Other: Per documentation by Dr Leonce, no official restrictions, however, he does feel that repetitive motions and heavy  lifting do contribute to increased pain  RED FLAGS: None   WEIGHT BEARING RESTRICTIONS: No  FALLS:  Has patient fallen in last 6 months? 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 No specific internal derangement is identified to explain the patient's symptoms.  PATIENT SURVEYS :  Eval:  Quick Dash 40.91 09/16/23: 31.1% 10/26/2023:  Quick Dash 18.18%  COGNITION: Overall cognitive status: Within functional limits for tasks assessed     SENSATION: Patient denies  POSTURE: Rounded shoulders, forward flexed  UPPER EXTREMITY ROM:   Eval:  Pt has ROM WFL, however, has pain with right elbow flexion/extension and supination/pronation  UPPER EXTREMITY MMT:  Eval:   Right shoulder and  elbow strength of 4/5 Left shoulder/UE strength is 4+ to 5/5 grossly throughout Right grip strength of 28 lbs Left grip strength of 32 lbs Right key pinch strength is 10 pounds Left key pinch strength is 15 pounds  08/19/2023: Right grip strength of 28 lbs Left grip strength of 25 lbs  09/16/23:  Rt grip: 30#  Rt shoulder: flexion 4+5, abduction 4/5, IR 4/5, ER 4/5.  Rt elbow: flexion 4+/5, extension 4+/5-all with elbow pain  10/26/2023: Right UE strength grossly 4+/5 throughout.   SHOULDER SPECIAL TESTS: Impingement tests: Hawkins/Kennedy impingement test: negative  Rotator cuff assessment: Empty can test: negative    PALPATION:  Tender to palpation around right shoulder region and biceps tendon insertion. Tenderness with muscle spasms noted in right medial elbow region   TODAY'S TREATMENT:       DATE: 10/26/2023 Arm bike: level 1.2 x 6 minutes (3/3)-PT present to discuss progress  Standing shoulder horizontal abduction with red tband 2x15 Standing 3 way raises with 1# dumbbell x15 bil Wall clocks: blue loop x5 Rt and Lt Quick Dash 18.18% Wrist: flexion, radial deviation and extension with 1# weight x20 each  Biceps curl and hammer curl 3# 2x10 bil  Triceps extension with yellow band 2x10 Standing rows with yellow tband x15 Supination/pronation with dowel rod Grip strengthening with firm putty (provided to patient to take home) x15 bilat    DATE: 10/20/2023 Arm bike: level 1.2 x 6 minutes (3/3)-PT present to discuss progress  Standing shoulder horizontal abduction with red tband 2x8 Standing 3 way raises with 1# dumbbell 2x10 bil Wall clocks: blue loop x5 Rt and Lt Wrist: flexion, radial deviation and extension with 1# weight x20 each  Biceps curl and hammer curl 3# 2x10 bil  Triceps extension with yellow band 2x10 Ice massage with Cryostim to Rt medial triceps insertion  DATE: 10/08/2023 Arm bike: level 1.2x 6 minutes (3/3)-PT present to discuss progress  Standing shoulder horizontal abduction with red tband 2x8 Standing 3 way shoulder elevation with 1# dumbbell x10 Rt Standing lower trap lift off 2x10 Wrist: flexion, radial deviation and extension with 1# weight x20 each  Biceps curl and hammer curl 2# 2x10 bil  Triceps extension with yellow band 2x10 Ice massage with Cryostim to Rt medial triceps insertion- PT educated pt on how to do this at home   PATIENT EDUCATION: Education details: Issued HEP, ice massage (10/08/23) Person educated: Patient Education method: Explanation, Demonstration, and Handouts Education comprehension: verbalized understanding and returned demonstration  HOME EXERCISE PROGRAM: Access Code: [email or web address] URL: [email or web address] Date: 10/26/2023 Prepared by: Jarrell Cheyanna Strick  Exercises - Standing Ulnar Nerve Glide  - 1 x daily - 7 x weekly - 2 sets - 10 reps - Seated Wrist Extension Stretch  - 1 x daily - 7 x weekly - 2 reps - 20 sec hold - Wrist Prayer Stretch  - 1 x daily - 7 x weekly - 2 reps - 20 sec hold - Seated Scapular Retraction  - 1 x daily - 7 x weekly - 2 sets - 10 reps - Standing shoulder flexion wall slides  - 1 x daily - 7 x weekly - 2 sets - 10 reps - Standing Shoulder Internal Rotation with Anchored Resistance  - 1 x daily - 3 x weekly - 2 sets - 5-10 reps - Shoulder External Rotation with Anchored Resistance  - 1 x daily - 3 x weekly - 2 sets - 5-10 reps - Sleeper Stretch  - 1 x daily - 3 x weekly - 2 reps - 15-20 sec hold - Standing Shoulder Horizontal Adduction with Anchored Resistance  - 1 x daily - 3 x weekly - 2 sets - 5-10 reps - Bird Dog  - 1 x daily - 7 x weekly - 2 sets - 5-10 reps - Prone Alternating Arm and Leg Lifts  - 1 x daily - 7 x weekly - 2 sets - 10 reps - Bird Dog on Counter  - 1 x daily - 7 x weekly - 2 sets - 10 reps - Standing Shoulder  Horizontal Abduction with Resistance  - 1 x daily - 7 x weekly - 2 sets - 10 reps - Standing Tricep Extensions with Resistance  - 1 x daily - 7 x weekly - 2 sets - 10 reps - Putty Squeezes  - 1 x daily - 7 x weekly - 2 sets - 10 reps - Marble Pick Up from Putty  - 1 x daily - 7 x weekly - 2 sets - 10 reps  Patient Education - Ice Massage  ASSESSMENT:  CLINICAL IMPRESSION: Renalda presents to skilled PT reporting that she has been doing her exercises.  She states that she still has difficulty with some job related/repetitive tasks, but does report that she is able to pick up her cat with 2 hands without increased pain.  Patient with greatly improved score on DASH and has met all long term goals at this time.  Patient educated to continue HEP and provided with new handout for exercises.  Pt is ready to discharge from PT at this time with all goals being met to continue with HEP and follow up with MD.  Educated patient to notify this PT clinic when/if she is ready to begin the PT for her knee, as ordered by Dr Genelle, she verbalizes her understanding.  OBJECTIVE IMPAIRMENTS: decreased strength, impaired perceived functional ability, increased muscle spasms, impaired sensation, impaired UE functional use, postural dysfunction, and pain.   ACTIVITY LIMITATIONS: carrying, lifting, and sleeping  PARTICIPATION LIMITATIONS: cleaning, laundry, shopping, community activity, and occupation  PERSONAL FACTORS: Past/current experiences, Time since onset of injury/illness/exacerbation, and 1-2 comorbidities: Right wrist ganglion cyst removal in July 2024, right medial epicondylitis are also affecting patient's functional outcome.   REHAB POTENTIAL: Good  CLINICAL DECISION MAKING: Evolving/moderate complexity  EVALUATION COMPLEXITY: Moderate  GOALS: Goals reviewed with patient? Yes  SHORT TERM GOALS: Target date: 08/14/2023  Pt will be independent with initial HEP. Baseline: Goal status: MET on  08/11/2023  2.  Pt will report at least a 25% improvement in symptoms. Baseline:  Goal status: MET on 08/19/23   LONG TERM GOALS: Target date: 10/28/2023    Pt will be independent with advanced HEP. Baseline: hasn't been able to be consistent due to being sick (09/16/23) Goal status: MET  2.  Pt will increase right grip strength to at least 30 pounds to allow her to perform job related tasks. Baseline: 30 pounds Goal status: met  3.  Pt will increase right UE strength to at least 4+/5 to allow her to perform job related tasks with increased ease. Baseline: see above (09/16/23 Goal status: IN PROGRESS  4.  Patient will report no increase in pain with lifting her cat and with drawing. Baseline: 50% better-4-8/10 Rt UE pain with lifting cat, hand cramps with drawing (09/16/23) Goal status: MET States no pain if she uses 2 hands to lift her cat, but sometimes has pain if she uses one hand only    5.  Improve QuickDash to < or = to 20% to improve function    Baseline: 31.1%    Goal Status: Met on 10/26/23 (18.18%) PLAN: PT FREQUENCY: 1-2x/week  PT DURATION: 6 weeks  PLANNED INTERVENTIONS: 97164- PT Re-evaluation, 97110-Therapeutic exercises, 97530- Therapeutic activity, 97112- Neuromuscular re-education, 97535- Self Care, [id number]- Manual therapy, [id number]- Gait training, [phone]- Aquatic Therapy, [phone]- Electrical stimulation (unattended), [phone]- Electrical stimulation (manual), [id number]- Vasopneumatic device, [id number]- Ultrasound, [id number]- Traction (mechanical), [id number]- Ionotophoresis 4mg /ml Dexamethasone, Patient/Family education, Balance training, Taping, Dry Needling, Joint mobilization, Joint manipulation, Spinal manipulation, Spinal mobilization, Cryotherapy, Moist heat, Therapeutic exercises, Therapeutic activity, Neuromuscular re-education, and Self Care   PHYSICAL THERAPY DISCHARGE SUMMARY  Patient agrees to discharge. Patient goals were met. Patient is being discharged due to meeting the stated  rehab goals.     Jarrell Laming, PT, DPT 10/26/23, 2:11 PM  [facility] [address], [address] [place], [place] [address] Phone # [phone] Fax [phone]

## 2023-11-02 ENCOUNTER — Encounter: Payer: Self-pay | Admitting: Podiatry

## 2023-11-13 ENCOUNTER — Ambulatory Visit (HOSPITAL_BASED_OUTPATIENT_CLINIC_OR_DEPARTMENT_OTHER): Admitting: Orthopaedic Surgery

## 2023-11-13 DIAGNOSIS — M25561 Pain in right knee: Secondary | ICD-10-CM

## 2023-11-13 DIAGNOSIS — G8929 Other chronic pain: Secondary | ICD-10-CM | POA: Diagnosis not present

## 2023-11-13 NOTE — Progress Notes (Signed)
 Chief Complaint: Right knee pain     History of Present Illness:   11/13/2023: Presents today with continued right knee pain particular about the posterior medial hamstring.  She has not been able to establish with physical therapy yet  Natalie Leblanc is a 33 y.o. female presents today with ongoing persistent right knee pain for the last several months.  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  She has been experiencing medial based knee pain and making it very difficult to get through an entire day.  There is popping and clicking.  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    PMH/PSH/Family History/Social History/Meds/Allergies:    Past Medical History:  Diagnosis Date   COVID    Depression    PTSD (post-traumatic stress disorder)    Past Surgical History:  Procedure Laterality Date   BREAST LUMPECTOMY Left    benign per patient   Social History   Socioeconomic History   Marital status: Single    Spouse name: Not on file   Number of children: Not on file   Years of education: Not on file   Highest education level: 12th grade  Occupational History   Not on file  Tobacco Use   Smoking status: Never   Smokeless tobacco: Never  Vaping Use   Vaping status: Never Used  Substance and Sexual Activity   Alcohol use: Yes    Comment: occasional   Drug use: No   Sexual activity: Yes  Other Topics Concern   Not on file  Social History Narrative   Not on file   Social Drivers of Health   Financial Resource Strain: Low Risk  (07/30/2021)   Overall Financial Resource Strain (CARDIA)    Difficulty of Paying Living Expenses: Not very hard  Food Insecurity: No Food Insecurity (07/30/2021)   Hunger Vital Sign    Worried About Running Out of Food in the Last Year: Never true    Ran Out of Food in the Last Year: Never true   Transportation Needs: No Transportation Needs (07/30/2021)   PRAPARE - Administrator, Civil Service (Medical): No    Lack of Transportation (Non-Medical): No  Physical Activity: Unknown (07/30/2021)   Exercise Vital Sign    Days of Exercise per Week: Patient declined    Minutes of Exercise per Session: Not on file  Stress: Stress Concern Present (07/30/2021)   [employer] of Occupational Health - Occupational Stress Questionnaire    Feeling of Stress : Very much  Social Connections: Unknown (08/05/2021)   Received from [facility]   Social Network    Social Network: Not on file  Recent Concern: Social Connections - Socially Isolated (07/30/2021)   Social Connection and Isolation Panel    Frequency of Communication with Friends and Family: Twice a week    Frequency of Social Gatherings with Friends and Family: Once a week    Attends Religious Services: Never    Database administrator or Organizations: No    Attends Engineer, structural: Not on file    Marital Status: Never married   Family History  Problem Relation Age of Onset   Gestational diabetes Mother    Alcohol abuse Mother    Mental illness Mother    Eczema Sister    Depression Sister  Psychosis Maternal Aunt    Thyroid disease Sister    Depression Sister    Heart murmur Sister    Allergies  Allergen Reactions   Fish Allergy     GI upset   Mobic  [Meloxicam ]    Other Itching    Animal Dander   Current Outpatient Medications  Medication Sig Dispense Refill   hydrOXYzine (ATARAX) 25 MG tablet Take 25 mg by mouth at bedtime as needed.     Multiple Vitamin (MULTIVITAMIN) capsule Take 1 capsule by mouth daily.     sertraline (ZOLOFT) 100 MG tablet Take 100 mg by mouth daily.     No current facility-administered medications for this visit.   No results found.  Review of Systems:   A ROS was performed including pertinent positives and negatives as documented in the HPI.  Physical Exam  :   Constitutional: NAD and appears stated age Neurological: Alert and oriented Psych: Appropriate affect and cooperative There were no vitals taken for this visit.   Comprehensive Musculoskeletal Exam:    Right knee with tenderness about the posterior hamstring medially, positive McMurray.  Mild effusion.  Range of motion is -3 to 235 degrees.  Distal neurosensory exam is intact   Imaging:   Xray (4 views right knee): Normal    I personally reviewed and interpreted the radiographs.   Assessment and Plan:   33 y.o. female with evidence of possible right knee with an MRI which is well-appearing and symptoms consistent more with medial hamstring issues.  Given this I will plan to refer her to Dr. Burnetta for shockwave therapy of the medial hamstring as well as the pes tendon area.  I will plan to see her back as needed     I personally saw and evaluated the patient, and participated in the management and treatment plan.  Elspeth Parker, MD Attending Physician, Orthopedic Surgery  This document was dictated using Dragon voice recognition software. A reasonable attempt at proof reading has been made to minimize errors.

## 2023-12-23 ENCOUNTER — Encounter: Payer: Self-pay | Admitting: Internal Medicine

## 2023-12-23 DIAGNOSIS — Z9109 Other allergy status, other than to drugs and biological substances: Secondary | ICD-10-CM

## 2024-01-17 NOTE — Progress Notes (Unsigned)
 New Patient Note  RE: Natalie Leblanc MRN: [id number] DOB: September 06, 1990 Date of Office Visit: 01/18/2024  Consult requested by: Theophilus Andrews, Jonna* Primary care provider: Theophilus Andrews, Tully GRADE, MD  Chief Complaint: No chief complaint on file.  History of Present Illness: I had the pleasure of seeing Natalie Leblanc for initial evaluation at the [facility] on 01/18/2024. She is a 33 y.o. female, who is referred here by Theophilus Andrews, Estel* for the evaluation of ***.  Discussed the use of AI scribe software for clinical note transcription with the patient, who gave verbal consent to proceed.  History of Present Illness             ***  Assessment and Plan: [name] is a 33 y.o. female with: ***  Assessment and Plan               No follow-ups on file.  No orders of the defined types were placed in this encounter.  Lab Orders  No laboratory test(s) ordered today    Other allergy screening: Asthma: {Blank single:19197::yes,no} Rhino conjunctivitis: {Blank single:19197::yes,no} Food allergy: {Blank single:19197::yes,no} Medication allergy: {Blank single:19197::yes,no} Hymenoptera allergy: {Blank single:19197::yes,no} Urticaria: {Blank single:19197::yes,no} Eczema:{Blank single:19197::yes,no} History of recurrent infections suggestive of immunodeficency: {Blank single:19197::yes,no}  Diagnostics: Spirometry:  Tracings reviewed. Her effort: {Blank single:19197::Good reproducible efforts.,It was hard to get consistent efforts and there is a question as to whether this reflects a maximal maneuver.,Poor effort, data can not be interpreted.} FVC: ***L FEV1: ***L, ***% predicted FEV1/FVC ratio: ***% Interpretation: {Blank single:19197::Spirometry consistent with mild obstructive disease,Spirometry consistent with moderate obstructive disease,Spirometry consistent with severe obstructive  disease,Spirometry consistent with possible restrictive disease,Spirometry consistent with mixed obstructive and restrictive disease,Spirometry uninterpretable due to technique,Spirometry consistent with normal pattern,No overt abnormalities noted given today's efforts}.  Please see scanned spirometry results for details.  Skin Testing: {Blank single:19197::Select foods,Environmental allergy panel,Environmental allergy panel and select foods,Food allergy panel,None,Deferred due to recent antihistamines use}. *** Results discussed with patient/family.   Past Medical History: Patient Active Problem List   Diagnosis Date Noted  . Medial epicondylitis of elbow, right 06/17/2023  . Bicipital tendonitis of shoulder, right 06/17/2023  . Spasms of the hands or feet 06/17/2023  . Right wrist pain 09/30/2022  . Ganglion cyst of dorsum of right wrist 09/30/2022  . 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 12/28/2019  . Vitamin D  deficiency 12/23/2019  . Depression   . PTSD (post-traumatic stress disorder)    Past Medical History:  Diagnosis Date  . COVID   . Depression   . PTSD (post-traumatic stress disorder)    Past Surgical History: Past Surgical History:  Procedure Laterality Date  . BREAST LUMPECTOMY Left    benign per patient   Medication List:  Current Outpatient Medications  Medication Sig Dispense Refill  . hydrOXYzine (ATARAX) 25 MG tablet Take 25 mg by mouth at bedtime as needed.    . Multiple Vitamin (MULTIVITAMIN) capsule Take 1 capsule by mouth daily.    . sertraline (ZOLOFT) 100 MG tablet Take 100 mg by mouth daily.     No current facility-administered medications for this visit.   Allergies: Allergies  Allergen Reactions  . Fish Allergy     GI upset  . Mobic  [Meloxicam ]   . Other Itching    Animal Dander   Social History: Social History   Socioeconomic History  . Marital status: Single    Spouse name: Not on file  . Number  of children:  Not on file  . Years of education: Not on file  . Highest education level: 12th grade  Occupational History  . Not on file  Tobacco Use  . Smoking status: Never  . Smokeless tobacco: Never  Vaping Use  . Vaping status: Never Used  Substance and Sexual Activity  . Alcohol use: Yes    Comment: occasional  . Drug use: No  . Sexual activity: Yes  Other Topics Concern  . Not on file  Social History Narrative  . Not on file   Social Drivers of Health   Financial Resource Strain: Low Risk  (07/30/2021)   Overall Financial Resource Strain (CARDIA)   . Difficulty of Paying Living Expenses: Not very hard  Food Insecurity: No Food Insecurity (07/30/2021)   Hunger Vital Sign   . Worried About Programme researcher, broadcasting/film/video in the Last Year: Never true   . Ran Out of Food in the Last Year: Never true  Transportation Needs: No Transportation Needs (07/30/2021)   PRAPARE - Transportation   . Lack of Transportation (Medical): No   . Lack of Transportation (Non-Medical): No  Physical Activity: Unknown (07/30/2021)   Exercise Vital Sign   . Days of Exercise per Week: Patient declined   . Minutes of Exercise per Session: Not on file  Stress: Stress Concern Present (07/30/2021)   [employer] of Occupational Health - Occupational Stress Questionnaire   . Feeling of Stress : Very much  Social Connections: Unknown (08/05/2021)   Received from [facility]   Social Network   . Social Network: Not on file  Recent Concern: Social Connections - Socially Isolated (07/30/2021)   Social Connection and Isolation Panel   . Frequency of Communication with Friends and Family: Twice a week   . Frequency of Social Gatherings with Friends and Family: Once a week   . Attends Religious Services: Never   . Active Member of Clubs or Organizations: No   . Attends Banker Meetings: Not on file   . Marital Status: Never married   Lives in a ***. Smoking: *** Occupation: ***  Environmental  HistorySurveyor, minerals in the house: Network engineer in the family room: {Blank single:19197::yes,no} Carpet in the bedroom: {Blank single:19197::yes,no} Heating: {Blank single:19197::electric,gas,heat pump} Cooling: {Blank single:19197::central,window,heat pump} Pet: {Blank single:19197::yes ***,no}  Family History: Family History  Problem Relation Age of Onset  . Gestational diabetes Mother   . Alcohol abuse Mother   . Mental illness Mother   . Eczema Sister   . Depression Sister   . Psychosis Maternal Aunt   . Thyroid disease Sister   . Depression Sister   . Heart murmur Sister    Problem                               Relation Asthma                                   *** Eczema                                *** Food allergy                          *** Allergic rhino conjunctivitis     ***  Review of  Systems  Constitutional:  Negative for appetite change, chills, fever and unexpected weight change.  HENT:  Negative for congestion and rhinorrhea.   Eyes:  Negative for itching.  Respiratory:  Negative for cough, chest tightness, shortness of breath and wheezing.   Cardiovascular:  Negative for chest pain.  Gastrointestinal:  Negative for abdominal pain.  Genitourinary:  Negative for difficulty urinating.  Skin:  Negative for rash.  Neurological:  Negative for headaches.    Objective: There were no vitals taken for this visit. There is no height or weight on file to calculate BMI. Physical Exam Vitals and nursing note reviewed.  Constitutional:      Appearance: Normal appearance. She is well-developed.  HENT:     Head: Normocephalic and atraumatic.     Right Ear: Tympanic membrane and external ear normal.     Left Ear: Tympanic membrane and external ear normal.     Nose: Nose normal.     Mouth/Throat:     Mouth: Mucous membranes are moist.     Pharynx: Oropharynx is clear.  Eyes:     Conjunctiva/sclera:  Conjunctivae normal.  Cardiovascular:     Rate and Rhythm: Normal rate and regular rhythm.     Heart sounds: Normal heart sounds. No murmur heard.    No friction rub. No gallop.  Pulmonary:     Effort: Pulmonary effort is normal.     Breath sounds: Normal breath sounds. No wheezing, rhonchi or rales.  Musculoskeletal:     Cervical back: Neck supple.  Skin:    General: Skin is warm.     Findings: No rash.  Neurological:     Mental Status: She is alert and oriented to person, place, and time.  Psychiatric:        Behavior: Behavior normal.   The plan was reviewed with the patient/family, and all questions/concerned were addressed.  It was my pleasure to see Azlin today and participate in her care. Please feel free to contact me with any questions or concerns.  Sincerely,  Orlan Cramp, DO Allergy & Immunology  [facility]  [place] office: [phone] [facility] office: [phone]

## 2024-01-18 ENCOUNTER — Ambulatory Visit (INDEPENDENT_AMBULATORY_CARE_PROVIDER_SITE_OTHER): Admitting: Allergy

## 2024-01-18 ENCOUNTER — Other Ambulatory Visit: Payer: Self-pay

## 2024-01-18 ENCOUNTER — Encounter: Payer: Self-pay | Admitting: Allergy

## 2024-01-18 VITALS — BP 100/70 | HR 88 | Temp 98.4°F | Ht 60.0 in | Wt 103.6 lb

## 2024-01-18 DIAGNOSIS — R198 Other specified symptoms and signs involving the digestive system and abdomen: Secondary | ICD-10-CM

## 2024-01-18 DIAGNOSIS — J3089 Other allergic rhinitis: Secondary | ICD-10-CM

## 2024-01-18 DIAGNOSIS — R519 Headache, unspecified: Secondary | ICD-10-CM | POA: Diagnosis not present

## 2024-01-18 NOTE — Patient Instructions (Addendum)
 Work situation I'm not sure what your trigger is and your symptoms are not typical of an allergic reaction. You may have some intolerances which there are no good tests for. If you have specific foods that you want to be tested for then please come back for skin testing. I gave you the sheet of foods that we have available for skin testing. I don't have testing for teas or protein powders or chemicals.  Recommend to keep wearing a mask at work - maybe even a higher grade mask such an N-95.  Allergy: food allergy is when you have eaten a food, developed an allergic reaction after eating the food and have IgE to the food (positive food testing either by skin testing or blood testing).  Food allergy could lead to life threatening symptoms Sensitivity: occurs when you have IgE to a food (positive food testing either by skin testing or blood testing) but is a food you eat without any issues.  This is not an allergy and we recommend keeping the food in the diet Intolerance: this is when you have negative testing by either skin testing or blood testing thus not allergic but the food causes symptoms (like belly pain, bloating, diarrhea etc) with ingestion.  These foods should be avoided to prevent symptoms.     Migraines/headaches Recommend seeing your neurologist again.  GI symptoms Monitor symptoms. Consider seeing a GI physician.  Environmental allergies Based on clinica history - probably has tree pollen and pet dander allergies. See below for environmental control measures. Consider testing in future if interested. Use over the counter antihistamines such as Zyrtec (cetirizine), Claritin (loratadine), Allegra (fexofenadine), or Xyzal (levocetirizine) daily as needed. May take twice a day during allergy flares. May switch antihistamines every few months.  Follow up as needed.   Skin testing instructions:  Make sure you don't take any antihistamines for 3 days before the skin testing  appointment. Don't put any lotion on the back and arms on the day of testing.  Must be in good health and not ill. No vaccines/injections/antibiotics within the past 7 days.  Plan on being here for 30-60 minutes.   Reducing Pollen Exposure Pollen seasons: trees (spring), grass (summer) and ragweed/weeds (fall). Keep windows closed in your home and car to lower pollen exposure.  Install air conditioning in the bedroom and throughout the house if possible.  Avoid going out in dry windy days - especially early morning. Pollen counts are highest between 5 - 10 AM and on dry, hot and windy days.  Save outside activities for late afternoon or after a heavy rain, when pollen levels are lower.  Avoid mowing of grass if you have grass pollen allergy. Be aware that pollen can also be transported indoors on people and pets.  Dry your clothes in an automatic dryer rather than hanging them outside where they might collect pollen.  Rinse hair and eyes before bedtime.  Pet Allergen Avoidance: Contrary to popular opinion, there are no "hypoallergenic" breeds of dogs or cats. That is because people are not allergic to an animal's hair, but to an allergen found in the animal's saliva, dander (dead skin flakes) or urine. Pet allergy symptoms typically occur within minutes. For some people, symptoms can build up and become most severe 8 to 12 hours after contact with the animal. People with severe allergies can experience reactions in public places if dander has been transported on the pet owners' clothing. Keeping an animal outdoors is only a partial solution, since  homes with pets in the yard still have higher concentrations of animal allergens. Before getting a pet, ask your allergist to determine if you are allergic to animals. If your pet is already considered part of your family, try to minimize contact and keep the pet out of the bedroom and other rooms where you spend a great deal of time. As with dust  mites, vacuum carpets often or replace carpet with a hardwood floor, tile or linoleum. High-efficiency particulate air (HEPA) cleaners can reduce allergen levels over time. While dander and saliva are the source of cat and dog allergens, urine is the source of allergens from rabbits, hamsters, mice and israel pigs; so ask a non-allergic family member to clean the animal's cage. If you have a pet allergy, talk to your allergist about the potential for allergy immunotherapy (allergy shots). This strategy can often provide long-term relief.

## 2024-02-08 ENCOUNTER — Encounter: Payer: Self-pay | Admitting: Radiology
